# Patient Record
Sex: Male | Born: 1935 | Race: Black or African American | Hispanic: No | Smoking: Former smoker
Health system: Southern US, Community
[De-identification: ages and names within clinical notes are randomized; demographics above are authoritative.]

## PROBLEM LIST (undated history)

## (undated) DIAGNOSIS — G5600 Carpal tunnel syndrome, unspecified upper limb: Secondary | ICD-10-CM

## (undated) DIAGNOSIS — I5022 Chronic systolic (congestive) heart failure: Secondary | ICD-10-CM

## (undated) DIAGNOSIS — Z8719 Personal history of other diseases of the digestive system: Secondary | ICD-10-CM

## (undated) DIAGNOSIS — Z8739 Personal history of other diseases of the musculoskeletal system and connective tissue: Secondary | ICD-10-CM

## (undated) DIAGNOSIS — E785 Hyperlipidemia, unspecified: Secondary | ICD-10-CM

## (undated) DIAGNOSIS — J309 Allergic rhinitis, unspecified: Secondary | ICD-10-CM

## (undated) DIAGNOSIS — J45909 Unspecified asthma, uncomplicated: Secondary | ICD-10-CM

## (undated) DIAGNOSIS — S43006A Unspecified dislocation of unspecified shoulder joint, initial encounter: Secondary | ICD-10-CM

## (undated) DIAGNOSIS — I509 Heart failure, unspecified: Secondary | ICD-10-CM

## (undated) DIAGNOSIS — I10 Essential (primary) hypertension: Secondary | ICD-10-CM

## (undated) DIAGNOSIS — C7951 Secondary malignant neoplasm of bone: Secondary | ICD-10-CM

## (undated) DIAGNOSIS — E669 Obesity, unspecified: Secondary | ICD-10-CM

## (undated) DIAGNOSIS — Y92009 Unspecified place in unspecified non-institutional (private) residence as the place of occurrence of the external cause: Secondary | ICD-10-CM

## (undated) DIAGNOSIS — M1990 Unspecified osteoarthritis, unspecified site: Secondary | ICD-10-CM

## (undated) DIAGNOSIS — Z978 Presence of other specified devices: Secondary | ICD-10-CM

## (undated) DIAGNOSIS — I2510 Atherosclerotic heart disease of native coronary artery without angina pectoris: Secondary | ICD-10-CM

## (undated) DIAGNOSIS — C61 Malignant neoplasm of prostate: Secondary | ICD-10-CM

## (undated) DIAGNOSIS — E119 Type 2 diabetes mellitus without complications: Secondary | ICD-10-CM

## (undated) DIAGNOSIS — N183 Chronic kidney disease, stage 3 unspecified: Secondary | ICD-10-CM

## (undated) DIAGNOSIS — K760 Fatty (change of) liver, not elsewhere classified: Secondary | ICD-10-CM

## (undated) DIAGNOSIS — W19XXXA Unspecified fall, initial encounter: Secondary | ICD-10-CM

## (undated) DIAGNOSIS — D649 Anemia, unspecified: Secondary | ICD-10-CM

## (undated) DIAGNOSIS — I219 Acute myocardial infarction, unspecified: Secondary | ICD-10-CM

## (undated) DIAGNOSIS — Z960 Presence of urogenital implants: Secondary | ICD-10-CM

## (undated) HISTORY — DX: Essential (primary) hypertension: I10

## (undated) HISTORY — DX: Hyperlipidemia, unspecified: E78.5

## (undated) HISTORY — DX: Unspecified dislocation of unspecified shoulder joint, initial encounter: S43.006A

## (undated) HISTORY — DX: Chronic systolic (congestive) heart failure: I50.22

## (undated) HISTORY — DX: Fatty (change of) liver, not elsewhere classified: K76.0

## (undated) HISTORY — DX: Atherosclerotic heart disease of native coronary artery without angina pectoris: I25.10

## (undated) HISTORY — DX: Allergic rhinitis, unspecified: J30.9

## (undated) HISTORY — PX: TRANSURETHRAL RESECTION OF PROSTATE: SHX73

## (undated) HISTORY — DX: Obesity, unspecified: E66.9

## (undated) HISTORY — DX: Unspecified osteoarthritis, unspecified site: M19.90

## (undated) HISTORY — PX: CATARACT EXTRACTION W/ INTRAOCULAR LENS  IMPLANT, BILATERAL: SHX1307

## (undated) HISTORY — DX: Carpal tunnel syndrome, unspecified upper limb: G56.00

---

## 1968-11-28 DIAGNOSIS — S43006A Unspecified dislocation of unspecified shoulder joint, initial encounter: Secondary | ICD-10-CM

## 1968-11-28 HISTORY — DX: Unspecified dislocation of unspecified shoulder joint, initial encounter: S43.006A

## 1972-11-28 HISTORY — PX: NERVE REPAIR: SHX2083

## 2002-11-08 ENCOUNTER — Encounter: Admission: RE | Admit: 2002-11-08 | Discharge: 2002-11-08 | Payer: Self-pay | Admitting: Family Medicine

## 2002-11-08 ENCOUNTER — Encounter: Payer: Self-pay | Admitting: Family Medicine

## 2002-11-13 ENCOUNTER — Encounter: Admission: RE | Admit: 2002-11-13 | Discharge: 2002-12-12 | Payer: Self-pay | Admitting: Family Medicine

## 2005-01-20 ENCOUNTER — Encounter: Admission: RE | Admit: 2005-01-20 | Discharge: 2005-04-20 | Payer: Self-pay | Admitting: Family Medicine

## 2005-02-07 ENCOUNTER — Ambulatory Visit (HOSPITAL_COMMUNITY): Admission: RE | Admit: 2005-02-07 | Discharge: 2005-02-07 | Payer: Self-pay | Admitting: Family Medicine

## 2005-11-28 DIAGNOSIS — I219 Acute myocardial infarction, unspecified: Secondary | ICD-10-CM

## 2005-11-28 HISTORY — DX: Acute myocardial infarction, unspecified: I21.9

## 2006-07-07 ENCOUNTER — Ambulatory Visit: Payer: Self-pay | Admitting: Cardiology

## 2006-07-07 ENCOUNTER — Inpatient Hospital Stay (HOSPITAL_COMMUNITY): Admission: EM | Admit: 2006-07-07 | Discharge: 2006-07-13 | Payer: Self-pay | Admitting: Emergency Medicine

## 2006-07-07 DIAGNOSIS — I252 Old myocardial infarction: Secondary | ICD-10-CM

## 2006-07-08 ENCOUNTER — Encounter: Payer: Self-pay | Admitting: Cardiology

## 2006-07-11 ENCOUNTER — Ambulatory Visit: Payer: Self-pay | Admitting: Gastroenterology

## 2006-07-14 ENCOUNTER — Ambulatory Visit: Payer: Self-pay | Admitting: Internal Medicine

## 2006-07-14 ENCOUNTER — Inpatient Hospital Stay (HOSPITAL_COMMUNITY): Admission: EM | Admit: 2006-07-14 | Discharge: 2006-07-22 | Payer: Self-pay | Admitting: Emergency Medicine

## 2006-07-14 ENCOUNTER — Ambulatory Visit: Payer: Self-pay | Admitting: Cardiology

## 2006-07-15 ENCOUNTER — Encounter (INDEPENDENT_AMBULATORY_CARE_PROVIDER_SITE_OTHER): Payer: Self-pay | Admitting: *Deleted

## 2006-07-18 ENCOUNTER — Ambulatory Visit: Payer: Self-pay | Admitting: Oncology

## 2006-07-27 ENCOUNTER — Ambulatory Visit: Payer: Self-pay | Admitting: Cardiology

## 2006-08-14 ENCOUNTER — Ambulatory Visit: Payer: Self-pay

## 2006-08-15 ENCOUNTER — Ambulatory Visit: Payer: Self-pay | Admitting: Gastroenterology

## 2006-09-01 ENCOUNTER — Ambulatory Visit: Payer: Self-pay | Admitting: Gastroenterology

## 2006-11-08 ENCOUNTER — Encounter (INDEPENDENT_AMBULATORY_CARE_PROVIDER_SITE_OTHER): Payer: Self-pay | Admitting: *Deleted

## 2006-11-08 ENCOUNTER — Inpatient Hospital Stay (HOSPITAL_COMMUNITY): Admission: RE | Admit: 2006-11-08 | Discharge: 2006-11-10 | Payer: Self-pay | Admitting: Urology

## 2006-11-23 ENCOUNTER — Ambulatory Visit (HOSPITAL_COMMUNITY): Admission: RE | Admit: 2006-11-23 | Discharge: 2006-11-23 | Payer: Self-pay | Admitting: Urology

## 2006-12-12 ENCOUNTER — Ambulatory Visit: Admission: RE | Admit: 2006-12-12 | Discharge: 2007-03-01 | Payer: Self-pay | Admitting: Radiation Oncology

## 2007-03-22 ENCOUNTER — Ambulatory Visit: Admission: RE | Admit: 2007-03-22 | Discharge: 2007-06-13 | Payer: Self-pay | Admitting: Radiation Oncology

## 2007-07-03 ENCOUNTER — Ambulatory Visit: Admission: RE | Admit: 2007-07-03 | Discharge: 2007-08-21 | Payer: Self-pay | Admitting: Radiation Oncology

## 2008-01-10 ENCOUNTER — Ambulatory Visit: Payer: Self-pay | Admitting: Cardiology

## 2008-02-07 ENCOUNTER — Encounter: Admission: RE | Admit: 2008-02-07 | Discharge: 2008-02-07 | Payer: Self-pay | Admitting: Podiatry

## 2008-03-06 ENCOUNTER — Encounter: Admission: RE | Admit: 2008-03-06 | Discharge: 2008-03-06 | Payer: Self-pay | Admitting: Interventional Radiology

## 2008-09-04 ENCOUNTER — Ambulatory Visit (HOSPITAL_COMMUNITY): Admission: RE | Admit: 2008-09-04 | Discharge: 2008-09-04 | Payer: Self-pay | Admitting: Urology

## 2008-12-08 DIAGNOSIS — E119 Type 2 diabetes mellitus without complications: Secondary | ICD-10-CM

## 2008-12-08 DIAGNOSIS — E669 Obesity, unspecified: Secondary | ICD-10-CM

## 2008-12-08 DIAGNOSIS — I10 Essential (primary) hypertension: Secondary | ICD-10-CM

## 2008-12-08 DIAGNOSIS — E785 Hyperlipidemia, unspecified: Secondary | ICD-10-CM

## 2008-12-08 DIAGNOSIS — N259 Disorder resulting from impaired renal tubular function, unspecified: Secondary | ICD-10-CM | POA: Insufficient documentation

## 2008-12-08 HISTORY — DX: Essential (primary) hypertension: I10

## 2009-01-22 ENCOUNTER — Encounter (INDEPENDENT_AMBULATORY_CARE_PROVIDER_SITE_OTHER): Payer: Self-pay

## 2009-05-04 ENCOUNTER — Ambulatory Visit: Payer: Self-pay | Admitting: Oncology

## 2009-05-06 ENCOUNTER — Encounter: Payer: Self-pay | Admitting: Cardiology

## 2009-05-06 LAB — CBC WITH DIFFERENTIAL/PLATELET
Basophils Absolute: 0 10*3/uL (ref 0.0–0.1)
Eosinophils Absolute: 0.2 10*3/uL (ref 0.0–0.5)
HCT: 36.3 % — ABNORMAL LOW (ref 38.4–49.9)
HGB: 12.3 g/dL — ABNORMAL LOW (ref 13.0–17.1)
MONO#: 0.3 10*3/uL (ref 0.1–0.9)
NEUT%: 64.6 % (ref 39.0–75.0)
WBC: 5.1 10*3/uL (ref 4.0–10.3)
lymph#: 1.3 10*3/uL (ref 0.9–3.3)

## 2009-05-06 LAB — PSA: PSA: 9.1 ng/mL — ABNORMAL HIGH (ref 0.10–4.00)

## 2009-05-06 LAB — COMPREHENSIVE METABOLIC PANEL
ALT: 21 U/L (ref 0–53)
BUN: 38 mg/dL — ABNORMAL HIGH (ref 6–23)
CO2: 22 mEq/L (ref 19–32)
Calcium: 9.7 mg/dL (ref 8.4–10.5)
Chloride: 109 mEq/L (ref 96–112)
Creatinine, Ser: 1.94 mg/dL — ABNORMAL HIGH (ref 0.40–1.50)
Glucose, Bld: 210 mg/dL — ABNORMAL HIGH (ref 70–99)

## 2009-05-25 ENCOUNTER — Encounter (INDEPENDENT_AMBULATORY_CARE_PROVIDER_SITE_OTHER): Payer: Self-pay | Admitting: *Deleted

## 2009-07-10 ENCOUNTER — Ambulatory Visit: Payer: Self-pay | Admitting: Oncology

## 2009-07-14 ENCOUNTER — Telehealth: Payer: Self-pay | Admitting: Gastroenterology

## 2009-07-30 ENCOUNTER — Encounter: Payer: Self-pay | Admitting: Cardiology

## 2009-07-30 LAB — CBC WITH DIFFERENTIAL/PLATELET
BASO%: 0.6 % (ref 0.0–2.0)
Eosinophils Absolute: 0.2 10*3/uL (ref 0.0–0.5)
HCT: 35.1 % — ABNORMAL LOW (ref 38.4–49.9)
LYMPH%: 35.4 % (ref 14.0–49.0)
MCHC: 33.1 g/dL (ref 32.0–36.0)
MCV: 85.5 fL (ref 79.3–98.0)
MONO#: 0.4 10*3/uL (ref 0.1–0.9)
MONO%: 8 % (ref 0.0–14.0)
NEUT%: 52.4 % (ref 39.0–75.0)
Platelets: 203 10*3/uL (ref 140–400)
WBC: 5 10*3/uL (ref 4.0–10.3)

## 2009-07-30 LAB — PSA: PSA: 2.21 ng/mL (ref 0.10–4.00)

## 2009-07-30 LAB — COMPREHENSIVE METABOLIC PANEL
CO2: 20 mEq/L (ref 19–32)
Creatinine, Ser: 2.43 mg/dL — ABNORMAL HIGH (ref 0.40–1.50)
Glucose, Bld: 255 mg/dL — ABNORMAL HIGH (ref 70–99)
Total Bilirubin: 0.5 mg/dL (ref 0.3–1.2)

## 2009-10-27 ENCOUNTER — Ambulatory Visit: Payer: Self-pay | Admitting: Oncology

## 2009-11-24 ENCOUNTER — Encounter: Payer: Self-pay | Admitting: Gastroenterology

## 2009-12-07 ENCOUNTER — Ambulatory Visit: Payer: Self-pay | Admitting: Oncology

## 2009-12-14 ENCOUNTER — Telehealth: Payer: Self-pay | Admitting: Gastroenterology

## 2010-01-08 ENCOUNTER — Ambulatory Visit: Payer: Self-pay | Admitting: Oncology

## 2010-01-12 ENCOUNTER — Encounter: Payer: Self-pay | Admitting: Cardiology

## 2010-01-12 LAB — CBC WITH DIFFERENTIAL/PLATELET
Basophils Absolute: 0 10*3/uL (ref 0.0–0.1)
EOS%: 2.3 % (ref 0.0–7.0)
LYMPH%: 36.3 % (ref 14.0–49.0)
MCH: 27.8 pg (ref 27.2–33.4)
MCV: 84.9 fL (ref 79.3–98.0)
MONO%: 8 % (ref 0.0–14.0)
Platelets: 236 10*3/uL (ref 140–400)
RBC: 4.5 10*6/uL (ref 4.20–5.82)
RDW: 15.9 % — ABNORMAL HIGH (ref 11.0–14.6)

## 2010-01-12 LAB — COMPREHENSIVE METABOLIC PANEL
AST: 13 U/L (ref 0–37)
Albumin: 4.1 g/dL (ref 3.5–5.2)
Alkaline Phosphatase: 81 U/L (ref 39–117)
BUN: 36 mg/dL — ABNORMAL HIGH (ref 6–23)
Potassium: 4.9 mEq/L (ref 3.5–5.3)
Sodium: 140 mEq/L (ref 135–145)
Total Bilirubin: 0.5 mg/dL (ref 0.3–1.2)

## 2010-02-08 ENCOUNTER — Ambulatory Visit: Payer: Self-pay | Admitting: Oncology

## 2010-02-19 ENCOUNTER — Ambulatory Visit (HOSPITAL_COMMUNITY): Admission: RE | Admit: 2010-02-19 | Discharge: 2010-02-19 | Payer: Self-pay | Admitting: Oncology

## 2010-03-10 ENCOUNTER — Ambulatory Visit: Payer: Self-pay | Admitting: Oncology

## 2010-03-12 ENCOUNTER — Encounter: Payer: Self-pay | Admitting: Cardiology

## 2010-04-19 ENCOUNTER — Ambulatory Visit: Payer: Self-pay | Admitting: Internal Medicine

## 2010-04-19 ENCOUNTER — Inpatient Hospital Stay (HOSPITAL_COMMUNITY): Admission: EM | Admit: 2010-04-19 | Discharge: 2010-04-22 | Payer: Self-pay | Admitting: Emergency Medicine

## 2010-04-22 ENCOUNTER — Encounter (INDEPENDENT_AMBULATORY_CARE_PROVIDER_SITE_OTHER): Payer: Self-pay | Admitting: Internal Medicine

## 2010-05-27 ENCOUNTER — Ambulatory Visit: Payer: Self-pay | Admitting: Oncology

## 2010-07-02 ENCOUNTER — Ambulatory Visit: Payer: Self-pay | Admitting: Oncology

## 2010-07-06 ENCOUNTER — Encounter: Payer: Self-pay | Admitting: Cardiology

## 2010-07-06 LAB — COMPREHENSIVE METABOLIC PANEL
ALT: 14 U/L (ref 0–53)
AST: 16 U/L (ref 0–37)
Albumin: 4.2 g/dL (ref 3.5–5.2)
Calcium: 9.7 mg/dL (ref 8.4–10.5)
Chloride: 109 mEq/L (ref 96–112)
Potassium: 5 mEq/L (ref 3.5–5.3)
Sodium: 138 mEq/L (ref 135–145)
Total Protein: 6.6 g/dL (ref 6.0–8.3)

## 2010-07-06 LAB — CBC WITH DIFFERENTIAL/PLATELET
BASO%: 0.5 % (ref 0.0–2.0)
Basophils Absolute: 0 10*3/uL (ref 0.0–0.1)
EOS%: 4.7 % (ref 0.0–7.0)
HGB: 10.8 g/dL — ABNORMAL LOW (ref 13.0–17.1)
MCH: 27.8 pg (ref 27.2–33.4)
MCHC: 32.9 g/dL (ref 32.0–36.0)
RBC: 3.87 10*6/uL — ABNORMAL LOW (ref 4.20–5.82)
RDW: 16.3 % — ABNORMAL HIGH (ref 11.0–14.6)
lymph#: 2.1 10*3/uL (ref 0.9–3.3)

## 2010-08-30 ENCOUNTER — Inpatient Hospital Stay (HOSPITAL_COMMUNITY): Admission: EM | Admit: 2010-08-30 | Discharge: 2010-09-04 | Payer: Self-pay | Admitting: Emergency Medicine

## 2010-09-08 ENCOUNTER — Ambulatory Visit: Payer: Self-pay | Admitting: Oncology

## 2010-12-05 ENCOUNTER — Inpatient Hospital Stay (HOSPITAL_COMMUNITY)
Admission: EM | Admit: 2010-12-05 | Discharge: 2010-12-10 | Payer: Self-pay | Source: Home / Self Care | Attending: Internal Medicine | Admitting: Internal Medicine

## 2010-12-13 LAB — GLUCOSE, CAPILLARY
Glucose-Capillary: 300 mg/dL — ABNORMAL HIGH (ref 70–99)
Glucose-Capillary: 547 mg/dL — ABNORMAL HIGH (ref 70–99)
Glucose-Capillary: 517 mg/dL — ABNORMAL HIGH (ref 70–99)
Glucose-Capillary: 303 mg/dL — ABNORMAL HIGH (ref 70–99)
Glucose-Capillary: 132 mg/dL — ABNORMAL HIGH (ref 70–99)
Glucose-Capillary: 107 mg/dL — ABNORMAL HIGH (ref 70–99)
Glucose-Capillary: 183 mg/dL — ABNORMAL HIGH (ref 70–99)
Glucose-Capillary: 100 mg/dL — ABNORMAL HIGH (ref 70–99)
Glucose-Capillary: 182 mg/dL — ABNORMAL HIGH (ref 70–99)
Glucose-Capillary: 143 mg/dL — ABNORMAL HIGH (ref 70–99)
Glucose-Capillary: 179 mg/dL — ABNORMAL HIGH (ref 70–99)
Glucose-Capillary: 171 mg/dL — ABNORMAL HIGH (ref 70–99)
Glucose-Capillary: 259 mg/dL — ABNORMAL HIGH (ref 70–99)
Glucose-Capillary: 70 mg/dL (ref 70–99)
Glucose-Capillary: 269 mg/dL — ABNORMAL HIGH (ref 70–99)
Glucose-Capillary: 341 mg/dL — ABNORMAL HIGH (ref 70–99)
Glucose-Capillary: 402 mg/dL — ABNORMAL HIGH (ref 70–99)
Glucose-Capillary: 103 mg/dL — ABNORMAL HIGH (ref 70–99)
Glucose-Capillary: 222 mg/dL — ABNORMAL HIGH (ref 70–99)
Glucose-Capillary: 127 mg/dL — ABNORMAL HIGH (ref 70–99)
Glucose-Capillary: 356 mg/dL — ABNORMAL HIGH (ref 70–99)

## 2010-12-13 LAB — URINALYSIS, ROUTINE W REFLEX MICROSCOPIC
Bilirubin Urine: NEGATIVE
Ketones, ur: NEGATIVE mg/dL
Leukocytes, UA: NEGATIVE
Nitrite: NEGATIVE
Protein, ur: NEGATIVE mg/dL
Specific Gravity, Urine: 1.014 (ref 1.005–1.030)
Urine Glucose, Fasting: NEGATIVE mg/dL
Urobilinogen, UA: 0.2 mg/dL (ref 0.0–1.0)
pH: 5 (ref 5.0–8.0)

## 2010-12-13 LAB — CBC
HCT: 27.2 % — ABNORMAL LOW (ref 39.0–52.0)
HCT: 26 % — ABNORMAL LOW (ref 39.0–52.0)
HCT: 27 % — ABNORMAL LOW (ref 39.0–52.0)
Hemoglobin: 8.8 g/dL — ABNORMAL LOW (ref 13.0–17.0)
Hemoglobin: 8.3 g/dL — ABNORMAL LOW (ref 13.0–17.0)
Hemoglobin: 8.5 g/dL — ABNORMAL LOW (ref 13.0–17.0)
MCH: 25.5 pg — ABNORMAL LOW (ref 26.0–34.0)
MCH: 24.9 pg — ABNORMAL LOW (ref 26.0–34.0)
MCH: 25.5 pg — ABNORMAL LOW (ref 26.0–34.0)
MCHC: 32.4 g/dL (ref 30.0–36.0)
MCHC: 31.9 g/dL (ref 30.0–36.0)
MCHC: 31.5 g/dL (ref 30.0–36.0)
MCV: 78.8 fL (ref 78.0–100.0)
MCV: 77.8 fL — ABNORMAL LOW (ref 78.0–100.0)
MCV: 81.1 fL (ref 78.0–100.0)
Platelets: 220 10*3/uL (ref 150–400)
Platelets: 212 10*3/uL (ref 150–400)
Platelets: 222 10*3/uL (ref 150–400)
RBC: 3.45 MIL/uL — ABNORMAL LOW (ref 4.22–5.81)
RBC: 3.34 MIL/uL — ABNORMAL LOW (ref 4.22–5.81)
RBC: 3.33 MIL/uL — ABNORMAL LOW (ref 4.22–5.81)
RDW: 18.2 % — ABNORMAL HIGH (ref 11.5–15.5)
RDW: 18 % — ABNORMAL HIGH (ref 11.5–15.5)
RDW: 18.2 % — ABNORMAL HIGH (ref 11.5–15.5)
WBC: 6.7 10*3/uL (ref 4.0–10.5)
WBC: 5.2 10*3/uL (ref 4.0–10.5)
WBC: 6.1 10*3/uL (ref 4.0–10.5)

## 2010-12-13 LAB — BASIC METABOLIC PANEL
BUN: 31 mg/dL — ABNORMAL HIGH (ref 6–23)
BUN: 38 mg/dL — ABNORMAL HIGH (ref 6–23)
CO2: 20 mEq/L (ref 19–32)
CO2: 17 mEq/L — ABNORMAL LOW (ref 19–32)
Calcium: 9.2 mg/dL (ref 8.4–10.5)
Calcium: 8.9 mg/dL (ref 8.4–10.5)
Chloride: 112 mEq/L (ref 96–112)
Chloride: 111 mEq/L (ref 96–112)
Creatinine, Ser: 3.95 mg/dL — ABNORMAL HIGH (ref 0.4–1.5)
Creatinine, Ser: 3.82 mg/dL — ABNORMAL HIGH (ref 0.4–1.5)
GFR calc Af Amer: 18 mL/min — ABNORMAL LOW (ref >60)
GFR calc Af Amer: 19 mL/min — ABNORMAL LOW (ref >60)
GFR calc non Af Amer: 15 mL/min — ABNORMAL LOW (ref >60)
GFR calc non Af Amer: 16 mL/min — ABNORMAL LOW (ref >60)
Glucose, Bld: 149 mg/dL — ABNORMAL HIGH (ref 70–99)
Glucose, Bld: 256 mg/dL — ABNORMAL HIGH (ref 70–99)
Potassium: 4.5 mEq/L (ref 3.5–5.1)
Potassium: 4.7 mEq/L (ref 3.5–5.1)
Sodium: 138 mEq/L (ref 135–145)
Sodium: 135 mEq/L (ref 135–145)

## 2010-12-13 LAB — URINALYSIS, MICROSCOPIC ONLY
Bilirubin Urine: NEGATIVE
Ketones, ur: NEGATIVE mg/dL
Nitrite: NEGATIVE
Protein, ur: NEGATIVE mg/dL
Specific Gravity, Urine: 1.016 (ref 1.005–1.030)
Urine Glucose, Fasting: 100 mg/dL — AB
Urobilinogen, UA: 0.2 mg/dL (ref 0.0–1.0)
pH: 5 (ref 5.0–8.0)

## 2010-12-13 LAB — RENAL FUNCTION PANEL
Albumin: 3 g/dL — ABNORMAL LOW (ref 3.5–5.2)
Albumin: 3 g/dL — ABNORMAL LOW (ref 3.5–5.2)
Albumin: 3 g/dL — ABNORMAL LOW (ref 3.5–5.2)
Albumin: 3 g/dL — ABNORMAL LOW (ref 3.5–5.2)
BUN: 44 mg/dL — ABNORMAL HIGH (ref 6–23)
BUN: 42 mg/dL — ABNORMAL HIGH (ref 6–23)
BUN: 42 mg/dL — ABNORMAL HIGH (ref 6–23)
BUN: 35 mg/dL — ABNORMAL HIGH (ref 6–23)
CO2: 22 mEq/L (ref 19–32)
CO2: 20 mEq/L (ref 19–32)
CO2: 19 mEq/L (ref 19–32)
CO2: 22 mEq/L (ref 19–32)
Calcium: 8.8 mg/dL (ref 8.4–10.5)
Calcium: 8.6 mg/dL (ref 8.4–10.5)
Calcium: 8.5 mg/dL (ref 8.4–10.5)
Calcium: 9.1 mg/dL (ref 8.4–10.5)
Chloride: 111 mEq/L (ref 96–112)
Chloride: 115 mEq/L — ABNORMAL HIGH (ref 96–112)
Chloride: 109 mEq/L (ref 96–112)
Chloride: 111 mEq/L (ref 96–112)
Creatinine, Ser: 4.06 mg/dL — ABNORMAL HIGH (ref 0.4–1.5)
Creatinine, Ser: 4.2 mg/dL — ABNORMAL HIGH (ref 0.4–1.5)
Creatinine, Ser: 3.7 mg/dL — ABNORMAL HIGH (ref 0.4–1.5)
Creatinine, Ser: 3.28 mg/dL — ABNORMAL HIGH (ref 0.4–1.5)
GFR calc Af Amer: 18 mL/min — ABNORMAL LOW (ref >60)
GFR calc Af Amer: 17 mL/min — ABNORMAL LOW (ref >60)
GFR calc Af Amer: 20 mL/min — ABNORMAL LOW (ref >60)
GFR calc Af Amer: 23 mL/min — ABNORMAL LOW (ref >60)
GFR calc non Af Amer: 15 mL/min — ABNORMAL LOW (ref >60)
GFR calc non Af Amer: 14 mL/min — ABNORMAL LOW (ref >60)
GFR calc non Af Amer: 16 mL/min — ABNORMAL LOW (ref >60)
GFR calc non Af Amer: 19 mL/min — ABNORMAL LOW (ref >60)
Glucose, Bld: 97 mg/dL (ref 70–99)
Glucose, Bld: 186 mg/dL — ABNORMAL HIGH (ref 70–99)
Glucose, Bld: 368 mg/dL — ABNORMAL HIGH (ref 70–99)
Glucose, Bld: 153 mg/dL — ABNORMAL HIGH (ref 70–99)
Phosphorus: 4.1 mg/dL (ref 2.3–4.6)
Phosphorus: 4.2 mg/dL (ref 2.3–4.6)
Phosphorus: 3.8 mg/dL (ref 2.3–4.6)
Phosphorus: 3.4 mg/dL (ref 2.3–4.6)
Potassium: 4.6 mEq/L (ref 3.5–5.1)
Potassium: 5.2 mEq/L — ABNORMAL HIGH (ref 3.5–5.1)
Potassium: 5.9 mEq/L — ABNORMAL HIGH (ref 3.5–5.1)
Potassium: 5.3 mEq/L — ABNORMAL HIGH (ref 3.5–5.1)
Sodium: 141 mEq/L (ref 135–145)
Sodium: 141 mEq/L (ref 135–145)
Sodium: 132 mEq/L — ABNORMAL LOW (ref 135–145)
Sodium: 141 mEq/L (ref 135–145)

## 2010-12-13 LAB — PROTEIN ELECTROPH W RFLX QUANT IMMUNOGLOBULINS
Albumin ELP: 54.4 % — ABNORMAL LOW (ref 55.8–66.1)
Alpha-1-Globulin: 6.7 % — ABNORMAL HIGH (ref 2.9–4.9)
Alpha-2-Globulin: 13.4 % — ABNORMAL HIGH (ref 7.1–11.8)
Beta 2: 6.2 % (ref 3.2–6.5)
Beta Globulin: 6.3 % (ref 4.7–7.2)
Gamma Globulin: 13 % (ref 11.1–18.8)
M-Spike, %: NOT DETECTED g/dL
Total Protein ELP: 6.2 g/dL (ref 6.0–8.3)

## 2010-12-13 LAB — COMPREHENSIVE METABOLIC PANEL
ALT: 13 U/L (ref 0–53)
AST: 21 U/L (ref 0–37)
Albumin: 3.4 g/dL — ABNORMAL LOW (ref 3.5–5.2)
Alkaline Phosphatase: 56 U/L (ref 39–117)
BUN: 32 mg/dL — ABNORMAL HIGH (ref 6–23)
CO2: 16 mEq/L — ABNORMAL LOW (ref 19–32)
Calcium: 9 mg/dL (ref 8.4–10.5)
Chloride: 106 mEq/L (ref 96–112)
Creatinine, Ser: 3.92 mg/dL — ABNORMAL HIGH (ref 0.4–1.5)
GFR calc Af Amer: 18 mL/min — ABNORMAL LOW (ref >60)
GFR calc non Af Amer: 15 mL/min — ABNORMAL LOW (ref >60)
Glucose, Bld: 369 mg/dL — ABNORMAL HIGH (ref 70–99)
Potassium: 4.9 mEq/L (ref 3.5–5.1)
Sodium: 133 mEq/L — ABNORMAL LOW (ref 135–145)
Total Bilirubin: 0.7 mg/dL (ref 0.3–1.2)
Total Protein: 6.8 g/dL (ref 6.0–8.3)

## 2010-12-13 LAB — URINE CULTURE
Colony Count: NO GROWTH
Culture  Setup Time: 201201120124
Culture: NO GROWTH

## 2010-12-13 LAB — DIFFERENTIAL
Basophils Absolute: 0 10*3/uL (ref 0.0–0.1)
Basophils Relative: 0 % (ref 0–1)
Eosinophils Absolute: 0.1 10*3/uL (ref 0.0–0.7)
Eosinophils Relative: 1 % (ref 0–5)
Lymphocytes Relative: 13 % (ref 12–46)
Lymphs Abs: 0.9 10*3/uL (ref 0.7–4.0)
Monocytes Absolute: 0.4 10*3/uL (ref 0.1–1.0)
Monocytes Relative: 6 % (ref 3–12)
Neutro Abs: 5.3 10*3/uL (ref 1.7–7.7)
Neutrophils Relative %: 79 % — ABNORMAL HIGH (ref 43–77)

## 2010-12-13 LAB — IRON AND TIBC
Iron: <10 ug/dL — ABNORMAL LOW (ref 42–135)
Iron: 16 ug/dL — ABNORMAL LOW (ref 42–135)
Saturation Ratios: 6 % — ABNORMAL LOW (ref 20–55)
TIBC: 272 ug/dL (ref 215–435)
UIBC: 323 ug/dL
UIBC: 256 ug/dL

## 2010-12-13 LAB — LACTIC ACID, PLASMA: Lactic Acid, Venous: 2.8 mmol/L — ABNORMAL HIGH (ref 0.5–2.2)

## 2010-12-13 LAB — GLUCOSE, RANDOM: Glucose, Bld: 604 mg/dL (ref 70–99)

## 2010-12-13 LAB — HEMOGLOBIN A1C
Hgb A1c MFr Bld: 7.5 % — ABNORMAL HIGH (ref <5.7)
Mean Plasma Glucose: 169 mg/dL — ABNORMAL HIGH (ref <117)

## 2010-12-13 LAB — SODIUM, URINE, RANDOM: Sodium, Ur: 49 mEq/L

## 2010-12-13 LAB — POCT CARDIAC MARKERS
CKMB, poc: 3 ng/mL (ref 1.0–8.0)
Myoglobin, poc: 202 ng/mL (ref 12–200)
Troponin i, poc: <0.05 ng/mL (ref 0.00–0.09)

## 2010-12-13 LAB — VITAMIN B12: Vitamin B-12: 296 pg/mL (ref 211–911)

## 2010-12-13 LAB — RETICULOCYTES
RBC.: 3.58 MIL/uL — ABNORMAL LOW (ref 4.22–5.81)
Retic Count, Absolute: 39.4 10*3/uL (ref 19.0–186.0)
Retic Ct Pct: 1.1 % (ref 0.4–3.1)

## 2010-12-13 LAB — URINE MICROSCOPIC-ADD ON

## 2010-12-13 LAB — FERRITIN: Ferritin: 54 ng/mL (ref 22–322)

## 2010-12-13 LAB — CREATININE, URINE, RANDOM: Creatinine, Urine: 127.4 mg/dL

## 2010-12-13 LAB — BRAIN NATRIURETIC PEPTIDE: Pro B Natriuretic peptide (BNP): 1458 pg/mL — ABNORMAL HIGH (ref 0.0–100.0)

## 2010-12-13 LAB — OCCULT BLOOD, POC DEVICE: Fecal Occult Bld: POSITIVE

## 2010-12-13 LAB — PROCALCITONIN: Procalcitonin: 0.13 ng/mL

## 2010-12-13 LAB — MAGNESIUM: Magnesium: 1.7 mg/dL (ref 1.5–2.5)

## 2010-12-13 LAB — FOLATE: Folate: 9 ng/mL

## 2010-12-16 NOTE — Consult Note (Signed)
[name], [name]              ACCOUNT NO.:  [id number]  MEDICAL RECORD NO.:  [id number]          PATIENT TYPE:  INP  LOCATION:  3704                         FACILITY:  [facility]  PHYSICIAN:  Mindi Slicker. Lowell Guitar, M.D.  DATE OF BIRTH:  [birth year]  DATE OF CONSULTATION:  12/06/2010 DATE OF DISCHARGE:                                CONSULTATION   I was asked by Dr. Darnelle Catalan to see this 76 year old male with stage III chronic kidney disease, history of prostate cancer, followed by Dr. Retta Diones, status post TURP and on hormonal manipulation which is ongoing.  The patient missed his recent hormonal injection.  Of note, the patient presented in August 2007 with urinary retention, bilateral hydronephrosis, and acute renal failure with serum creatinine of protein 4 mg/dL.  Serum creatinine was 2.88 mg/dL on August 31, 2010.  He presents now with vague complaints of shortness of breath and malaise. A chest x-ray showed increased interstitial markings consistent with pulmonary edema and BUN was 31 and creatinine 3.95.  Of note, the patient reports slow urinary stream and nocturia times many which has increased in severity.  He also complains of dribbling upon initiation of his stream.  Renal ultrasound today returned showing bilateral cortical thinning, bilateral hydronephrosis, and bladder distention with prostatic enlargement.  PAST MEDICAL HISTORY:  Remarkable for: 1. Hypertension. 2. Gout. 3. Diabetes mellitus. 4. Coronary artery disease status post myocardial infarction in 2007. 5. Stage III chronic kidney disease. 6. Dyslipidemia. 7. Anemia. 8. Degenerative joint disease. 9. Carpal tunnel syndrome. 10.Obesity. 11.Prostate cancer status post TURP and hormonal therapy.  SOCIAL HISTORY:  The patient is divorced.  Prior cigarette smoker. Occasional alcohol drinker and previously employed as a Naval architect.  FAMILY HISTORY:  Remarkable for a brother with end-stage renal disease.  REVIEW  OF SYSTEMS:  Not contributory except for as described above.  CURRENT MEDICATIONS:  Allopurinol, Casodex, Uloric, Fergon, hydralazine, insulin, metoprolol, Avelox, and Protonix.  PHYSICAL EXAMINATION:  VITAL SIGNS:  Blood pressure is 127/84.  The patient is afebrile. GENERAL:  He is a pleasant African American male. HEENT:  Atraumatic and normocephalic.  Extraocular movements are intact. LUNGS:  Clear. HEART:  Regular rate and rhythm. ABDOMEN:  Protuberant.  There is dullness to percussion the lower abdomen with probable distention of the bladder. EXTREMITIES:  Trace 1+ edema bilaterally. NEUROLOGIC:  No focality.  Urinalysis:  Specific gravity 1.014, pH 5.0, no protein, and 0-2 red blood cells.  Sodium 135, potassium 4.7, chloride 111, CO2 of 17, BUN 38, and creatinine 3.82.  Hemoglobin 8.3, MCV 77.8, white blood count 200, platelets 212,000.  RENAL ASSESSMENT: 1. Acute renal failure secondary to obstructive uropathy secondary to     prostate cancer (most likely). 2. Stage III chronic kidney disease, probably secondary to prior     obstructive uropathy and nephrosclerosis. 3. Bilateral hydronephrosis secondary to bladder outlet obstruction,     most likely due to prostatic cancer.  RECOMMENDATIONS:  Urology consult.  The patient is refusing Foley, but we will request placement.          ______________________________ Mindi Slicker. Lowell Guitar, M.D.     ACP/MEDQ  D:  12/06/2010  T:  12/07/2010  Job:  [id number]  Electronically Signed by Casimiro Needle M.D. on 12/16/2010 02:48:22 PM

## 2010-12-19 ENCOUNTER — Encounter: Payer: Self-pay | Admitting: Podiatry

## 2010-12-19 ENCOUNTER — Encounter: Payer: Self-pay | Admitting: Interventional Radiology

## 2010-12-19 ENCOUNTER — Encounter: Payer: Self-pay | Admitting: Oncology

## 2010-12-21 NOTE — Consult Note (Signed)
  NAME:  [name], [name]              ACCOUNT NO.:  [id number]  MEDICAL RECORD NO.:  [id number]          PATIENT TYPE:  INP  LOCATION:  3704                         FACILITY:  [facility]  PHYSICIAN:  Sigmund I. Patsi Sears, M.D.DATE OF BIRTH:  08-12-1935  DATE OF CONSULTATION: DATE OF DISCHARGE:                                CONSULTATION   HISTORY OF PRESENT ILLNESS:  [name] is a 76 year old male admitted on December 05, 2010, with progressive dyspnea and nonproductive cough. He has increased fatigue as well.  He has a past medical history of prostate adenocarcinoma treated with hormone therapy.  He is status post TURP in the past.  The patient has missed recent hormone injections, however.  In August 2007, the patient had urinary retention, bilateral hydronephrosis, and acute renal failure with a serum creatinine of 4. Serum creatinine was 2.88 in October 2011.  Chest x-ray showed pulmonary edema, with BUN 31 and creatinine 3.95.  The patient does report slow urinary stream, nocturia times many, and this has increased in severity. He complains of dribbling upon urination.  Renal ultrasound on admission showed bilateral cortical thinning, bilateral hydronephrosis, and bladder distention with prostatic enlargement.  He is now for urologic consultation and catheterization.  PAST MEDICAL HISTORY: 1. Prostate cancer. 2. Hypertension. 3. Gout. 4. Diabetes. 5. Coronary artery disease. 6. Anemia. 7. DJD. 8. Carpal tunnel. 9. Obesity.  SOCIAL HISTORY:  The patient is divorced.  He is a prior cigarette smoker.  Alcohol is occasional only.  He has retired as a Naval architect.  FAMILY HISTORY:  Significant for brother with end-stage renal disease. Current review of systems is otherwise noncontributory.  The patient is nonverbal at the time of evaluation.  CURRENT MEDICATIONS: 1. Allopurinol. 2. Casodex. 3. Uloric. 4. Fergon. 5. Hydralazine. 6. Insulin. 7. Metoprolol. 8.  Avelox. 9. Protonix.  PHYSICAL EXAMINATION:  GENERAL:  Obese African American male in no acute distress. VITAL SIGNS:  Blood pressure 124/84. ABDOMEN:  Distended.  I cannot palpate his bladder, however. GENITOURINARY:  Normal uncircumcised penis.  Testicles are descended bilaterally.  There was no edema in the scrotum. EXTREMITIES:  No cyanosis and no edema.  IMPRESSION:  Urinary retention with history of prostate cancer.  He is post TURP in the past, may have bladder neck contracture.  He will need to have Foley catheter placed.  Of note, creatinine is 3.8.     Sigmund I. Patsi Sears, M.D.     SIT/MEDQ  D:  12/08/2010  T:  12/09/2010  Job:  [id number]  cc:   Dr. Carrolyn Leigh C. Lowell Guitar, M.D. Dr. Tanya Nones  Electronically Signed by Jethro Bolus M.D. on 12/21/2010 01:43:36 PM

## 2010-12-21 NOTE — Op Note (Signed)
  NAME:  [name], [name]              ACCOUNT NO.:  [id number]  MEDICAL RECORD NO.:  [id number]          PATIENT TYPE:  INP  LOCATION:  3704                         FACILITY:  [facility]  PHYSICIAN:  Sigmund I. Patsi Sears, M.D.DATE OF BIRTH:  14-May-1935  DATE OF PROCEDURE:  12/08/2010 DATE OF DISCHARGE:                              OPERATIVE REPORT   PREOPERATIVE DIAGNOSIS:  Acute urinary retention.  POSTOPERATIVE DIAGNOSIS:  Acute urinary retention.  OPERATION:  Flexible bedside cystoscopy, placement of 16 Council catheter with greater than 1000 mL residual.  SURGEON:  Sigmund I. Patsi Sears, MD  ANESTHESIA:  IV sedation with 2 mg IV morphine and Xylocaine jelly.  PREPARATION:  Following the patient's consent, IV morphine was given, and Xylocaine jelly was placed in urethra after prepping the penis with Betadine solution.  A flexible cystoscope was passed into the urethra, and urethral meatus was noted to be normal.  The patient is uncircumcised.  The mid urethra was also normal.  The prostatic urethra appeared to be obstructed.  There was a large amount of white tissue regrowth, consistent with recurrent prostate cancer, which is obstructing the bladder neck.  With the patient performing a Valsalva maneuver, I was able to manipulate the scope through the bladder neck contracture, and recurrent prostate cancer into the bladder.  The trigone appeared normal, and clear efflux was seen from both orifices. There was trabeculation but no cellules, and no evidence of bladder stone or diverticular formation.  A guidewire was passed in the bladder, and the scope was removed.  A 16 Council catheter was then placed over the guidewire into the bladder, and 10 mL placed in the balloon.  A 1000 mL of murky urine was obtained and sent for urinalysis and culture.  The patient should have Foley catheter left in place until the patient can be reevaluated by Dr. Retta Diones.  He will probably need to  have repeat TURP.     Sigmund I. Patsi Sears, M.D.     SIT/MEDQ  D:  12/08/2010  T:  12/09/2010  Job:  [id number]  cc:   Bertram Millard. Dahlstedt, M.D. Dr. Arthor Captain  Electronically Signed by Jethro Bolus M.D. on 12/21/2010 01:43:39 PM

## 2010-12-28 NOTE — Letter (Signed)
Summary: [facility] Note  [facility] Note   Imported By: Kassie Mends 04/15/2010 10:14:22  _____________________________________________________________________  External Attachment:    Type:   Image     Comment:   External Document

## 2010-12-28 NOTE — Progress Notes (Signed)
Summary: Schedule Colonoscopy--NEEDS OFFICE VISIT   Phone Note Outgoing Call Call back at [facility] Phone [phone]   Call placed by: Harlow Mares CMA Duncan Dull),  December 14, 2009 1:58 PM Call placed to: Patient Summary of Call: No Answer  Initial call taken by: Harlow Mares CMA Duncan Dull),  December 14, 2009 1:59 PM  Follow-up for Phone Call        No Answer Follow-up by: Harlow Mares CMA Duncan Dull),  December 21, 2009 4:38 PM  Additional Follow-up for Phone Call Additional follow up Details #1::        patient states that he lives alone and does not have a lot of gas money, plus he goes to the cancer center alot for appts. I advised him when he gets his next cancer center appt to call us back and we will try to get both appts a consult appt with Dr. Christella Hartigan on the same day. He does not seem to understand over the phone anything about the procedure or the physicans.  Additional Follow-up by: Harlow Mares CMA (AAMA),  December 30, 2009 11:45 AM

## 2010-12-28 NOTE — Letter (Signed)
[facility]   Imported By: Roderic Ovens 02/16/2010 14:50:34  _____________________________________________________________________  External Attachment:    Type:   Image     Comment:   External Document

## 2010-12-28 NOTE — Letter (Signed)
Summary: [facility]   [facility]   Imported By: Roderic Ovens 08/13/2010 10:21:11  _____________________________________________________________________  External Attachment:    Type:   Image     Comment:   External Document

## 2011-02-05 ENCOUNTER — Emergency Department (HOSPITAL_COMMUNITY)
Admission: EM | Admit: 2011-02-05 | Discharge: 2011-02-05 | Disposition: A | Discharge status: Home / Self Care | Payer: Medicare Other | Attending: Emergency Medicine | Admitting: Emergency Medicine

## 2011-02-05 DIAGNOSIS — I129 Hypertensive chronic kidney disease with stage 1 through stage 4 chronic kidney disease, or unspecified chronic kidney disease: Secondary | ICD-10-CM | POA: Insufficient documentation

## 2011-02-05 DIAGNOSIS — E1169 Type 2 diabetes mellitus with other specified complication: Secondary | ICD-10-CM | POA: Insufficient documentation

## 2011-02-05 DIAGNOSIS — N189 Chronic kidney disease, unspecified: Secondary | ICD-10-CM | POA: Insufficient documentation

## 2011-02-05 DIAGNOSIS — E875 Hyperkalemia: Secondary | ICD-10-CM | POA: Insufficient documentation

## 2011-02-05 LAB — URINE MICROSCOPIC-ADD ON

## 2011-02-05 LAB — URINALYSIS, ROUTINE W REFLEX MICROSCOPIC
Glucose, UA: NEGATIVE mg/dL
Ketones, ur: 15 mg/dL — AB
Protein, ur: >300 mg/dL — AB
Urobilinogen, UA: 1 mg/dL (ref 0.0–1.0)

## 2011-02-05 LAB — CBC
HCT: 30.4 % — ABNORMAL LOW (ref 39.0–52.0)
Hemoglobin: 9.6 g/dL — ABNORMAL LOW (ref 13.0–17.0)
MCH: 24.1 pg — ABNORMAL LOW (ref 26.0–34.0)
MCHC: 31.6 g/dL (ref 30.0–36.0)
MCV: 76.4 fL — ABNORMAL LOW (ref 78.0–100.0)
RBC: 3.98 MIL/uL — ABNORMAL LOW (ref 4.22–5.81)

## 2011-02-05 LAB — BASIC METABOLIC PANEL
BUN: 37 mg/dL — ABNORMAL HIGH (ref 6–23)
CO2: 23 mEq/L (ref 19–32)
Calcium: 9.3 mg/dL (ref 8.4–10.5)
Chloride: 105 mEq/L (ref 96–112)
Creatinine, Ser: 3.22 mg/dL — ABNORMAL HIGH (ref 0.4–1.5)
GFR calc Af Amer: 23 mL/min — ABNORMAL LOW (ref >60)
Glucose, Bld: 129 mg/dL — ABNORMAL HIGH (ref 70–99)

## 2011-02-05 LAB — DIFFERENTIAL
Basophils Relative: 0 % (ref 0–1)
Lymphs Abs: 1.3 10*3/uL (ref 0.7–4.0)
Monocytes Absolute: 0.6 10*3/uL (ref 0.1–1.0)
Monocytes Relative: 10 % (ref 3–12)
Neutro Abs: 3.7 10*3/uL (ref 1.7–7.7)
Neutrophils Relative %: 64 % (ref 43–77)

## 2011-02-05 LAB — POTASSIUM: Potassium: 5.3 mEq/L — ABNORMAL HIGH (ref 3.5–5.1)

## 2011-02-07 LAB — URINE CULTURE: Culture  Setup Time: 201203101735

## 2011-02-08 ENCOUNTER — Encounter: Payer: Self-pay | Admitting: Gastroenterology

## 2011-02-08 ENCOUNTER — Telehealth: Payer: Self-pay | Admitting: Gastroenterology

## 2011-02-09 ENCOUNTER — Ambulatory Visit: Payer: Medicare Other | Admitting: Gastroenterology

## 2011-02-10 LAB — GLUCOSE, CAPILLARY
Glucose-Capillary: 115 mg/dL — ABNORMAL HIGH (ref 70–99)
Glucose-Capillary: 120 mg/dL — ABNORMAL HIGH (ref 70–99)
Glucose-Capillary: 122 mg/dL — ABNORMAL HIGH (ref 70–99)
Glucose-Capillary: 142 mg/dL — ABNORMAL HIGH (ref 70–99)
Glucose-Capillary: 145 mg/dL — ABNORMAL HIGH (ref 70–99)
Glucose-Capillary: 153 mg/dL — ABNORMAL HIGH (ref 70–99)
Glucose-Capillary: 155 mg/dL — ABNORMAL HIGH (ref 70–99)
Glucose-Capillary: 156 mg/dL — ABNORMAL HIGH (ref 70–99)
Glucose-Capillary: 174 mg/dL — ABNORMAL HIGH (ref 70–99)
Glucose-Capillary: 239 mg/dL — ABNORMAL HIGH (ref 70–99)
Glucose-Capillary: 244 mg/dL — ABNORMAL HIGH (ref 70–99)
Glucose-Capillary: 256 mg/dL — ABNORMAL HIGH (ref 70–99)
Glucose-Capillary: 267 mg/dL — ABNORMAL HIGH (ref 70–99)
Glucose-Capillary: 272 mg/dL — ABNORMAL HIGH (ref 70–99)
Glucose-Capillary: 277 mg/dL — ABNORMAL HIGH (ref 70–99)
Glucose-Capillary: 284 mg/dL — ABNORMAL HIGH (ref 70–99)
Glucose-Capillary: 295 mg/dL — ABNORMAL HIGH (ref 70–99)
Glucose-Capillary: 300 mg/dL — ABNORMAL HIGH (ref 70–99)
Glucose-Capillary: 302 mg/dL — ABNORMAL HIGH (ref 70–99)
Glucose-Capillary: 310 mg/dL — ABNORMAL HIGH (ref 70–99)
Glucose-Capillary: 316 mg/dL — ABNORMAL HIGH (ref 70–99)
Glucose-Capillary: 320 mg/dL — ABNORMAL HIGH (ref 70–99)
Glucose-Capillary: 322 mg/dL — ABNORMAL HIGH (ref 70–99)
Glucose-Capillary: 329 mg/dL — ABNORMAL HIGH (ref 70–99)
Glucose-Capillary: 361 mg/dL — ABNORMAL HIGH (ref 70–99)
Glucose-Capillary: 379 mg/dL — ABNORMAL HIGH (ref 70–99)
Glucose-Capillary: 392 mg/dL — ABNORMAL HIGH (ref 70–99)
Glucose-Capillary: 395 mg/dL — ABNORMAL HIGH (ref 70–99)
Glucose-Capillary: 399 mg/dL — ABNORMAL HIGH (ref 70–99)
Glucose-Capillary: 416 mg/dL — ABNORMAL HIGH (ref 70–99)
Glucose-Capillary: 421 mg/dL — ABNORMAL HIGH (ref 70–99)
Glucose-Capillary: 509 mg/dL — ABNORMAL HIGH (ref 70–99)
Glucose-Capillary: 537 mg/dL — ABNORMAL HIGH (ref 70–99)
Glucose-Capillary: >600 mg/dL (ref 70–99)
Glucose-Capillary: >600 mg/dL (ref 70–99)

## 2011-02-10 LAB — CBC
HCT: 29.2 % — ABNORMAL LOW (ref 39.0–52.0)
HCT: 32.2 % — ABNORMAL LOW (ref 39.0–52.0)
Hemoglobin: 10.6 g/dL — ABNORMAL LOW (ref 13.0–17.0)
Hemoglobin: 9.8 g/dL — ABNORMAL LOW (ref 13.0–17.0)
MCH: 26.8 pg (ref 26.0–34.0)
MCH: 26.9 pg (ref 26.0–34.0)
MCHC: 33 g/dL (ref 30.0–36.0)
MCHC: 33.1 g/dL (ref 30.0–36.0)
MCV: 81.1 fL (ref 78.0–100.0)
MCV: 81.2 fL (ref 78.0–100.0)
MCV: 81.5 fL (ref 78.0–100.0)
Platelets: 267 10*3/uL (ref 150–400)
Platelets: 302 K/uL (ref 150–400)
RBC: 3.59 MIL/uL — ABNORMAL LOW (ref 4.22–5.81)
RBC: 3.97 MIL/uL — ABNORMAL LOW (ref 4.22–5.81)
RDW: 15.7 % — ABNORMAL HIGH (ref 11.5–15.5)
RDW: 15.8 % — ABNORMAL HIGH (ref 11.5–15.5)
WBC: 10.9 10*3/uL — ABNORMAL HIGH (ref 4.0–10.5)
WBC: 6.2 K/uL (ref 4.0–10.5)
WBC: 6.9 10*3/uL (ref 4.0–10.5)

## 2011-02-10 LAB — POCT I-STAT, CHEM 8
BUN: 21 mg/dL (ref 6–23)
Calcium, Ion: 1.24 mmol/L (ref 1.12–1.32)
Chloride: 104 mEq/L (ref 96–112)
Creatinine, Ser: 2.4 mg/dL — ABNORMAL HIGH (ref 0.4–1.5)
Glucose, Bld: 142 mg/dL — ABNORMAL HIGH (ref 70–99)
HCT: 36 % — ABNORMAL LOW (ref 39.0–52.0)
Hemoglobin: 12.2 g/dL — ABNORMAL LOW (ref 13.0–17.0)
Potassium: 4.3 meq/L (ref 3.5–5.1)
Sodium: 137 meq/L (ref 135–145)
TCO2: 25 mmol/L (ref 0–100)

## 2011-02-10 LAB — COMPREHENSIVE METABOLIC PANEL
Albumin: 2.8 g/dL — ABNORMAL LOW (ref 3.5–5.2)
BUN: 38 mg/dL — ABNORMAL HIGH (ref 6–23)
Calcium: 9.4 mg/dL (ref 8.4–10.5)
Creatinine, Ser: 2.88 mg/dL — ABNORMAL HIGH (ref 0.4–1.5)
Potassium: 5.2 mEq/L — ABNORMAL HIGH (ref 3.5–5.1)
Total Protein: 7 g/dL (ref 6.0–8.3)

## 2011-02-10 LAB — BASIC METABOLIC PANEL
BUN: 47 mg/dL — ABNORMAL HIGH (ref 6–23)
CO2: 23 mEq/L (ref 19–32)
Chloride: 104 mEq/L (ref 96–112)
Chloride: 96 mEq/L (ref 96–112)
GFR calc Af Amer: 27 mL/min — ABNORMAL LOW (ref >60)
GFR calc non Af Amer: 28 mL/min — ABNORMAL LOW (ref >60)
Potassium: 4.9 mEq/L (ref 3.5–5.1)
Sodium: 129 mEq/L — ABNORMAL LOW (ref 135–145)
Sodium: 134 mEq/L — ABNORMAL LOW (ref 135–145)

## 2011-02-10 LAB — DIFFERENTIAL
Basophils Absolute: 0 K/uL (ref 0.0–0.1)
Basophils Relative: 0 % (ref 0–1)
Eosinophils Absolute: 0.1 K/uL (ref 0.0–0.7)
Eosinophils Relative: 1 % (ref 0–5)
Lymphocytes Relative: 22 % (ref 12–46)
Lymphocytes Relative: 7 % — ABNORMAL LOW (ref 12–46)
Lymphs Abs: 0.5 10*3/uL — ABNORMAL LOW (ref 0.7–4.0)
Lymphs Abs: 1.4 K/uL (ref 0.7–4.0)
Monocytes Absolute: 0 10*3/uL — ABNORMAL LOW (ref 0.1–1.0)
Monocytes Absolute: 0.3 10*3/uL (ref 0.1–1.0)
Monocytes Relative: 0 % — ABNORMAL LOW (ref 3–12)
Monocytes Relative: 5 % (ref 3–12)
Neutro Abs: 4.4 10*3/uL (ref 1.7–7.7)
Neutro Abs: 6.3 10*3/uL (ref 1.7–7.7)
Neutrophils Relative %: 71 % (ref 43–77)
Neutrophils Relative %: 92 % — ABNORMAL HIGH (ref 43–77)

## 2011-02-10 LAB — PROTIME-INR
INR: 1.02 (ref 0.00–1.49)
Prothrombin Time: 13.6 seconds (ref 11.6–15.2)

## 2011-02-10 LAB — GLUCOSE, RANDOM: Glucose, Bld: 459 mg/dL — ABNORMAL HIGH (ref 70–99)

## 2011-02-10 LAB — APTT: aPTT: 38 seconds — ABNORMAL HIGH (ref 24–37)

## 2011-02-14 LAB — CBC
HCT: 30.7 % — ABNORMAL LOW (ref 39.0–52.0)
Hemoglobin: 11 g/dL — ABNORMAL LOW (ref 13.0–17.0)
Hemoglobin: 11.1 g/dL — ABNORMAL LOW (ref 13.0–17.0)
Hemoglobin: 9.9 g/dL — ABNORMAL LOW (ref 13.0–17.0)
MCHC: 32.4 g/dL (ref 30.0–36.0)
MCV: 85.8 fL (ref 78.0–100.0)
Platelets: 293 10*3/uL (ref 150–400)
RBC: 4.01 MIL/uL — ABNORMAL LOW (ref 4.22–5.81)
RDW: 15.1 % (ref 11.5–15.5)
RDW: 15.2 % (ref 11.5–15.5)
RDW: 15.4 % (ref 11.5–15.5)
WBC: 10.5 10*3/uL (ref 4.0–10.5)
WBC: 8.7 10*3/uL (ref 4.0–10.5)

## 2011-02-14 LAB — LIPID PANEL
HDL: 37 mg/dL — ABNORMAL LOW (ref >39)
LDL Cholesterol: 44 mg/dL (ref 0–99)
Total CHOL/HDL Ratio: 2.6 RATIO
Triglycerides: 76 mg/dL (ref <150)
VLDL: 15 mg/dL (ref 0–40)

## 2011-02-14 LAB — MAGNESIUM: Magnesium: 1.9 mg/dL (ref 1.5–2.5)

## 2011-02-14 LAB — GLUCOSE, CAPILLARY
Glucose-Capillary: 122 mg/dL — ABNORMAL HIGH (ref 70–99)
Glucose-Capillary: 211 mg/dL — ABNORMAL HIGH (ref 70–99)
Glucose-Capillary: 211 mg/dL — ABNORMAL HIGH (ref 70–99)
Glucose-Capillary: 244 mg/dL — ABNORMAL HIGH (ref 70–99)
Glucose-Capillary: 366 mg/dL — ABNORMAL HIGH (ref 70–99)
Glucose-Capillary: 399 mg/dL — ABNORMAL HIGH (ref 70–99)
Glucose-Capillary: 91 mg/dL (ref 70–99)

## 2011-02-14 LAB — BASIC METABOLIC PANEL
BUN: 31 mg/dL — ABNORMAL HIGH (ref 6–23)
CO2: 21 mEq/L (ref 19–32)
Calcium: 9.2 mg/dL (ref 8.4–10.5)
Chloride: 105 mEq/L (ref 96–112)
Creatinine, Ser: 1.94 mg/dL — ABNORMAL HIGH (ref 0.4–1.5)
GFR calc Af Amer: 38 mL/min — ABNORMAL LOW (ref >60)
GFR calc non Af Amer: 32 mL/min — ABNORMAL LOW (ref >60)
GFR calc non Af Amer: 34 mL/min — ABNORMAL LOW (ref >60)
Glucose, Bld: 208 mg/dL — ABNORMAL HIGH (ref 70–99)
Glucose, Bld: 370 mg/dL — ABNORMAL HIGH (ref 70–99)
Sodium: 136 mEq/L (ref 135–145)

## 2011-02-14 LAB — CK
Total CK: 81 U/L (ref 7–232)
Total CK: 82 U/L (ref 7–232)

## 2011-02-14 LAB — HEMOGLOBIN A1C: Mean Plasma Glucose: 243 mg/dL — ABNORMAL HIGH (ref <117)

## 2011-02-14 LAB — DIFFERENTIAL
Basophils Absolute: 0 10*3/uL (ref 0.0–0.1)
Basophils Relative: 0 % (ref 0–1)
Lymphocytes Relative: 16 % (ref 12–46)
Lymphs Abs: 1.4 10*3/uL (ref 0.7–4.0)
Lymphs Abs: 1.5 10*3/uL (ref 0.7–4.0)
Monocytes Absolute: 0.6 10*3/uL (ref 0.1–1.0)
Monocytes Relative: 10 % (ref 3–12)
Neutro Abs: 4.5 10*3/uL (ref 1.7–7.7)
Neutro Abs: 6.7 10*3/uL (ref 1.7–7.7)
Neutrophils Relative %: 66 % (ref 43–77)

## 2011-02-14 LAB — COMPREHENSIVE METABOLIC PANEL
Alkaline Phosphatase: 71 U/L (ref 39–117)
BUN: 29 mg/dL — ABNORMAL HIGH (ref 6–23)
Calcium: 9.2 mg/dL (ref 8.4–10.5)
Glucose, Bld: 152 mg/dL — ABNORMAL HIGH (ref 70–99)
Total Protein: 6.1 g/dL (ref 6.0–8.3)

## 2011-02-14 LAB — PROTIME-INR: INR: 1.11 (ref 0.00–1.49)

## 2011-02-14 LAB — POCT I-STAT, CHEM 8
BUN: 30 mg/dL — ABNORMAL HIGH (ref 6–23)
Chloride: 107 mEq/L (ref 96–112)
Sodium: 135 mEq/L (ref 135–145)

## 2011-02-14 LAB — URINALYSIS, ROUTINE W REFLEX MICROSCOPIC
Leukocytes, UA: NEGATIVE
Nitrite: NEGATIVE
Specific Gravity, Urine: 1.021 (ref 1.005–1.030)
Urobilinogen, UA: 1 mg/dL (ref 0.0–1.0)

## 2011-02-14 LAB — URIC ACID: Uric Acid, Serum: 6.2 mg/dL (ref 4.0–7.8)

## 2011-02-14 LAB — URINE MICROSCOPIC-ADD ON

## 2011-02-15 NOTE — Progress Notes (Signed)
Summary: Appt sooner than first available   Phone Note From Other Clinic   Caller: Dr Tanya Nones [phone]  Angelique Blonder or Selena Batten or referrals Call For: Dr Christella Hartigan Reason for Call: Schedule Patient Appt Summary of Call: Hemoglobin is 9 and would like pt seen before first available in May. Initial call taken by: Leanor Kail [facility],  February 08, 2011 3:55 PM  Follow-up for Phone Call        I called Melchor Amour and gave her a 845 appt for 02/09/11 with Dr Christella Hartigan, she called back and advised the pt could not get a ride so I offered her the 03/14/11 appt pt is aware and records were recieved. Follow-up by: Chales Abrahams CMA Duncan Dull),  February 08, 2011 4:18 PM

## 2011-02-15 NOTE — Letter (Signed)
Summary: New Patient letter  [facility]  [address] [place], [place] [address]   Phone: [phone]  Fax: [phone]       02/08/2011 MRN: [id number]  [facility] [address] [place], [place]  [address]  Dear [name],  Welcome to the Gastroenterology Division at [facility].    You are scheduled to see Dr.  Christella Hartigan on 03-14-11 at 8:45am on the 3rd floor at [facility], [address]  We ask that you try to arrive at our office 15 minutes prior to your appointment time to allow for check-in.  We would like you to complete the enclosed self-administered evaluation form prior to your visit and bring it with you on the day of your appointment.  We will review it with you.  Also, please bring a complete list of all your medications or, if you prefer, bring the medication bottles and we will list them.  Please bring your insurance card so that we may make a copy of it.  If your insurance requires a referral to see a specialist, please bring your referral form from your primary care physician.  Co-payments are due at the time of your visit and may be paid by cash, check or credit card.     Your office visit will consist of a consult with your physician (includes a physical exam), any laboratory testing he/she may order, scheduling of any necessary diagnostic testing (e.g. x-ray, ultrasound, CT-scan), and scheduling of a procedure (e.g. Endoscopy, Colonoscopy) if required.  Please allow enough time on your schedule to allow for any/all of these possibilities.    If you cannot keep your appointment, please call [phone] to cancel or reschedule prior to your appointment date.  This allows [place] the opportunity to schedule an appointment for another patient in need of care.  If you do not cancel or reschedule by 5 p.m. the business day prior to your appointment date, you will be charged a $50.00 late cancellation/no-show fee.    Thank you for  choosing [facility] for your medical needs.  We appreciate the opportunity to care for you.  Please visit [place] at our website  to learn more about our practice.                     Sincerely,                                                             The Gastroenterology Division

## 2011-02-20 ENCOUNTER — Emergency Department (HOSPITAL_COMMUNITY): Payer: Medicare Other

## 2011-02-20 ENCOUNTER — Inpatient Hospital Stay (HOSPITAL_COMMUNITY)
Admission: EM | Admit: 2011-02-20 | Discharge: 2011-02-24 | DRG: 683 | Disposition: A | Discharge status: Home / Self Care | Payer: Medicare Other | Attending: Internal Medicine | Admitting: Internal Medicine

## 2011-02-20 DIAGNOSIS — M109 Gout, unspecified: Secondary | ICD-10-CM | POA: Diagnosis present

## 2011-02-20 DIAGNOSIS — I252 Old myocardial infarction: Secondary | ICD-10-CM

## 2011-02-20 DIAGNOSIS — N320 Bladder-neck obstruction: Secondary | ICD-10-CM | POA: Diagnosis present

## 2011-02-20 DIAGNOSIS — E1169 Type 2 diabetes mellitus with other specified complication: Secondary | ICD-10-CM | POA: Diagnosis present

## 2011-02-20 DIAGNOSIS — C61 Malignant neoplasm of prostate: Secondary | ICD-10-CM | POA: Diagnosis present

## 2011-02-20 DIAGNOSIS — M13169 Monoarthritis, not elsewhere classified, unspecified knee: Secondary | ICD-10-CM | POA: Diagnosis present

## 2011-02-20 DIAGNOSIS — E785 Hyperlipidemia, unspecified: Secondary | ICD-10-CM | POA: Diagnosis present

## 2011-02-20 DIAGNOSIS — D509 Iron deficiency anemia, unspecified: Secondary | ICD-10-CM | POA: Diagnosis present

## 2011-02-20 DIAGNOSIS — R319 Hematuria, unspecified: Secondary | ICD-10-CM | POA: Diagnosis present

## 2011-02-20 DIAGNOSIS — C801 Malignant (primary) neoplasm, unspecified: Secondary | ICD-10-CM | POA: Diagnosis present

## 2011-02-20 DIAGNOSIS — R5381 Other malaise: Secondary | ICD-10-CM | POA: Diagnosis present

## 2011-02-20 DIAGNOSIS — N179 Acute kidney failure, unspecified: Principal | ICD-10-CM | POA: Diagnosis present

## 2011-02-20 DIAGNOSIS — T383X5A Adverse effect of insulin and oral hypoglycemic [antidiabetic] drugs, initial encounter: Secondary | ICD-10-CM | POA: Diagnosis present

## 2011-02-20 DIAGNOSIS — E860 Dehydration: Secondary | ICD-10-CM | POA: Diagnosis present

## 2011-02-20 DIAGNOSIS — I129 Hypertensive chronic kidney disease with stage 1 through stage 4 chronic kidney disease, or unspecified chronic kidney disease: Secondary | ICD-10-CM | POA: Diagnosis present

## 2011-02-20 DIAGNOSIS — N183 Chronic kidney disease, stage 3 unspecified: Secondary | ICD-10-CM | POA: Diagnosis present

## 2011-02-20 DIAGNOSIS — N390 Urinary tract infection, site not specified: Secondary | ICD-10-CM | POA: Diagnosis present

## 2011-02-20 DIAGNOSIS — Z79899 Other long term (current) drug therapy: Secondary | ICD-10-CM

## 2011-02-20 LAB — URINE MICROSCOPIC-ADD ON

## 2011-02-20 LAB — GLUCOSE, CAPILLARY
Glucose-Capillary: 136 mg/dL — ABNORMAL HIGH (ref 70–99)
Glucose-Capillary: 124 mg/dL — ABNORMAL HIGH (ref 70–99)
Glucose-Capillary: 137 mg/dL — ABNORMAL HIGH (ref 70–99)

## 2011-02-20 LAB — CK TOTAL AND CKMB (NOT AT ARMC): CK, MB: 2.3 ng/mL (ref 0.3–4.0)

## 2011-02-20 LAB — DIFFERENTIAL
Lymphocytes Relative: 20 % (ref 12–46)
Lymphs Abs: 1.2 10*3/uL (ref 0.7–4.0)
Monocytes Absolute: 0.5 10*3/uL (ref 0.1–1.0)
Monocytes Relative: 8 % (ref 3–12)
Neutro Abs: 4.1 10*3/uL (ref 1.7–7.7)

## 2011-02-20 LAB — CARDIAC PANEL(CRET KIN+CKTOT+MB+TROPI)
CK, MB: 2.2 ng/mL (ref 0.3–4.0)
CK, MB: 2.4 ng/mL (ref 0.3–4.0)
Total CK: 100 U/L (ref 7–232)
Total CK: 96 U/L (ref 7–232)

## 2011-02-20 LAB — POCT I-STAT, CHEM 8
BUN: 41 mg/dL — ABNORMAL HIGH (ref 6–23)
Creatinine, Ser: 3.1 mg/dL — ABNORMAL HIGH (ref 0.4–1.5)
Glucose, Bld: 94 mg/dL (ref 70–99)
Sodium: 140 mEq/L (ref 135–145)
TCO2: 21 mmol/L (ref 0–100)

## 2011-02-20 LAB — URINALYSIS, ROUTINE W REFLEX MICROSCOPIC
Nitrite: POSITIVE — AB
Specific Gravity, Urine: 1.015 (ref 1.005–1.030)
Urobilinogen, UA: 0.2 mg/dL (ref 0.0–1.0)

## 2011-02-20 LAB — CBC
HCT: 29.2 % — ABNORMAL LOW (ref 39.0–52.0)
Hemoglobin: 9.2 g/dL — ABNORMAL LOW (ref 13.0–17.0)
MCH: 24.1 pg — ABNORMAL LOW (ref 26.0–34.0)
MCHC: 31.5 g/dL (ref 30.0–36.0)
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
## 2011-02-21 LAB — COMPREHENSIVE METABOLIC PANEL
ALT: 11 U/L (ref 0–53)
AST: 14 U/L (ref 0–37)
Albumin: 2.7 g/dL — ABNORMAL LOW (ref 3.5–5.2)
CO2: 23 mEq/L (ref 19–32)
Calcium: 9.3 mg/dL (ref 8.4–10.5)
GFR calc Af Amer: 28 mL/min — ABNORMAL LOW (ref >60)
Sodium: 136 mEq/L (ref 135–145)
Total Protein: 5.6 g/dL — ABNORMAL LOW (ref 6.0–8.3)

## 2011-02-21 LAB — DIFFERENTIAL
Basophils Absolute: 0 10*3/uL (ref 0.0–0.1)
Eosinophils Absolute: 0.2 10*3/uL (ref 0.0–0.7)
Lymphs Abs: 1.5 10*3/uL (ref 0.7–4.0)
Monocytes Relative: 6 % (ref 3–12)

## 2011-02-21 LAB — HEMOGLOBIN AND HEMATOCRIT, BLOOD
HCT: 25.8 % — ABNORMAL LOW (ref 39.0–52.0)
Hemoglobin: 8.8 g/dL — ABNORMAL LOW (ref 13.0–17.0)

## 2011-02-21 LAB — CBC
Hemoglobin: 8.2 g/dL — ABNORMAL LOW (ref 13.0–17.0)
MCHC: 31.3 g/dL (ref 30.0–36.0)
RDW: 16.5 % — ABNORMAL HIGH (ref 11.5–15.5)

## 2011-02-21 LAB — ABO/RH: ABO/RH(D): O POS

## 2011-02-21 LAB — GLUCOSE, CAPILLARY: Glucose-Capillary: 77 mg/dL (ref 70–99)

## 2011-02-21 LAB — PSA: PSA: 20.84 ng/mL — ABNORMAL HIGH (ref <=4.00)

## 2011-02-22 LAB — COMPREHENSIVE METABOLIC PANEL
ALT: 11 U/L (ref 0–53)
AST: 14 U/L (ref 0–37)
Albumin: 2.9 g/dL — ABNORMAL LOW (ref 3.5–5.2)
CO2: 23 mEq/L (ref 19–32)
Calcium: 9.3 mg/dL (ref 8.4–10.5)
Creatinine, Ser: 2.79 mg/dL — ABNORMAL HIGH (ref 0.4–1.5)
GFR calc Af Amer: 27 mL/min — ABNORMAL LOW (ref >60)
GFR calc non Af Amer: 22 mL/min — ABNORMAL LOW (ref >60)
Sodium: 136 mEq/L (ref 135–145)
Total Protein: 6.2 g/dL (ref 6.0–8.3)

## 2011-02-22 LAB — DIFFERENTIAL
Basophils Absolute: 0 10*3/uL (ref 0.0–0.1)
Basophils Relative: 0 % (ref 0–1)
Eosinophils Absolute: 0.4 10*3/uL (ref 0.0–0.7)
Monocytes Absolute: 0.6 10*3/uL (ref 0.1–1.0)
Neutro Abs: 3.8 10*3/uL (ref 1.7–7.7)
Neutrophils Relative %: 56 % (ref 43–77)

## 2011-02-22 LAB — HEMOGLOBIN AND HEMATOCRIT, BLOOD
HCT: 29.4 % — ABNORMAL LOW (ref 39.0–52.0)
Hemoglobin: 8.9 g/dL — ABNORMAL LOW (ref 13.0–17.0)

## 2011-02-22 LAB — CROSSMATCH: ABO/RH(D): O POS

## 2011-02-22 LAB — GLUCOSE, CAPILLARY
Glucose-Capillary: 155 mg/dL — ABNORMAL HIGH (ref 70–99)
Glucose-Capillary: 133 mg/dL — ABNORMAL HIGH (ref 70–99)

## 2011-02-22 LAB — CBC
Hemoglobin: 9 g/dL — ABNORMAL LOW (ref 13.0–17.0)
MCH: 23.9 pg — ABNORMAL LOW (ref 26.0–34.0)
MCHC: 30.4 g/dL (ref 30.0–36.0)
Platelets: 330 10*3/uL (ref 150–400)
RBC: 3.77 MIL/uL — ABNORMAL LOW (ref 4.22–5.81)

## 2011-02-23 LAB — HEMOGLOBIN AND HEMATOCRIT, BLOOD
HCT: 30.4 % — ABNORMAL LOW (ref 39.0–52.0)
HCT: 30.6 % — ABNORMAL LOW (ref 39.0–52.0)
Hemoglobin: 9.5 g/dL — ABNORMAL LOW (ref 13.0–17.0)

## 2011-02-23 LAB — COMPREHENSIVE METABOLIC PANEL
AST: 16 U/L (ref 0–37)
Albumin: 3 g/dL — ABNORMAL LOW (ref 3.5–5.2)
BUN: 28 mg/dL — ABNORMAL HIGH (ref 6–23)
Calcium: 9.4 mg/dL (ref 8.4–10.5)
Chloride: 112 mEq/L (ref 96–112)
Creatinine, Ser: 2.44 mg/dL — ABNORMAL HIGH (ref 0.4–1.5)
GFR calc Af Amer: 32 mL/min — ABNORMAL LOW (ref >60)
GFR calc non Af Amer: 26 mL/min — ABNORMAL LOW (ref >60)
Total Bilirubin: 0.4 mg/dL (ref 0.3–1.2)

## 2011-02-23 LAB — CBC
MCH: 23.5 pg — ABNORMAL LOW (ref 26.0–34.0)
MCHC: 30.4 g/dL (ref 30.0–36.0)
MCV: 77.3 fL — ABNORMAL LOW (ref 78.0–100.0)
Platelets: 330 10*3/uL (ref 150–400)
RBC: 3.92 MIL/uL — ABNORMAL LOW (ref 4.22–5.81)

## 2011-02-23 LAB — DIFFERENTIAL
Basophils Relative: 1 % (ref 0–1)
Eosinophils Absolute: 0.4 10*3/uL (ref 0.0–0.7)
Eosinophils Relative: 6 % — ABNORMAL HIGH (ref 0–5)
Lymphs Abs: 2.1 10*3/uL (ref 0.7–4.0)
Monocytes Absolute: 0.5 10*3/uL (ref 0.1–1.0)
Monocytes Relative: 7 % (ref 3–12)
Neutrophils Relative %: 59 % (ref 43–77)

## 2011-02-23 LAB — FOLATE: Folate: 7 ng/mL

## 2011-02-23 LAB — IRON AND TIBC
Iron: 29 ug/dL — ABNORMAL LOW (ref 42–135)
TIBC: 278 ug/dL (ref 215–435)

## 2011-02-23 LAB — GLUCOSE, CAPILLARY
Glucose-Capillary: 132 mg/dL — ABNORMAL HIGH (ref 70–99)
Glucose-Capillary: 64 mg/dL — ABNORMAL LOW (ref 70–99)
Glucose-Capillary: 187 mg/dL — ABNORMAL HIGH (ref 70–99)
Glucose-Capillary: 105 mg/dL — ABNORMAL HIGH (ref 70–99)

## 2011-02-24 LAB — GLUCOSE, CAPILLARY
Glucose-Capillary: 97 mg/dL (ref 70–99)
Glucose-Capillary: 88 mg/dL (ref 70–99)

## 2011-02-24 LAB — URINE CULTURE: Culture  Setup Time: 201203251155

## 2011-02-24 LAB — HEMOGLOBIN AND HEMATOCRIT, BLOOD: Hemoglobin: 8.9 g/dL — ABNORMAL LOW (ref 13.0–17.0)

## 2011-02-25 NOTE — Discharge Summary (Signed)
NAME:  [name], [name]              ACCOUNT NO.:  [id number]  MEDICAL RECORD NO.:  [id number]           PATIENT TYPE:  I  LOCATION:  1505                         FACILITY:  [facility]  PHYSICIAN:  Marinda Elk, M.D.DATE OF BIRTH:  Mar 15, 1935  DATE OF ADMISSION:  02/20/2011 DATE OF DISCHARGE:  02/24/2011                              DISCHARGE SUMMARY   PRIMARY CARE DOCTOR:  Broadus John T. Pamalee Leyden, MD  UROLOGIST:  Bertram Millard. Dahlstedt, MD  DISCHARGE DIAGNOSES: 1. Hypoglycemia probably secondary to glipizide  and acute renal     failure. 2. Acute on chronic kidney disease secondary to obstructive uropathy     secondary to prostate cancer. 3. Microcytic anemia. 4. Hypertension. 5. Prostate cancer.  DISCHARGE MEDICATIONS: 1. Ferrous sulfate 325 mg p.o. t.i.d. 2. Amoxicillin 0.4 mg p.o. daily. 3. Actos 30 mg p.o. daily. 4. Allopurinol 100 mg 2 tablets p.o. daily. 5. Bicalutamide 50 mg daily. 6. Hydralazine 1 tab t.i.d. 7. Metoprolol 25 mg daily. 8. Uloric 80 mg daily.  PROCEDURES PERFORMED:  None.  BRIEF ADMITTING HISTORY AND PHYSICAL:  This is a 76 year old male with past medical history of diabetes, prior MI, obstructive uropathy, transurethral permanent bladder catheter, lives alone by himself, presents to the emergency room, complaining of dizzy spells and excessive sweating.  The patient claims that he had been stable health until recently an hour where he observed he was feeling dizzy and was gradually getting confused and these symptoms were getting worse.  He also has had profuse sweating.  Please relate to dictation from February 20, 2011, for further details.  PHYSICAL EXAMINATION:  VITAL SIGNS:  Blood pressure 162/72, pulse 74, respirations of 20, temperature 97.8. HEENT:  No pallor.  Pupils equal, round, and reactive to light. Extraocular movements intact. NECK:  Supple.  No JVD. LUNGS:  Good air movement.  Clear to auscultation. CARDIOVASCULAR:  He has regular  rate and rhythm.  Positive S1 and S2. ABDOMEN:  Soft, nontender, indwelling catheter.  Liver, spleen, kidneys are not palpable.  Bowel sounds are positive. EXTREMITIES:  Showed pedal pulses.  No edema. NEUROLOGICAL:  Nonfocal. SKIN:  No rashes.  Labs on admission shows a hemoglobin of 10, sodium 140, potassium 4.3, chloride 111, glucose of 94, BUN of 41, creatinine of 3.1.  UA showed leukocyte esterase and positive for blood.  Microscopy 2150 red blood cells, too numerous red blood cells count, few bacteria.  X-ray done shows left knee which showed moderate tricompartmental DJD of the left knee with associated small suprapatellar effusion.  ASSESSMENT AND PLAN: 1. Hypoglycemia most probably secondary to oral medication and     worsening renal function.  His oral hypoglycemic agents, especially     glipizide was stopped after a third day after being in the     hospital.  Then his hypoglycemia resolved.  He will go home on     Actos.  For diabetes medication.  He is not a candidate for     metformin secondary to his renal failure.  He will follow up with     his primary care doctor. 2. Acute on chornic kidney disease III  secondary to obstructive uropathy.  He was     admitted to the floor.  He was given IV fluids and his Foley was     exchanged, his creatinine improved.  Urology was consulted.  They     recommended voiding trial.  He was started on Flomax.  He will     follow up with the Urology as an outpatient for this. 3. Microcytic anemia, hemoglobin is stable.  We will need a     colonoscopy as an outpatient. 4. Hypertension currently well controlled.  No changes were made. 5. History of prostate cancer.  He was continued on his medications.     He has never kept his followup appointment with Dr. Retta Diones.  Dr.     Retta Diones was consulted.  He is to follow up with him in 1 week.     It was discussed with patient and Dr. Retta Diones,  about his     treatment.  They have not agreed  the treatment at this time.  The     patient is to follow up with him in 1 week to determine further     management.  The patient is very noncompliance with his     appointments.  That is the main reason why Dr. Retta Diones was     consulted.  Vitals on discharge show temperature 99, pulse of 86, respirations of 20, blood pressure 147/73.  He was 794% on room air.  DISPOSITION:  He will follow up with his primary care doctor in 1 week here.  We will see how his hypoglycemic episodes are doing.  We will also check his blood pressure and titrate blood pressure medications as needed.  Follow up with Dr. Retta Diones in 1 week here.  We will see if he continues to need the Foley or whether further management needs to be done.     Marinda Elk, M.D.     AF/MEDQ  D:  02/24/2011  T:  02/25/2011  Job:  [id number]  cc:   Bertram Millard. Dahlstedt, M.D. Fax: [phone]  Priscille Heidelberg. Pamalee Leyden, MD  Electronically Signed by Lambert Keto M.D. on 02/25/2011 02:30:24 PM

## 2011-02-26 LAB — CULTURE, BLOOD (ROUTINE X 2): Culture: NO GROWTH

## 2011-03-01 LAB — CULTURE, BLOOD (ROUTINE X 2)
Culture  Setup Time: 201203282342
Culture: NO GROWTH

## 2011-03-14 ENCOUNTER — Ambulatory Visit: Payer: Medicare Other | Admitting: Gastroenterology

## 2011-04-05 ENCOUNTER — Ambulatory Visit: Payer: Medicare Other | Admitting: Gastroenterology

## 2011-04-15 NOTE — Consult Note (Signed)
[name], [name]              ACCOUNT NO.:  [id number]   MEDICAL RECORD NO.:  [id number]          PATIENT TYPE:  INP   LOCATION:  1825                         FACILITY:  [facility]   PHYSICIAN:  Gerrit Friends. Dietrich Pates, MD, FACCDATE OF BIRTH:  1935/04/22   DATE OF CONSULTATION:  07/14/2006  DATE OF DISCHARGE:                                   CONSULTATION   REFERRING:  Family Practice Teaching Service.   PRIMARY CARE:  [facility].   PRIMARY CARDIOLOGIST:  Rollene Rotunda, MD.   PRIMARY GASTROENTEROLOGIST:  Rachael Fee, MD   HISTORY OF PRESENT ILLNESS:  A 76year-old gentleman recently admitted to  [facility] with a non-Q myocardial infarction and acute lower GI  bleeding.  He did well with medical therapy from a cardiac standpoint.  He  underwent upper endoscopy with only duodenitis identified.  This was  apparently not thought to be the cause of his 6-unit lower GI bleed.  Plans  were made for colonoscopy and outpatient stress testing, but these  procedures have not yet been performed.  He passed approximately 12 ounces  of bright red blood per rectum prompting returned to the emergency  department, where hemoglobin and hematocrit were stable.  He has chronic  renal insufficiency that is now worse with a creatinine of 3.3.  His CPK has  returned to normal.  Troponin is still slightly elevated at 0.21.  The  patient has had no dyspnea nor chest discomfort over the past week.  He has  noted no lightheadedness and certainly no syncope.   [name] has multiple cardiovascular risk factors including diabetes,  hypertension and chronic renal insufficiency.  He has a longstanding history  of intermittent gout.  He also has osteoarthritis, allergic rhinitis, fatty  infiltration of the liver, carpal tunnel syndrome.   Medications at the time of discharge are as noted on his recent discharge  summary.  Of note, he had not been treated with an ACE inhibitor, he  is  taking a number of oral hypoglycemics, a statin, moderate doses of  furosemide, a beta-blocker, Lantus insulin and Aranesp.   SOCIAL HISTORY:  Updated; no change.   FAMILY HISTORY:  Positive for diabetes, hypertension and end-stage renal  disease as previously noted.   REVIEW OF SYSTEMS:  Updated.  He has had some abdominal bloating.  Otherwise  unchanged from recent assessments.   PHYSICAL EXAMINATION:  GENERAL:  On exam, a pleasant gentleman in no acute  distress.  VITAL SIGNS:  The temperature is 96.4, heart rate 82 and regular,  respirations 18, blood pressure 145/75.  NECK:  No jugular venous distension; no carotid bruits.  LUNGS:  Clear.  CARDIAC:  Normal first and second heart sounds; prominent fourth heart  sound; normal PMI.  ABDOMEN:  Distended; soft and nontender; normal bowel sounds.  EXTREMITIES:  Distal pulses intact; no edema.  NEUROMUSCULAR:  Symmetric strength and tone; normal cranial nerves.   EKG not available for review; described as ST-segment depression and T-wave  inversion in the inferior lateral leads.  This sounds similar to tracings  obtained during  his recent hospitalization.   IMPRESSION:  [name] presents difficult management problems with a  recent acute coronary syndrome and continued renal and gastrointestinal  problems.  He is to be admitted.  At this point, there appears to be no  significant blood loss.  A decision can be made regarding colonoscopy.  With  two recent bleeding episodes, I believe this would be warranted despite some  increased risk related to his coronary disease.  Beta blockers and aspirin  will be continued.  We will not add ACE inhibitor due to a recent increase  in creatinine.  A renal consultation would be useful if not previously  obtained.  He is not a candidate for anticoagulants, including clopidogrel,  due to recent episodes of bleeding.   We appreciate the request for consultation and would be happy to  follow this  gentleman with you.      Gerrit Friends. Dietrich Pates, MD, [facility]  Electronically Signed     RMR/MEDQ  D:  07/14/2006  T:  07/14/2006  Job:  [id number]

## 2011-04-15 NOTE — Assessment & Plan Note (Signed)
[facility]                           GASTROENTEROLOGY OFFICE NOTE   NAME:[name], [name]                       MRN:          [id number]  DATE:08/15/2006                            DOB:          1935/08/21    PRIMARY CARE PHYSICIAN:  Dr. Rudi Heap   GI PROBLEM LIST:  Lower GI bleed August 2007.  Possible diverticular  hemorrhage.  Patient underwent upper and lower endoscopy.  Upper endoscopy  July 10, 2006 by Dr. Melvia Heaps found mild duodenitis.  Colonoscopy  July 15, 2006 by Dr.  Lina Sar found internal hemorrhoids that were  possibly the source of his bleeding, diverticulosis and a 2.5 cm  pedunculated polyp in the sigmoid.  The polyp was removed and its pathology  has returned showing tubular adenoma without any high grade dysplasia.   INTERVAL HISTORY:  [name] was last seen by gastroenterology while he  was hospitalized in August.  He was discharged the end of August.  Since  then he has had no recurrent rectal bleeding, no melena, no bright red blood  per rectum.  He is here today to check in to discuss the pathology of his  polyp that was removed.   CURRENT MEDICATIONS:  Hydralazine, isosorbide, Flomax, glipizide,  colchicine, Lopressor, glimepiride.   PHYSICAL EXAMINATION:  Height 5 foot 7 inches, 219 pounds.  Blood pressure  100/52, pulse 84.  CONSTITUTIONAL:  Generally well-appearing.  NEUROLOGICAL:  Alert and oriented x3.  LUNGS:  Clear to auscultation bilaterally.  HEART:  Regular rate and rhythm.  ABDOMEN:  Soft, nontender, nondistended. Normal bowel sounds.   ASSESSMENT/PLAN:  A 76 year old man with recent lower gastrointestinal  bleeding, diverticular versus internal hemorrhoids.  He has had no signs of  recurrent bleeding.  I think he needs no further workup at this point.  He  did have a 2.5 cm tubular adenoma removed from his sigmoid and therefore he  requires surveillance colonoscopy in three years.  We  will arrange for that  to be done.                                   Rachael Fee, MD   DPJ/MedQ  DD:  08/15/2006  DT:  08/16/2006  Job #:  [id number]   cc:   Ernestina Penna, M.D.

## 2011-04-15 NOTE — Discharge Summary (Signed)
[name], BULLER              ACCOUNT NO.:  [id number]   MEDICAL RECORD NO.:  [id number]          PATIENT TYPE:  INP   LOCATION:  1401                         FACILITY:  [facility]   PHYSICIAN:  Bertram Millard. Dahlstedt, M.D.DATE OF BIRTH:  May 25, 1935   DATE OF ADMISSION:  11/08/2006  DATE OF DISCHARGE:  11/10/2006                               DISCHARGE SUMMARY   PRIMARY DIAGNOSIS:  1. Bladder outlet obstruction.  2. Prostate cancer.  3. Bilateral hydronephrosis.  4. History of renal insufficiency.  5. Hypertension.  6. Coronary artery disease, status post myocardial infarction.  7. Diabetes mellitus type 2.  8. Chronic liver disease.  9. Hypercholesterolemia.   PRINCIPAL PROCEDURE:  November 08, 2006, transurethral resection of the  prostate.   BRIEF HISTORY:  Mr. Gandolfi is a 76 year old gentleman, who I first saw  late this year with bladder outlet obstruction, hydronephrosis, and  renal insufficiency.  He was in the hospital for an MI.  He was treated  initially with Foley catheterization.  He had this taken out once he was  an outpatient, out of the intensive care unit.  The patient was found to  have a PSA in the 50 range.  There is no evidence of skeletal metastatic  disease.  He presents now for TURP, to relieve bladder outlet  obstruction, and help him void better.   ADMISSION DATA:  Hematocrit 30.1%, platelet count 254,000, white count  6700.  BMET was normal except for a BUN, creatinine of 45 and 2.8,  respectively.  Albumin was 3.4.  PSA was 36.  Urinalysis was clear.   HOSPITAL COURSE:  The patient was admitted to the hospital for a TURP.  He underwent the procedure without difficulty.  His postoperative course  was uncomplicated.  His catheter was removed on postoperative day #2.  At that time, he was voiding well and his urine was fairly clear.   He was discharged on his usual home medications, except for Flomax,  which he was told to stop.  He was also  discharged on Cipro 250 mg  daily.  He was also given Sanctura samples from the office, to take q.  12 h for urinary frequency.  He will follow up in my office in 1 week.  He was discharged on a regular diet, and activity restrictions were  discussed.      Bertram Millard. Dahlstedt, M.D.  Electronically Signed     SMD/MEDQ  D:  12/13/2006  T:  12/14/2006  Job:  [id number]

## 2011-04-15 NOTE — Op Note (Signed)
NAMEDIJON, COSENS              ACCOUNT NO.:  [id number]   MEDICAL RECORD NO.:  [id number]          PATIENT TYPE:  INP   LOCATION:  0005                         FACILITY:  [facility]   PHYSICIAN:  Bertram Millard. Dahlstedt, M.D.DATE OF BIRTH:  02/20/1935   DATE OF PROCEDURE:  11/08/2006  DATE OF DISCHARGE:                               OPERATIVE REPORT   PREOPERATIVE DIAGNOSIS:  Bladder outlet obstruction with nodular  prostate an elevated PSA.   POSTOPERATIVE DIAGNOSIS:  Bladder outlet obstruction with nodular  prostate an elevated PSA.   PRINCIPAL PROCEDURE:  TURP.   SURGEON:  Bertram Millard. Dahlstedt, M.D.   ANESTHESIA:  General with LMA.   COMPLICATIONS:  None.   BRIEF HISTORY:  I initially saw this gentleman several months ago.  At  that time, he had an elevated PSA of 55, a nodular prostate and bladder  outlet obstruction.  He had subsequent hydronephrosis and renal  insufficiency.  The patient has been treated with Flomax.  He was  initially treated with Foley catheter drainage.  His creatinine has  bumped up a little bit since the Foley has been removed.   I am strongly suspicious that the patient has prostate cancer.  Due to  his bladder outlet obstruction, his nodular prostate and his elevated  PSA, I feel at this point the best therapeutic and diagnostic maneuver  is to perform a TURP. The risks and complications of this procedure have  been discussed with Mr. Cake at length.  He understands these and  desires to proceed.   DESCRIPTION OF PROCEDURE:  The patient was administered preoperative IV  antibiotics.  He was taken to the operating room where a general  anesthetic was administered.  He was placed in the dorsal lithotomy  position.  Genitalia and perineum were prepped and draped.  Urethra was  calibrated to 30-French with Sissy Hoff sounds.  A cystoscope was then  passed through his urethra.  His prostate was fairly fixed and  moderately obstructive.  The bladder  was entered and inspected  circumferentially.  There was a large diverticulum in the right  posterior bladder, there was a bladder ear on the left.  I did not see  the ureteral orifices.  There were scattered trabeculations.  No tumors  or foreign bodies noted.  The prostatic urethra did have some small  nodular regrowth in the area of the verumontanum.   At this point, the scope was removed a resectoscope sheath was placed.  The anterior prostate was then resected from the bladder neck down to  the area of the veru. Circumferential resection was then performed.  There is not a large median lobe.  Resection was carried down such that  the prostatic fossa was wide open.  This did not entail significant  resection.  There was very minimal bleeding.  It was felt that the  tissue appeared a fair amount abnormal, and I am strongly suspicious  based on this appearance that there is cancer.  Small bleeders were  electrocoagulated.  The chips were evacuated from the bladder.  Once all  chips were evacuated, the  prostatic fossa was again inspected and found  to be hemostatic.  At this point, the scope was removed.  A 22-French  three-way Foley catheter with 30  mL balloon was then placed and hooked to dependent drainage.  It was  irrigated free.  The chips were then sent for pathologic review.   At this point, the procedure was terminated.  The patient was awakened  and taken to PACU in stable condition.      Bertram Millard. Dahlstedt, M.D.  Electronically Signed     SMD/MEDQ  D:  11/08/2006  T:  11/08/2006  Job:  [id number]   cc:   Olena [facility]

## 2011-04-15 NOTE — Discharge Summary (Signed)
[name], [name]              ACCOUNT NO.:  [id number]   MEDICAL RECORD NO.:  [id number]          PATIENT TYPE:  INP   LOCATION:  2014                         FACILITY:  [facility]   PHYSICIAN:  Rollene Rotunda, MD, FACCDATE OF BIRTH:  1935-06-19   DATE OF ADMISSION:  07/07/2006  DATE OF DISCHARGE:  07/13/2006                                 DISCHARGE SUMMARY   DATE OF BIRTH:  11/07/1935   PRIMARY CARDIOLOGIST:  Dr. Rollene Rotunda   GI DOCTOR:  Dr. Christella Hartigan.   DISCHARGING DIAGNOSES:  1. Coronary artery disease, status post non-ST elevated myocardial      infarction.  The patient not a candidate for cardiac catheterization at      this time.  2. Acute on chronic renal failure.  The patient evaluated by Dr.      Kathrene Bongo during this hospitalization.  3. Anemia.  Status post esophagogastroduodenoscopy with diagnosis of      duodenitis without hemorrhage.  The patient status post several blood      transfusions this admission.  4. Diabetes fully controlled.  5. Gout flare-up.  6. Degenerative joint disease.   PAST MEDICAL HISTORY:  Includes:  1. Fatty liver.  2. Carpal tunnel syndrome.  3. Gout.  4. Osteoarthritis.  5. Right renal insufficiency.  6. Diabetes.  7. Hypertension.  8. Allergic rhinitis.   PROCEDURES THIS ADMISSION:  Include EGD on July 10, 2006, by Dr. Arlyce Dice.   CONSULTATIONS THIS ADMISSION:  Include:  1. Dr. Kathrene Bongo on July 10, 2006.  2. Jennye Moccasin with Dr. Christella Hartigan on July 11, 2006.   HOSPITAL COURSE:  [name] is a 76 year old African American gentleman  with no known history of coronary artery disease.  He is followed by [facility].  I believe he sees Frazier Richards, [place] at [facility].  [name] was admitted on the day of admission for acute  shortness of breath.  The patient was found to be in pulmonary edema.  He  was also noted to have cardiac markers consistent with an MI.  On EKG the  patient had  inferior Q waves, timing of myocardial infarction was not  entirely clear.  However, the patient was also found to be anemic, possibly  related to a GI bleed and noted to have renal insufficiency.  Because of the  above reasons, the patient could not be cathed, but was managed medically.  [name] also has blood sugar issues/hyperglycemia during  hospitalization, which required aggressive treatment.  He also had a gout  flare-up during hospitalization.  The patient was managed medically.  GI and  nephrology were both asked to consult regarding the above diagnoses.  The  patient underwent an EGD during hospitalization after 6 units of packed  RBCs.  Dr. Kathrene Bongo felt patient's kidney function was more of a chronic  status.  However, she ordered a renal ultrasound which the patient had done  on July 11, 2006, that showed bilateral hydronephrosis and large postvoid  residual in urinary bladder, query bladder outlet obstruction, with plans  for followup as outpatient  for further workup.  The patient also underwent a  2D echocardiogram that showed an EF of 45% to 55% with inferior posterior  hypokinesis.  Cardiac rehab was asked to educate the patient and saw patient  in consultation.  The patient stabilized and was ready for discharge home on  July 13, 2006, after being seen by Dr. Antoine Poche.  At time of discharge,  blood work results are as follows:  WBC 6.9, H&H 11.1 and 33.1, with a  platelet count of 265,000.  Occult blood was negative on July 10, 2006.  Chemistry at time of discharge:  Sodium 137, potassium 4.6, glucose 161, BUN  56, creatinine 3.7.  Hemoglobin A1c during this admission was 8.2.  Total  cholesterol 128, triglyceride 68, HDL 49, LDL 65, TSH 2.446.  Iron 15, TIBC  367, percentage saturation 44.  UIBC 353, B12 488, folate serum 11.5,  ferritin 8, folate 11.5, parathyroid PTH intact 134.2, calcium for PTH 9.8.  Protein electrophoresis 24 hour total protein 3.1 by  urine.  At time of  discharge, the patient was afebrile, blood pressure 153/82, heart rate of  66.   MEDICATIONS AT TIME OF DISCHARGE:  The patient has been instructed to  discontinue his Avandia, lisinopril, hydrochlorothiazide, and Toprol-XL.  He  has been given a prescription for nitroglycerin for chest discomfort.  Medications at time of discharge include:  1. Isosorbide 60 mg daily.  2. Hydralazine 25 mg t.i.d.  3. Colchicine 0.6 mg t.i.d.  4. Glipizide ER 10 mg daily.  5. Aspirin 325 daily.  6. Lipitor 80 daily.  7. Furosemide 40 mg b.i.d.  8. Lopressor 50 mg b.i.d.  9. Protonix 40 mg daily.  10.The patient had Aranesp treatment initiated.  He is to receive 100 mcg      subcutaneously weekly on Thursdays through short stay.  11.Insulin Lantus 20 units at bedtime.  12.MiraLax 17 grams a day with water daily.   The patient is scheduled for his first Aranesp injection in short stay unit  at [facility] on July 20, 2006, at 10:45 a.m.  He is to follow up with  [facility] Friday, July 14, 2006, at 10:30.  Followup  with Dr. Antoine Poche on July 27, 2006, at 1:45, and Dr. Christella Hartigan with GI  August 15, 2006, at 1:30 p.m.  The patient is to continue a low-fat, low-  sodium, diabetic diet.  Follow up with renal as instructed by primary care  physician.   Duration of the discharge encounter 50 minutes.      Dorian Pod, ACNP    ______________________________  Rollene Rotunda, MD, [facility]    MB/MEDQ  D:  10/02/2006  T:  10/03/2006  Job:  [id number]

## 2011-04-15 NOTE — Consult Note (Signed)
NAMEJOHANN, [name]              ACCOUNT NO.:  [id number]   MEDICAL RECORD NO.:  [id number]          PATIENT TYPE:  INP   LOCATION:  4713                         FACILITY:  [facility]   PHYSICIAN:  [name], M.D.DATE OF BIRTH:  06/19/1935   DATE OF CONSULTATION:  07/18/2006  DATE OF DISCHARGE:                                   CONSULTATION   This is a hematology consultation requested to evaluate this man for  monoclonal protein in his urine.   [name] is a 76 year old obese African American man with longstanding  hypertension, type 2 diabetes, coronary artery disease status post recent  MI, gout, chronic renal insufficiency, and recent recurrent lower GI bleed.  His baseline creatinine is approximately 1.7, but has been running higher  recently and was 3.3 at time of current admission August 17.  It has  improved after placement of a Foley catheter and is now 2.1.  Although his  prostate gland was not enlarged on exam by a urologist during this  admission, a renal ultrasound done August 14 showed bilateral hydronephrosis  consistent with bladder outlet obstruction.   The patient has been poorly compliant with outpatient medical follow-up.  As  a part of his renal evaluation during recent August 10 through August 16  admission a serum protein electrophoresis was done which was normal showing  no monoclonal peak in the gamma region.  Serum immunofixation  electrophoresis was not done.  A 24-hour urine collection with a total  volume of 2100 mL showed a total protein of only 65 mg per 24 hours which is  incredibly good for a longstanding diabetic.  Immunofixation electrophoresis  on the urine did show the presence of free kappa light chains 56 mg per day  as well as some albumin.   The patient is anemic but anemia is hard to evaluate at this time in view of  chronic and acute renal failure and recent lower GI hemorrhage, presumably  from hemorrhoids.  At time of recent  July 07, 2006 admission with an acute  myocardial infarction hemoglobin was 6.9, MCV 78, serum iron 14 with  ferritin 8 and he was having active lower GI bleeding at that time.  It  certainly appears that this was chronic in nature and that he had a  significant ongoing GI blood loss to get his ferritin that low.  B12 and  folic acid levels were normal.  Creatinine was 3.  At time of current August  17 admission (just one day after he was discharged) hemoglobin 11.4, MCV 83,  ferritin 258.  Of note, he was started on iron and Aranesp during recent  admission.  He received blood transfusions.   Past history is as noted above.  In addition, he denied any asthma,  emphysema, tuberculosis, stomach ulcers, hepatitis, yellow jaundice, kidney  stones, thyroid trouble, seizures, blood clots, bleeding problems other than  the rectal bleeding.  His gout has flared up recently.   MEDICATIONS AT TIME OF AUGUST 16 DISCHARGE:  1. Isordil 60 mg daily.  2. Hydralazine 25 mg b.i.d.  3. Colchicine 0.6 mg  nightly.  4. Glipizide ER 10 mg daily.  5. Aspirin 325 mg daily.  6. Lipitor 80 mg daily.  7. Lasix 40 mg b.i.d.  8. Lopressor 50 mg b.i.d.  9. Protonix 40 mg daily.  10.Aranesp 100 mcg subcu weekly.  11.Lantus insulin 20 units at bedtime.  12.Miralax 17 g daily.   He is intolerant to ASPIRIN.  Allergic to [facility].   FAMILY HISTORY:  Mother had diabetes.  Father health history not known to  the patient.  He has three sisters, three brothers.  One brother is on  dialysis.   SOCIAL HISTORY:  He is a Naval architect.  Two healthy daughters.  Past heavy  alcohol use.  Remote smoking history, stopped over 20 years ago.   PERTINENT REVIEW OF SYSTEMS:  He denies any paresthesias.  No easy bruising.  No arrhythmias or palpitations.  He does admit to polyarthralgia secondary  to gout and degenerative arthritis.   PHYSICAL EXAMINATION:  GENERAL:  Pleasant, obese man.  VITAL SIGNS:  Estimated weight  recorded as 223 pounds, height 5 feet 7  inches, initial blood pressure 168/95, pulse 98, regular, respirations 14,  temperature 97.3.  SKIN:  Without any ecchymoses.  HEENT:  Pupils equal, reactive to light.  Pharynx:  No erythema or exudate.  Tongue is not thickened or enlarged.  There is no periorbital edema or  purpura.  NECK:  Supple.  No thyromegaly.  Carotids 2+.  LUNGS:  Clear.  Resonant to percussion.  CARDIAC:  Regular cardiac rhythm.  No murmur, gallop, or rub.  ABDOMEN:  Soft, obese.  No obvious mass or organomegaly.  EXTREMITIES:  No edema.  No calf tenderness.  NEUROLOGIC:  Grossly normal.  Vibration sense not tested.   Echocardiogram done August 11 showed a ventricular ejection fraction 45-50%  with inferior and posterior hypokinesis.  No evidence of diastolic  dysfunction was mentioned.   IMPRESSION:  Trace monoclonal light chain proteinuria.   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  My clinical impression is that it  may be a mild secondary amyloidosis due to his gouty arthritis with  associated chronic inflammation.  In general, light chain proteinuria is not  pathologic unless it is greater than or equal to 1 g.  There is a low  clinical suspicion for multiple myeloma and/or primary amyloidosis at this  time.  I do not feel that a bone marrow biopsy is indicated.   RECOMMENDATIONS:  I would repeat 24-hour urine total protein and  immunofixation electrophoresis on an every six to 45-month basis.   I will check baseline serum immunofixation electrophoresis, immunoglobulins,  and serum free light chains.   Thank you for this consultation.      [name] Churn. Cyndie Chime, M.D.  Electronically Signed     JMG/MEDQ  D:  07/18/2006  T:  07/19/2006  Job:  [id number]   cc:   C. Ulyess Mort, M.D.  Rollene Rotunda, MD, [facility] Rachael Fee, MD  Cecille Aver, M.D.

## 2011-04-15 NOTE — H&P (Signed)
NAMEADIEL, ERNEY              ACCOUNT NO.:  [id number]   MEDICAL RECORD NO.:  [id number]          PATIENT TYPE:  INP   LOCATION:  0005                         FACILITY:  [facility]   PHYSICIAN:  Bertram Millard. Dahlstedt, M.D.DATE OF BIRTH:  December 28, 1934   DATE OF ADMISSION:  11/08/2006  DATE OF DISCHARGE:                              HISTORY & PHYSICAL   REASON FOR ADMISSION:  Prostatic obstruction, history of renal  insufficiency/hydronephrosis, nodular prostate.   BRIEF HISTORY:  This 76 year old male who presents at this time for a  TURP.  I first saw him in the hospital quite a few weeks ago. At that  time he had hydronephrosis secondary to bladder outlet obstruction.  He  had secondary renal insufficiency.  He was treated with catheterization.  Initial evaluation revealed the patient had a PSA over 50, as well as a  significantly nodular prostate.   The patient has done fairly well since removal of this catheter.  He has  been on an alpha blocker.  He still has a nodular prostate and follow-up  PSA was in the 30s.  I am strongly suspicious that this patient has  prostate cancer.  He presents at this time for definitive therapeutic  and diagnostic procedure - we will be taking tissue from the prostate  with a TURP, as well as relieving the patient's outlet obstruction.  He  is aware of the risks and complications of a TURP.  These include but  are not limited to infection, bleeding, incontinence, increasing  erectile dysfunction, among others.  He accepts these and desires to  proceed.   PAST MEDICAL HISTORY:  Significant for heart disease, heart failure,  hypercholesterolemia, hypertension, diabetes, renal insufficiency,  arthritis, gout.   PAST SURGICAL HISTORY:  Significant for hand surgery in '74.   MEDICATIONS:  1. Lipitor 20 mg daily.  2. Flomax 0.4 mg daily.  3. Metoprolol 50 mg b.i.d.  4. Hydralazine 25 mg t.i.d.  5. Glipizide ER 5 mg p.o. q.a.m. and glipizide 10 mg  p.o. q.a.m.  6. Isosorbide 60 mg p.o. q.a.m.  7. Colchicine 0.6 mg 1 p.o. q.8 h.   ALLERGIES:  FELDENE.   SOCIAL HISTORY:  He is not married.  He has 2 daughters.  He is retired.  He does not drink alcohol.  He has been a smoker.   FAMILY HISTORY:  Significant for diabetes.   REVIEW OF SYSTEMS:  Significant for straining with urination,  constipation, headaches, shortness of breath, excessive thirst at times,  itching and blurred vision at times.  He has urinary frequency, urgency,  feeling of incomplete emptying, urgency incontinence.   PHYSICAL EXAMINATION:  GENERAL:  Pleasant elderly male.  VITAL SIGNS:  Blood pressure 158/84, pulse 77, respiratory rate 20,  temperature 98.4.  NECK:  Supple without mass or megaly.  CHEST:  Clear.  HEART:  Normal rate and rhythm.  ABDOMEN:  Protuberant, soft, nondistended, nontender.  No mass, no  megaly.  Bladder nonpalpable. No CVA tenderness, no flank mass.  PELVIC:  Phallus uncircumcised.  No lesions, plaques, fibrotic areas.  Glans normal, meatus normal location AND size.  No blood or discharge.  Scrotal skin normal.  Testicles slightly atrophic, otherwise normal.  Normal anal sphincter tone.  Gland 3+, nodular, nontender.  Seminal  vesicle tissue nonpalpable.   LABORATORY:  On admission, hematocrit 30.7%, white count 6700, platelet  count 250,000.  CMET normal except for a BUN and creatinine of 45 and  2.8 respectively, albumin 3.4.  PSA was 36.  Chest x-ray from August  revealed some airway thickening suggestive of reactive airway disease.  EKG normal sinus rhythm, ST and T-wave abnormality.   IMPRESSION:  1. Benign prostatic hypertrophy with nodularity/suspect prostate      cancer.  2. Urinary retention, secondary to bladder outlet obstruction.  3. Elevated PSA.  4. Diabetes mellitus.  5. History of coronary artery disease.   PLAN:  Admit for TURP.      Bertram Millard. Dahlstedt, M.D.  Electronically Signed     SMD/MEDQ  D:   11/08/2006  T:  11/08/2006  Job:  [id number]   cc:   [facility]   [facility]

## 2011-04-15 NOTE — Assessment & Plan Note (Signed)
[facility]                            CARDIOLOGY OFFICE NOTE   NAME:[name], [name]                       MRN:          [id number]  DATE:01/09/2007                            DOB:          1935/09/13    PRIMARY CARE PHYSICIAN:  Casimer Lanius., [facility].   REASON FOR PRESENTATION:  Evaluate patient with coronary disease.   HISTORY OF PRESENT ILLNESS:  The patient is referred back for follow-up  of his known coronary disease.  It has been a couple of years since I  last saw him.  He was in the hospital in August 2007.  He had a non-Q-  wave myocardial infarction. However, he had multiple problems with  chronic renal insufficiency and GI bleeding and was managed medically.  I saw him back in the office and performed a stress perfusion study,  which demonstrated that his EF was slightly reduced at 41%.  He had had  an inferior wall infarct but no evidence of other high-grade disease.  An echocardiogram had confirmed that his EF was about 45-50%.  He  follows with [facility] but it does not sound like it is incredibly  consistent.  They recently saw him and they have been addressing his  renal insufficiency, hypertension and diabetes.  He recently had  medications adjusted.   He denies any symptoms.  He is not particularly active.  However, with  his activities of daily living he denies any chest pressure, neck  discomfort, arm discomfort, activity-induced nausea or vomiting or  excessive diaphoresis.  He has had no palpitations, presyncope or  syncope.  He has had no PND or orthopnea.   PAST MEDICAL HISTORY:  1. Non-Q-wave myocardial infarction (never catheterized, managed      medically).  2. Diabetes mellitus.  3. Hypertension.  4. Renal insufficiency.  5. Mildly reduced ejection fraction.  6. Hypotension.   ALLERGIES:  FELDENE causes swelling.   MEDICATIONS:  1. Hydralazine 25 mg t.i.d.  2. Isosorbide 60 mg  daily.  3. Colchicine.  4. Lisinopril 10 mg daily.  5. Simvastatin 20 mg daily.  6. Metoprolol 50 mg b.i.d.  7. Januvia 50 mg daily.  8. Glipizide 15 mg daily.   REVIEW OF SYSTEMS:  As stated in the HPI and otherwise negative for  other systems.   PHYSICAL EXAMINATION:  The patient is in no distress.  The blood pressure 162/82, heart rate 75 and regular, weight 231 pounds.  HEENT:  Eyelids unremarkable.  Pupils equal, round and reactive to  light.  Fundi not visualized.  Oral mucosa unremarkable.  NECK:  No jugular venous distention at 45 degrees, carotid upstroke  brisk and symmetrical.  No bruits, no thyromegaly.  LYMPHATICS:  No cervical, axillary or inguinal adenopathy.  LUNGS:  Clear to auscultation bilaterally.  BACK:  No costovertebral angle tenderness.  CHEST:  Unremarkable.  HEART:  PMI not displaced or sustained, S1 and S2 within normal.  No S3,  no S4, no clicks, no rubs, no murmurs.  ABDOMEN:  Obese, positive bowel sounds, normal in frequency and pitch,  no bruits, no rebound, no guarding, no midline pulsatile mass, no  hepatomegaly, no splenomegaly.  SKIN:  No rash, no nodule.  EXTREMITIES:  2+ pulses throughout.  No edema, no cyanosis, no clubbing.  NEUROLOGIC:  Oriented to person, place and time.  Cranial nerves II-XII  grossly intact.  Motor grossly intact.   EKG:  Sinus rhythm, rate 75, left axis deviation, intervals within  normal limits, no acute ST-wave changes, T-wave inversions in the  lateral leads, unchanged from previous EKGs.   ASSESSMENT/PLAN:  1. Coronary disease.  The patient had coronary disease as described.      He still has renal insufficiency and had no high-grade ischemia in      other distributions.  He has had an out-of-hospital inferior      infarct.  Without any new symptoms, no further cardiovascular      testing is planned.  He does need aggressive risk reduction.  2. Cardiomyopathy.  The patient has a mildly reduced ejection  fraction      but class I symptoms.  At this point he is having his medications      appropriately titrated and I will defer to his primary caregivers.  3. Obesity.  We did discuss this.  I told him he is having his risk      factors modified but he needs to participate with diet and exercise      and taking care of self.  4. Diabetes.  Per Ms. Dixon.  5. Renal insufficiency.  The patient has baseline creatinine that      seems to be around 2.03.  He is going to follow with the      nephrologist.  6. Dyslipidemia.  The patient is on simvastatin.  I will defer to his      primary caregivers.  The goal would be an LDL less than 70 and an      HDL greater than 50 given his diabetes and coronary disease.  He is      not quite there with his LDL but he did have an excellent HDL (LDL      100, HDL 77).  7. Hypertension.  His blood pressure is still elevated.  However, he      just had lisinopril added.  I think this is an excellent choice.  A      target dose would be 40 mg if his creatinine can tolerate this.      This would be from a heart failure standpoint.  He may need less      for blood pressure control.  He is on a reasonable dose of      metoprolol, though in the future Toprol XL 150 mg is a target from      a heart failure standpoint.  8. Follow-up.  I would be happy to be involved in titrating his      medications if requested but      otherwise think he can do this and follow closely with his primary      caregivers.  I would like to see him at least once a year, however.     Rollene Rotunda, MD, [facility]  Electronically Signed    JH/MedQ  DD: 01/10/2008  DT: 01/12/2008  Job #: [id number]   cc:   [address] [place], [place]

## 2011-04-15 NOTE — Consult Note (Signed)
NAMEKESTER, [name] NO.:  [id number]   MEDICAL RECORD NO.:  [id number]          PATIENT TYPE:  INP   LOCATION:  2920                         FACILITY:  [facility]   PHYSICIAN:  Bertram Millard. Dahlstedt, M.D.DATE OF BIRTH:  07/28/1935   DATE OF CONSULTATION:  07/14/2006  DATE OF DISCHARGE:                                   CONSULTATION   REASON FOR CONSULTATION:  Difficult catheterization, urinary retention,  bilateral hydronephrosis.   BRIEF HISTORY:  This 76 year old African American male has a longstanding  history of multiple medical issues.  He was recently discharged from  hospital for an MI.  He has had recurrent lower GI bleeding.  He also has a  history of renal failure.  Sometime in the distant past.  His creatinine was  1.7, and more recently his creatinines have been in the 3-4 range.   He is diabetic.  He is hypertensive, has gout, osteoarthritis, and a history  of alcoholism.   Over the past few months, possibly longer, he has had increasing urinary  difficulties.  He has a slow stream, feeling of incomplete emptying, having  to strain, having urinary frequency, urgency, and intermittency.  He denies  prior urologic issues such as infections, gross hematuria, or any urologic  evaluation.  He does have a brother with prostate cancer.   Recent renal ultrasound for an increasing creatinine revealed a residual  urinary volume of about 1000 mL.  He has hydronephrosis bilaterally.   By report a nurse tried to place a catheter.  This was unsuccessful.  Urology was therefore, consulted.   PAST MEDICAL HISTORY:  Again significant for diabetes mellitus.  He has a  history of hypertension, gout, and has a history of renal insufficiency,  obesity, and degenerative joint disease.   CURRENT MEDICATIONS AS AN INPATIENT INCLUDE:  1. Isosorbide 60 mg p.o. daily.  2. Hydralazine 25 mg p.o. q.a.m.  3. Colchicine 0.6 mg p.o. t.i.d.  4. Lipitor 80 mg p.o. daily.  5.  Lopressor 50 mg p.o. b.i.d.  6. Lantus insulin 20 units q.h.s.  7. Sliding scale with NovoLog.  8. Protonix 40 mg p.o. b.i.d.   ALLERGIES:  He is allergic to [facility].   SOCIAL HISTORY:  The patient is divorced.  He is a retired [employer], for  the past 6-7 years.  He has 2 children.  He has a remote history of heavy  alcohol intake.   FAMILY HISTORY:  Significant for end-stage renal disease, prostate cancer.   REVIEW OF SYSTEMS:  The patient has an inability to obtain or maintain  erections.  He has somewhat low libido.  He has had recent blood per rectum.  He denies any gross hematuria or dysuria.  He denies any back pain.  He has  suprapubic/bladder fullness.  He has had no fevers.  He has no nausea or  vomiting.   PHYSICAL EXAMINATION:  GENERAL:  Exam revealed a pleasant obese male.  ABDOMEN:  Protuberant.  He had no masses.  There was suprapubic fullness  present.  He had no rebound or guarding.  No CVA tenderness  or flank mass.  GENITALIA:  Phallus uncircumcised.  Foreskin retracts easily.  No penile  plaques, lesions, or fibrotic areas.  Glans and meatus normal.  There was  some bloody discharge at his meatus.  The scrotal skin was normal.  Testicles atrophic bilaterally.  Cords and epididymal structures normal.  Normal anal sphincter tone.  Gland was somewhat small.  It was hard to  assess as the patient was lying in bed, but there was some nodularity  especially with the left side.  He had no significant tenderness.  There  were no rectal masses that I could palpate.  EXTREMITIES:  He had 1+ to 2+ pretibial edema.   I reviewed the patient's renal ultrasound findings performed on the August  14. The patient's BMET revealed a creatinine of 3.1 BUN 50, glucose of 158  an, otherwise BMET was normal.  Comprehensive metabolic profile was normal.   IMPRESSION:  1. Nodular prostate.  This would be on the back burner for now, but the      patient eventually needs a repeat  rectal exam while ambulatory plus a      PSA.  If suspicious, he will eventually need an ultrasound and biopsy      of the prostate, as there is a family history of prostate cancer.  It      is best to wait a few days after the catheter is placed to have a PSA      done; as urethral manipulation and urinary retention can elevate the      PSA.  2. Bladder outlet obstruction.  He obviously has some of this from      prostatic abnormality.  I am not suspicious that the patient has any      urethral stricture.  3. Renal insufficiency, acute/chronic.   PLAN:  1. I would leave catheter in for a long time.  We can follow him up for      voiding as an outpatient.  At the present time I would just leave him      on the catheter.  Prior to voiding trial, we can start him on Flomax.  2. Will stay on top of the prostate nodule; and will eventually order PSA.  3. Would follow serial BUN and creatinines.  The creatinine should revert      to normal somewhat with bladder drainage, as about 1200 mL came out      during catheterization.  Will continue to follow.      Bertram Millard. Dahlstedt, M.D.  Electronically Signed     SMD/MEDQ  D:  07/14/2006  T:  07/15/2006  Job:  [id number]

## 2011-04-15 NOTE — Assessment & Plan Note (Signed)
[name]                           GASTROENTEROLOGY OFFICE NOTE   NAME:[name], [name]                       MRN:          [id number]  DATE:09/01/2006                            DOB:          1935/04/07    PRIMARY CARE PHYSICIAN:  Dr. Rudi Heap   GI PROBLEM LIST:  1. Lower GI bleed, August 2007.  2. Possible diverticular hemorrhage.  Patient underwent upper and lower      endoscopy.  Upper endoscopy July 10, 2006 by Dr. Melvia Heaps found      mild duodenitis.  Colonoscopy July 15, 2006 by Dr.  Lina Sar found      internal hemorrhoids that were possibly the source of his bleeding,      diverticulosis and a 2.5 cm pedunculated polyp in the sigmoid.  The      polyp was removed and its pathology has returned showing tubular      adenoma without any high grade dysplasia.   INTERVAL HISTORY:  I last saw [name] 3 weeks ago.  He returns today  because he has some rectal bleeding over the past 2-3 days.  He describes  blood on the tissue paper and some small amount of blood dripping into the  toilet four to five times each day for the past 3 days.  His last bloody  bowel movement was more than 12 hours ago.  He otherwise feels fine.  No  chest pain, shortness of breath, lightheadedness when he stands.  He says  the volume of the blood is much less than during the dramatic bleeding  episode he had back in August 2007.   CURRENT MEDICATIONS:  Hydralazine, isosorbide, Flomax, glipizide,  colchicine, Lopressor, glimepiride.   PHYSICAL EXAMINATION:  VITAL SIGNS:  Weight 227 pounds, blood pressure  140/78, pulse 64.  CONSTITUTIONAL:  Generally well-appearing.  LUNGS:  Clear to auscultation bilaterally.  HEART:  Regular rate and rhythm.  ABDOMEN:  Soft, nontender, nondistended, normal bowel sounds.  RECTAL:  No obvious external hemorrhoids.  Internal exam:  No masses felt.  Stool was brown.  I did not heme test it.   ASSESSMENT AND PLAN:   A 76 year old man with what sounds like minor lower  gastrointestinal bleeding.  I favor hemorrhoids over diverticular source.   He will get a CBC today.  He certainly does not look hemodynamically  unstable but he knows to get in touch if he has more higher volume bleeding  or has any other concerns.  I suspect this is hemorrhoidal and I have given  him a prescription for AnaMantle which he will begin applying  twice daily.  He will get a CBC today as well.  He knows to get in touch if  he has any further problems.            ______________________________  Rachael Fee, MD      DPJ/MedQ  DD:  09/01/2006  DT:  09/04/2006  Job #:  [id number]   cc:   Ernestina Penna, M.D.

## 2011-04-15 NOTE — H&P (Signed)
NAME:  PAU, BANH NO.:  [id number]   MEDICAL RECORD NO.:  [id number]          PATIENT TYPE:  EMS   LOCATION:  MAJO                         FACILITY:  [facility]   PHYSICIAN:  Ok Anis, NPDATE OF BIRTH:  October 09, 1935   DATE OF ADMISSION:  07/07/2006  DATE OF DISCHARGE:                                HISTORY & PHYSICAL   PRIMARY CARE PHYSICIAN:  [facility].   PRIMARY CARDIOLOGIST:  The patient is new and being seen by Dr. Antoine Poche.   PATIENT PROFILE:  A 76 year old African male with no prior history of CAD  who presents with dyspnea and abdominal pain and non-ST-elevation MI.   PROBLEM LIST:  1. Non-ST-elevation MI.  2. Type 2 diabetes mellitus diagnosed approximately 20 years ago.  3. Hypertension diagnosed approximately 20 years ago.  4. Chronic renal insufficiency.  5. Gout since the 70s.  6. Osteoarthritis.  7. Allergic rhinitis.  8. Fatty liver.  9. Carpal tunnel syndrome.  10.Obesity.   HISTORY OF PRESENT ILLNESS:  70-year left American male with no prior  history of CAD.  Does have a history of hypertension, diabetes and renal  insufficiency.  He was usual state of health until approximately 7:30 p.m.  on August 9 when after dinner he developed sudden onset of dyspnea on  exertion and abdominal bloating and discomfort followed by nausea, vomiting,  diaphoresis and orthopnea.  He had a very restless night and this a.m. used  an old inhaler with some relief of symptoms.  He then went to his primary  care for Rayshad Riviello's office and after relaying his story, was sent on into  the ED via EMS.  Following arrival to the ED he was noted to have  significant ST depression in leads II, III, aVF with 2 mm downsloping ST  depression in V4-V6 and his first set of cardiac markers showed a CK-MB of  greater than 80 with a troponin I of 26.1.  He is currently pain free and  without any dyspnea using oxygen and saturating at 98% on 2  liters.   The patient is also anemic on presentation with a hemoglobin of 6.9,  hematocrit 21.1.  On additional questioning he reports a prior history of  melena as well as bright red blood per rectum occurring approximately 90  days ago.  At that point it lasted for about a week.  He has noted to notice  this periodically in the past couple of years, but has not noticed it in the  past several weeks.   ALLERGIES:  ASPIRIN causes GI upset, FELDENE causes swelling.   HOME MEDICATIONS:  Allopurinol 1 mg q.d., Avandia 4 mg q.d., glipizide ER 10  mg q.d., HCTZ 25 mg q.d., lisinopril 20 mg b.i.d., Motrin 800 mg q.p.m.  Toprol XL 50 mg q.d.   FAMILY HISTORY:  Mother died of diabetes at age 76.  Father died of lung  cancer in 102s.  He has three brothers and three sisters.  There is a history  of diabetes, hypertension and end-stage renal disease in his siblings.   SOCIAL HISTORY:  Lives in [place] by himself.  He is retired Ecologist.  He is two grown daughters.  He smoked a pack of cigarettes every  two weeks or so for about 20 years but quit that 30 years ago.  He used to  drink heavily but quit that 5 years ago.  He has previously used marijuana  in his lifetime but has not done any in the past 10 years.  He does not  routinely exercise.   REVIEW OF SYSTEMS:  Positive for diaphoresis, shortness of breath, dyspnea  on exertion, orthopnea, urinary straining and retention, arthralgias, joint  swelling and pain, nausea and vomiting, bright red blood per rectum, melena  approximately 90 days ago, abdominal pain during the night last night,  chronic constipation, diabetes.  All other systems reviewed are negative.   PHYSICAL EXAM:  VITAL SIGNS:  Temperature 98.0, heart rate 116, respirations  24, blood pressure 181/85, pulse ox 98% 2 liters.  GENERAL:  Pleasant African American male in no acute distress.  Awake,  alert, oriented x3.  HEENT: His sclerae are icteric.  NECK: No  bruits or JVD.  LUNGS:  Respirations regular unlabored with diminished breath sounds at the  right base otherwise clear to auscultation.  CARDIAC: Irregular S1, S2, no S3, S4 or murmurs.  ABDOMEN: Obese, soft, nontender, nondistended.  Bowel sounds present x4.  EXTREMITIES:  Warm, dry, no clubbing, cyanosis or edema.  Dorsalis pedis,  posterior tibial pulses 1+ bilaterally.  Femoral pulse on pulses 2+ without  bruits.  He is heme negative.   CHEST X-RAY:  Shows vascular congestion with right effusion.  EKG shows  sinus tach with PACs, rate 110.  He has normal axis, poor R-wave  progression, 1 mm upsloping ST depression in II, III, aVF with 2 mm  downsloping ST depression in V4-V6.  Hemoglobin 6.9, hematocrit 21.1, WBC  8.7, platelets 335.  Sodium 139, potassium 5.0, chloride 110, CO2 18.7, BUN  41, creatinine 3.0, glucose 350, CK-MB greater than 80 and then 77.6,  troponin I 26.1 and then greater than 30, D-dimer 1.34.   ASSESSMENT AND PLAN:  1. Non-ST-elevation MI.  The patient presents with MB greater than 80 and      troponin I now greater than 30.  His anginal equivalent appears to have      been dyspnea, abdominal pain, nausea and vomiting.  Plan to admit,      cycle cardiac markers and add enteric-coated aspirin, statin and      titrate beta blocker.  Will hold off an ACE inhibitor secondary to      renal insufficiency and other potential for pending cath.  Will add IV      nitroglycerin for blood pressure management.  Otherwise __________      going to have to hold off on anticoagulation in light of his      significant anemia with history of melena and bright red blood per      rectum, although notably he is Hemoccult negative currently.  Once we      can square away his anemia issues as well as renal failure issues, we      could reconsider cardiac catheterization.  At that point he would     require hydration.  We will check a 2-D echocardiogram to evaluate LV      function  as well as wall motion.  2. Hypertension.  Will plan to titrate beta blocker and initiate IV  nitroglycerin to get his pressure down.  Hold off on ACE inhibitor as      above.  3. Chronic renal insufficiency.  We have some outside records indicating      that at one point without a date, his creatinine was 1.7 and then and      on July 13 he was 2.7 and he is 3.0.  Will hold off an ACE inhibitor as      well as allopurinol and NSAIDs.  Will have to watch his creatinine      closely as we will be diuresing him some.  He will need hydration if      catheterization is in his future.  4. Anemia.  He is currently heme negative.  Will continue to guaiac stools      and will check iron studies.  Once iron studies are drawn, will      transfuse with 2 units of packed red cells.  I will ask GI for their      assistance.  The patient reports no prior history of GI evaluation and      has never had a colonoscopy or EGD.  As per HPI, he has had melena and      bright red blood per rectum in the past.  5. Type 2 diabetes mellitus.  His fasting sugar was 350 this morning.  He      is on oral agents at home.  We will hold both of these and likely will      have no plans to resume Avandia in light of his MI and heart failure.      Will place on Lantus and very tight sliding scale coverage.  Will check      hemoglobin A1c.  6. Gout, stable.  Hold allopurinol and NSAIDs at this time, in light of      renal failure.  If he develops daily symptoms in the setting of      diuresis that we may have to consider steroid therapy or colchicine.      Ok Anis, NP     CRB/MEDQ  D:  07/07/2006  T:  07/07/2006  Job:  [id number]

## 2011-04-15 NOTE — Consult Note (Signed)
[name], [name]              ACCOUNT NO.:  [id number]   MEDICAL RECORD NO.:  [id number]          PATIENT TYPE:  INP   LOCATION:  2912                         FACILITY:  [facility]   PHYSICIAN:  Cecille Aver, M.D.DATE OF BIRTH:  [birth year]   DATE OF CONSULTATION:  DATE OF DISCHARGE:                                   CONSULTATION   REQUESTING PHYSICIAN:  Dr. Daiva Nakayama.   REASON FOR REFERRAL:  Chronic kidney disease.   HISTORY OF PRESENT ILLNESS:  Mr. Petrow is a 76 year old black male with  past medical history significant for longstanding diabetes mellitus greater  than 20 years, possibly not always compliant, longstanding hypertension,  gout, obesity, and chronic kidney disease with a known creatinine of 2.7 in  July of '07, and a creatinine of 1.7 some time prior to that.  There is also  a notation on the renal ultrasound that was ordered in March of '06 that he  had an elevated creatinine at that time.  It does not sound as if patient  has regular medical followup.  He presented on August 10th with symptoms of  dyspnea on exertion, nausea, vomiting, orthopnea with EKG changes.  He since  has been admitted under cardiology service, has ruled in for an MI.  He was  found to have significant anemia with a hemoglobin of 6.9 and has been  transfused and had a negative EGD for bleeding source.  An echocardiogram  reveals an ejection fraction of 45 to 50% with inferior and posterior  hypokinesis.  Cardiac catheterization has been discussed but patient likely  at increased risk due to increased creatinine.  Here, creatinine has been  3.0, 3.1, and 3.4 today.  Urine output has been normal.  Blood pressure has  been controlled.  Patient denies ever being told he had difficulties with  his kidneys.   PAST MEDICAL HISTORY:  1. Diabetes mellitus for greater than 20 years.  2. Hypertension for 20 years.  3. Gout.  4. Obesity.  5. Degenerative joint disease.  6. Chronic  kidney disease as above.  7. History of carpal tunnel release.   CURRENT MEDICATION:  1. Aspirin 325 mg a day.  2. Lipitor 80 mg a day.  3. Hydralazine 25 q.8.  4. Lantus insulin 20 units a day.  5. Sliding scale NovoLog insulin.  6. Imdur 30 mg a day.  7. Lopressor 25 mg q.6 hours.  8. Protonix 40 mg a day.   ALLERGIES:  Seldane.   SOCIAL HISTORY:  Patient is a retired Naval architect.  He has a history of  tobacco use but has not used in quite some time.  He also has a history of  heavy alcohol use.  He lives by himself and was previously very functional  in driving.   FAMILY HISTORY:  Patient has a brother named Howard __________  who has end-  stage renal disease and is on dialysis at [facility].   REVIEW OF SYSTEMS:  Patient's abdominal pain is improved.  His shortness of  breath, dyspnea on exertion, and diaphoresis are all resolved.  Nausea,  vomiting is improved.  He reports a normal appetite, normal energy level.  Regarding urination, he has some trouble initiating his stream and nocturia  but otherwise denies any hematuria, dysuria, or excessive foaminess of  urine.  He has not had a kidney stone.  He has not had frequent urinary  tract infections and has not used nonsteroidal anti-inflammatory drugs.  He  denies lower extremity edema.  He denies chest pain, hematochezia.  Apparently, he had previous melena and bright red blood per rectum.  Otherwise, review of systems is negative.   PHYSICAL EXAM:  Patient is afebrile.  Blood pressure 120/65, heart rate is  80, and respirations are normal.  Oxygen saturation is 100% on room air.  In general, this is an obese black male.  He is simple in his answers.  He  is in no acute distress presently.  He is asking about going home.  HEENT:  Pupils are equal and round and reactive to light, extraocular  motions are intact, mucous membranes are moist without lesion.  NECK:  There is no jugular venous distention.   Lungs are clear to auscultation bilaterally with some mild wheezing.  CARDIOVASCULAR:  Regular rate and rhythm without murmur, gallop, or rub.  ABDOMEN:  Positive bowel sounds, soft, nontender, nondistended.  EXTREMITIES:  There is no clubbing, cyanosis, or edema.  NEURO:  The patient is alert and oriented and neuro exam is nonfocal.  Skin reveals no rashes.  LABS:  BUN and creatinine 46 and 3.5, potassium 4.4, calcium 9.0, hemoglobin  9.0 after multiple transfusions.  Hemoglobin A1c is 8.2.  Iron saturation is  4 and ferritin is 8.   ASSESSMENT:  76 year old black male with longstanding diabetes mellitus with  hypertension and history of chronic kidney disease at least dating back to  March of '06.  He is now hospitalized an acute MI.  1. Renal:  I suspect renal insufficiency is longstanding, chronic as      opposed to acute.  GFR has not changed significantly during this      hospitalization.  His current GFR is at best 25 mL per minute and I      have informed patient of this.  He does not appear to be having uremic      symptoms at this time.   I agree with cardiology that patient would be at increased risk for  worsening renal dysfunction and likely ESRD after cardiac catheterization.  I agree with their efforts to hold off on cath if possible.  However, if  absolutely requires, would take the usual precautions.   Patient is aware somewhat of dialysis, as his brother is on chronic dialysis  therapy.  Obviously, he wants to avoid that, but he does understand that it  is likely in his future.   I will check renal ultrasound and __________  to rule out these other  etiologies that might be reversible for renal insufficiency.   1. Anemia:  Patient is significantly iron deficient.  I agree with      transfusion, which will replete his iron stores as well.  He may      require some Epogen or Aranesp in the future.  1. I will screen him for secondary hyperparathyroidism.   Thank  you very much for this consultation.  We will continue to follow him  and likely will need to initiate followup as an outpatient as well.           ______________________________  Cecille Aver, M.D.     KAG/MEDQ  D:  07/10/2006  T:  07/11/2006  Job:  [id number]

## 2011-04-15 NOTE — Discharge Summary (Signed)
[name], [name]              ACCOUNT NO.:  [id number]   MEDICAL RECORD NO.:  [id number]          PATIENT TYPE:  INP   LOCATION:  4713                         FACILITY:  [facility]   PHYSICIAN:  Antony Contras, M.D.     DATE OF BIRTH:  May 18, 1935   DATE OF ADMISSION:  07/14/2006  DATE OF DISCHARGE:  07/22/2006                                 DISCHARGE SUMMARY   DISCHARGE DIAGNOSES:  1. Bladder outlet obstruction.  2. Lower gastrointestinal bleed.  3. Coronary artery disease.  Recent non ST elevation myocardial      infarction.  4. Hospital-acquired pneumonia.  5. Anemia.  6. Acute on chronic renal failure.  7. Diabetes.  8. Hypertension.   DISCHARGE MEDICATIONS:  1. Lipitor 80 mg p.o. daily.  2. Hydralazine 25 mg p.o. b.i.d.  3. Imdur 60 mg p.o. daily.  4. Lopressor 75 mg p.o. b.i.d.  5. Glipizide XL 15 mg p.o. daily.  6. Aranesp 100 mcg subcutaneous weekly.  7. Colchicine 0.6 mg p.o. b.i.d.  8. Flomax 0.4 mg p.o. at bedtime.  9. Avelox 400 mg p.o. daily x5 days.   DISPOSITION AND FOLLOWUP:  The patient will follow up with Dr. Retta Diones at  [facility] on Monday, Sept 17 at 2:45pm.  He will also follow up with  [facility] on Tuesday September 4 at 11am.  He is to  return to [facility] on July 27, 2006, at 1:45 p.m.  He is to  return to Dr. Christella Hartigan on August 15, 2006, at 1:30 p.m.   1. Issues to be followed up by his primary care physician:  These include      his anemia of what appears to be a combination of chronic disease as      well as possible slower GI loss from internal hemorrhoids and a history      of diverticular bleed.  Also to follow up will be diabetes management,      hypertension management, and anticoagulation with aspirin and possibly      Plavix.  GI recommended that the patient not have any anticoagulant      until July 29, 2006, at which point he should be okay to continue      with antiplatelet therapy.  2. At  [facility], the patient will be followed for bladder outlet      obstruction as well as possible intervention for his enlarged prostate,      at which point a PSA may be drawn as well as potential ultrasound and      biopsy of the prostate.  3. At his cardiology appointment, certainly his risk status should be      reviewed as the patient did have a non ST elevation MI at his previous      hospitalization.  He was not a candidate for intervention at that time      secondary to a GI bleed as well as renal insufficiency.  These issues      have been stabilized and any further workup is per cardiology      recommendations.  A final note per them recommended possible Myoview as      an outpatient.  4. At his gastroenterology appointment, certainly need attention to any      further GI bleeding.  On the days leading to discharge, the patient's      GI bleeding had resolved.  He was Hemoccult negative 2 days prior to      discharge and he had had no further maroon and/or blood-streaked      stools.  Again, patient is known to have diverticular disease, internal      hemorrhoids, and he had a polyp clipped.  Also, the pathology of polyp      should be followed up at that point.   PROCEDURES PERFORMED:  On July 15, 2006, the patient had a colonoscopy  performed.  This revealed mild sigmoid diverticulosis.  He had internal and  external hemorrhoids, grade I, and bleeding, with erosions and signs of  recent bleeding.  A polyp 25 mm, pedunculated was found and snared with  cautery, removed and retrieved and sent to pathology.  It was a large multi-  lobulated polyp removed in one piece.  Patient also had urology who inserted  a Foley catheter after the nurse on the floor was unable to do so.  No other  procedures performed during this hospitalization.   CONSULTATIONS:  1. Dr. Retta Diones, urology.  2. 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  3. Dr. Kathlene November,  [facility].  4. Dr. Arlyce Dice and Dr. Loreta Ave of [facility].   BRIEF ADMISSION HISTORY AND PHYSICAL:  [name] is a 76 year old  relatively obese African-American man with longstanding hypertension, type 2  diabetes, coronary artery disease with a recent NSTEMI on July 07, 2006,  gout, recurrent lower GI bleeding, who presented to [facility] with  complaints of bright red blood per rectum.  He first noticed this blood  approximately one year ago.  It occurs on an episodic nature and typically  resolves on its own.  Over the last 24 hours, he has had several bowel  movements with bright red blood noted in the toilet.  He is unable to  quantify the amount.  Of note, the patient had been recently admitted to  [facility] on the second week of August with a NSTEMI and at that time  cardiac cath was not an option secondary to his creatinine being greater  than 3 and a history of a possible GI bleed.  Review of imaging in the  online database revealed a renal ultrasound that showed bilateral  hydronephrosis with a high post-void residual.  The patient also endorses  dribbling, hesitancy, and urinary frequency.  He also has some lower  abdominal distention.  The patient denied chest pain, shortness of breath,  diaphoresis.  He had no nausea, vomiting or abdominal pain.   PHYSICAL EXAMINATION:  VITAL SIGNS:  Temperature of 96.9, blood pressure  148/76, pulse 82, respiratory rate 18, O2 sat 98% on room air.  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  No apparent distress but  uncomfortable-appearing.  HEENT:  Extraocular muscles were intact.  Melanosis around the eyes.  Posterior oropharynx - no erythema, no exudate.  Neck: Supple, nontender, no  thyromegaly.  CARDIOVASCULAR:  Regular rate and rhythm.  No murmurs, rubs or gallops.  LUNGS:  Clear to auscultation bilaterally. ABDOMEN:  Distended suprapubic region with minimal tenderness.  Normoactive  bowel sounds.  No  rebound, no guarding.  EXTREMITIES:  No cyanosis, clubbing or edema.  Full strength, 5/5 all  extremities.  GENITOURINARY EXAM:  Reveals normal external male genitalia.  Again, with  suprapubic tenderness and fullness.  No palpable lymph nodes.  NEUROLOGIC:  Alert and oriented x3.  No focal deficits.  PSYCHIATRIC:  His mood was irritated.  His affect was flat.   ADMISSION LABORATORIES:  Fecal occult blood positive.  Sodium 138, potassium  4.9, chloride 108, bicarb 22, BUN 54, creatinine 3.3, glucose 171.  White  blood cells 6.5, hemoglobin 11.4, platelets 287.  PT 13.8, INR 1.  Cardiac  enzymes were elevated,  0.21 for troponin,  2.8 for CK-MB on point of care.  Repeat values once on the floor, CK 126, CK-MB 3.3, troponin 0.4.  BNP was  339.   HOSPITAL COURSE:  Problem #1:  Bright red blood per rectum.  GI consultation  was obtained by Dr. Arlyce Dice who recommended the patient undergo colonoscopy  the following day.  Dr. Loreta Ave was on call for Saturday and performed the  colonoscopy on the second day of admission.  On colonoscopy, again internal  hemorrhoids as well as diverticular disease and polyp were noted.  There was  poor bowel prep and for that reason, imaging was not ideal.  On the fourth  day of admission, the patient reported that his lower GI bleeding had  recurred.  For this reason, all nursing staff was instructed to examine  patient's bowel movements prior to him flushing them.  A repeat rectal exam  was performed by a resident which revealed normal, loose, brown colored  stool which was Hemoccult negative.  Patient had several other bowel  movements observed by the nursing staff that had no blood, were non maroon,  and were normal in color and consistency.  During the last hospitalization,  the patient had required several units of transfused blood.  During this  hospitalization, his hemoglobin on admission was 11.4 and on the day of  discharge was slightly below 10.  The  patient refused repeatedly during this  admission Aranesp injection.  On the day of discharge, he was agreeable to  receiving one, however.   Problem #2:  Anemia.  Again, in past hospitalization, patient required  multiple units of transfusion.  This hospitalization, no transfusions.  His  hemoglobin was followed closely and his anemia was believed to be secondary  to the blood loss from GI tract as well as a component of chronic renal  disease with anemia of chronic disease.  Patient will be followed as an  outpatient at [facility] and will receive an Aranesp injection today prior  to leaving.  Ideally, his hemoglobin will be kept above 10 given his  significant coronary artery disease.   Problem #3:  Coronary artery disease.  Troponin was elevated on admission.  Cardiology consultation evaluated the patient and recommended further medical treatment given the patient's, at that time, GI blood loss as well  as renal insufficiency.  Troponins were thought to have remained elevated  from an MI several days prior.  EKG on review x2 revealed no significant  changes.  Patient had some possible ST depression in the lateral leads.  There were no inverted T's or ST elevations.  Cardiology recommended further  medical management with statin, beta blocker, long-acting nitroglycerin.  Also would consider adding aspirin and Plavix as soon as cleared by GI to do  so.  GI recommended 2 weeks of no antiplatelet therapy which would be ending  on July 29, 2006.   Problem #4:  Bladder outlet obstruction leading to acute renal failure.  Patient had begun to be worked up by renal on his prior admission.  Renal  ultrasound had been ordered.  Review of these records reveal significant  bilateral hydronephrosis with a very large post-void residual.  Urology  consult was obtained on the night of admission and Dr. Retta Diones was able to  successfully catheterize the patient.  This allowed a large volume  diuresis  over the first couple of days of admission.  His creatinine trended down  throughout this admission and eventually stabilized between 2.2 and 2.4.  Certainly it is still elevated, and the patient most likely has a component  of chronic renal disease from longstanding diabetes and hypertension.  Patient will be discharged with the Foley catheter in place and initiated on  a trial of Flomax 0.4 mg, and will follow up with urology prior to his  voiding trial.  Also, PSA will be obtained once the patient is an  outpatient, at which point it would probably would be more reflective of an  accurate value when not in the face of this acute setting.   Problem #5:  Hospital-acquired pneumonia.  Patient was admitted on review of  systems-endorsed cough.  White blood cell count, however, was normal.  Temperature was normal.  On the day of admission, a chest x-ray really was  interpreted as improved right-based air space disease.  There was no  pulmonary edema.  Repeat chest x-ray on August 19 revealed what was  interpreted as slight worsening of a right lower lobe bronchopneumonia  since the study 2 days prior.  In review of the chest x-ray, the patient's  cough, and given that he had been hospitalized, the patient was treated with  vancomycin and Zosyn.  Blood cultures never grew out any pathogens.  The  patient was transitioned to oral antibiotics which he tolerated well, and  the patient will finish the remainder course of his antibiotics as an  outpatient.   Problem #6:  Diabetes mellitus.  Patient is treated at home with oral  hypoglycemics.  However, he was unsure of his regimen.  He was started on  basal insulin here and A1c was obtained which was 7.2.  Patient continually  refused to receive insulin and/or fingersticks while an in-patient.  He was  really quite sensitive to any type of injection or blood draws.  He was discharged with an increase of his oral regimen to Glipizide XL  15 mg daily.  He will follow up with [facility] for management of  diabetes.   Problem #7:  Hypertension.  Patient was on beta blocker therapy while an in-  patient. Patient had previously been on an ACE inhibitor as well as  hydrochlorothiazide which were held after his last hospitalization.  Cardiology actually weighed in on this as well, and the patient was  discharged on beta blocker therapy as well as hydralazine.  Blood pressure  was moderately well controlled as an in-patient.  Again, will defer to [facility] for chronic management of hypertension.   DISCHARGE LABORATORIES AND VITALS:  On the day of discharge, white blood  cell 8.9, hemoglobin 9.1, platelets 365.  Sodium 139, potassium 4.1,  chloride 110, bicarb 25, BUN 12, creatinine 2.2, glucose 109.  T-max was  98.5.  Blood pressure 160/72,      Antony Contras, M.D.  Electronically Signed     GL/MEDQ  D:  07/22/2006  T:  07/22/2006  Job:  [id number]   cc:   Bertram Millard. Dahlstedt, M.D.  Rachael Fee, MD  Rollene Rotunda, MD, [facility]  Ernestina Penna, M.D.

## 2011-04-15 NOTE — Assessment & Plan Note (Signed)
[facility]                              CARDIOLOGY OFFICE NOTE   NAME:[name], [name]                       MRN:          [id number]  DATE:07/27/2006                            DOB:          June 23, 1935    PRIMARY CARE:  [facility].   REASON FOR PRESENTATION:  Patient with recent out-of-hospital myocardial  infarction.   HISTORY OF PRESENT ILLNESS:  The patient presents for followup.  He has had  two recent hospitalizations.  On the first, he was admitted on August 10  with acute shortness of breath.  He was in pulmonary edema.  He was noted to  have cardiac markers consistent with MI, the timing which was not entirely  clear.  He did have inferior Q-waves on his EKG.  However, because of anemia  possibly related to GI bleed and chronic renal insufficiency, he could not  be cathed but was managed medically.  He also had blood sugars that were out  of control and had a gout flare during that hospitalization.  He was managed  medically.  GI was consulted, and nephrologists were consulted as well.  Eventually, discharge became back with difficulty urinating, as well as  bright red blood.  He was subsequently found to have internal hemorrhoids.  He was also found to have bladder outlet obstruction with enlarged prostate  and had a Foley catheter placed.   Patient now returns for followup.  He is very confused about his  medications.  He came with a bag of medications that included medicines from  the first discharge and the second discharge, and meds he was taking prior  to admission.  He was not clear on any of them and why he was taking them.  It was not clear to me how he was taking them.   Despite this, he has been relatively well.  He has not been having any chest  pain or shortness of breath.  He denies any PND or orthopnea.  He has not  been having any palpitations, pre-syncope or syncope.   PAST MEDICAL HISTORY:  Diabetes,  hypertension, renal insufficiency, out of  hospital myocardial infarction as well, mildly reduced ejection fraction,  gout, hypotension.   ALLERGIES:  FELDENE CAUSED SWELLING.   MEDICATIONS:  (Per the discharge summary, Lipitor 80 mg a day, hydralazine  25 mg b.i.d., Imdur 60 mg q. day, Lopressor 75 mg b.i.d., Glipizide 15 mg q.  day, Aranesp 100 mcg q. week, Colchicine 0.6 mg b.i.d., Flomax 0.4 mg q.  day).   REVIEW OF SYSTEMS:  As stated in the HPI and otherwise negative for other  systems.   PHYSICAL EXAMINATION:  GENERAL:  The patient is in no distress.  VITAL SIGNS:  Blood pressure 126/56, heart rate 81 and regular, weight 217  pounds, body mass index 34.  HEENT:  Eyes unremarkable.  Pupils equal, round, reactive to light.  Fundi  not visualized.  NECK:  No jugular venous distention of 90 degrees, carotid upstroke brisk  and symmetric.  No bruits, thyromegaly.  LYMPHATICS:  No lymphadenopathy.  LUNGS:  Clear to auscultation bilaterally.  BACK:  No costovertebral angle tenderness.  CHEST:  Unremarkable.  HEART:  PMI not displaced or sustained.  S1 and S2 within normal limits, no  S3, no S4, no murmurs.  ABDOMEN:  Obese, positive bowel sounds, normal in frequency and in pitch.  No bruits, no rebound, no guarding, no midline pulsatile masses or  organomegaly.  SKIN:  No rashes, no nodules.  EXTREMITIES:  2+ pulses, no edema.   EKG:  Sinus rhythm, rate 81, inferolateral T-wave inversions.   ASSESSMENT/PLAN:  1. Coronary disease.  Patient has had an out-of-hospital myocardial      infarction approximately 3 weeks ago.  He has had no symptoms since      that time.  He may need some neurologic procedures.  At this point, I      am going to have him come back for an adenosine perfusion study,      further evaluate his coronary disease.  He will continue with medical      management.  2. Cardiomyopathy.  Patient had a mildly-reduced ejection fraction.  He      has been educated  by salt and fluid restriction.  He will continue with      medical management.  3. Renal insufficiency.  We are avoiding ACE inhibitors.  He is being      followed by nephrology.  4. Medications.  We had a long discussion and took quite a bit of time      sorting out all of his medicines.  We will remove the bottles that he      is not supposed to be taking.  We clarified the bottles that he was      supposed to be taking.  I am going to have him increase his hydralazine      to t.i.d., as that is what is written on his bottle.  He has been given      prescription for Lipitor and Flomax, because he did not have these.  We      are going to get a BMET today.  He needs a lot of education which is      what we did in the hospital.  5. Dyslipidemia.  He will be on the Lipitor.  We will talk with his      primary care givers at [facility], make sure he gets his diabetes,      his blood pressure and his lipids followed.  6. Followup.  I may see him back in about a month, sooner if needed.  Of      note, I am going to get the Cardiolite, and if he needs any high or      moderate-risk invasive procedures, he would need to have these results      available before he could be cleared from a cardiovascular standpoint.                               Rollene Rotunda, MD, [facility]    JH/MedQ  DD:  07/27/2006  DT:  07/28/2006  Job #:  [id number]   cc:   [facility]  Bertram Millard. Retta Diones, MD  Genene Churn. Cyndie Chime, MD  Barbette Hair. Arlyce Dice, MD, [facility]

## 2011-04-24 ENCOUNTER — Inpatient Hospital Stay (HOSPITAL_COMMUNITY): Payer: Medicare Other

## 2011-04-24 ENCOUNTER — Inpatient Hospital Stay (HOSPITAL_COMMUNITY)
Admission: EM | Admit: 2011-04-24 | Discharge: 2011-04-29 | DRG: 988 | Disposition: A | Discharge status: Home Health | Payer: Medicare Other | Attending: Internal Medicine | Admitting: Internal Medicine

## 2011-04-24 ENCOUNTER — Emergency Department (HOSPITAL_COMMUNITY): Payer: Medicare Other

## 2011-04-24 DIAGNOSIS — M109 Gout, unspecified: Secondary | ICD-10-CM | POA: Diagnosis present

## 2011-04-24 DIAGNOSIS — K644 Residual hemorrhoidal skin tags: Secondary | ICD-10-CM | POA: Diagnosis present

## 2011-04-24 DIAGNOSIS — I129 Hypertensive chronic kidney disease with stage 1 through stage 4 chronic kidney disease, or unspecified chronic kidney disease: Secondary | ICD-10-CM | POA: Diagnosis present

## 2011-04-24 DIAGNOSIS — R339 Retention of urine, unspecified: Secondary | ICD-10-CM | POA: Diagnosis not present

## 2011-04-24 DIAGNOSIS — I509 Heart failure, unspecified: Secondary | ICD-10-CM | POA: Diagnosis present

## 2011-04-24 DIAGNOSIS — C801 Malignant (primary) neoplasm, unspecified: Secondary | ICD-10-CM | POA: Diagnosis present

## 2011-04-24 DIAGNOSIS — M1990 Unspecified osteoarthritis, unspecified site: Secondary | ICD-10-CM | POA: Diagnosis present

## 2011-04-24 DIAGNOSIS — J189 Pneumonia, unspecified organism: Principal | ICD-10-CM | POA: Diagnosis present

## 2011-04-24 DIAGNOSIS — I2510 Atherosclerotic heart disease of native coronary artery without angina pectoris: Secondary | ICD-10-CM | POA: Diagnosis present

## 2011-04-24 DIAGNOSIS — D509 Iron deficiency anemia, unspecified: Secondary | ICD-10-CM | POA: Diagnosis present

## 2011-04-24 DIAGNOSIS — Z8546 Personal history of malignant neoplasm of prostate: Secondary | ICD-10-CM

## 2011-04-24 DIAGNOSIS — N184 Chronic kidney disease, stage 4 (severe): Secondary | ICD-10-CM | POA: Diagnosis present

## 2011-04-24 DIAGNOSIS — Z87891 Personal history of nicotine dependence: Secondary | ICD-10-CM

## 2011-04-24 DIAGNOSIS — K648 Other hemorrhoids: Secondary | ICD-10-CM | POA: Diagnosis present

## 2011-04-24 DIAGNOSIS — R5381 Other malaise: Secondary | ICD-10-CM | POA: Diagnosis present

## 2011-04-24 DIAGNOSIS — E785 Hyperlipidemia, unspecified: Secondary | ICD-10-CM | POA: Diagnosis present

## 2011-04-24 DIAGNOSIS — E119 Type 2 diabetes mellitus without complications: Secondary | ICD-10-CM | POA: Diagnosis present

## 2011-04-24 DIAGNOSIS — D126 Benign neoplasm of colon, unspecified: Secondary | ICD-10-CM | POA: Diagnosis present

## 2011-04-24 DIAGNOSIS — I252 Old myocardial infarction: Secondary | ICD-10-CM

## 2011-04-24 LAB — DIFFERENTIAL
Eosinophils Relative: 3 % (ref 0–5)
Lymphocytes Relative: 25 % (ref 12–46)
Lymphs Abs: 1.2 10*3/uL (ref 0.7–4.0)
Monocytes Absolute: 0.3 10*3/uL (ref 0.1–1.0)

## 2011-04-24 LAB — OCCULT BLOOD, POC DEVICE: Fecal Occult Bld: POSITIVE

## 2011-04-24 LAB — URINE MICROSCOPIC-ADD ON

## 2011-04-24 LAB — CBC
HCT: 27.8 % — ABNORMAL LOW (ref 39.0–52.0)
MCH: 25.5 pg — ABNORMAL LOW (ref 26.0–34.0)
MCHC: 32.7 g/dL (ref 30.0–36.0)
MCV: 77.9 fL — ABNORMAL LOW (ref 78.0–100.0)
RDW: 17.6 % — ABNORMAL HIGH (ref 11.5–15.5)

## 2011-04-24 LAB — CK: Total CK: 110 U/L (ref 7–232)

## 2011-04-24 LAB — COMPREHENSIVE METABOLIC PANEL
ALT: 25 U/L (ref 0–53)
CO2: 22 mEq/L (ref 19–32)
Calcium: 8.9 mg/dL (ref 8.4–10.5)
Creatinine, Ser: 2.66 mg/dL — ABNORMAL HIGH (ref 0.4–1.5)
GFR calc non Af Amer: 24 mL/min — ABNORMAL LOW (ref >60)
Glucose, Bld: 310 mg/dL — ABNORMAL HIGH (ref 70–99)

## 2011-04-24 LAB — URINALYSIS, ROUTINE W REFLEX MICROSCOPIC
Bilirubin Urine: NEGATIVE
Specific Gravity, Urine: 1.012 (ref 1.005–1.030)
pH: 5.5 (ref 5.0–8.0)

## 2011-04-24 LAB — TROPONIN I: Troponin I: <0.3 ng/mL (ref <0.30)

## 2011-04-24 LAB — CARDIAC PANEL(CRET KIN+CKTOT+MB+TROPI): CK, MB: 3 ng/mL (ref 0.3–4.0)

## 2011-04-25 ENCOUNTER — Inpatient Hospital Stay (HOSPITAL_COMMUNITY): Payer: Medicare Other

## 2011-04-25 DIAGNOSIS — K625 Hemorrhage of anus and rectum: Secondary | ICD-10-CM

## 2011-04-25 DIAGNOSIS — D649 Anemia, unspecified: Secondary | ICD-10-CM

## 2011-04-25 LAB — GLUCOSE, CAPILLARY
Glucose-Capillary: 348 mg/dL — ABNORMAL HIGH (ref 70–99)
Glucose-Capillary: 283 mg/dL — ABNORMAL HIGH (ref 70–99)
Glucose-Capillary: 275 mg/dL — ABNORMAL HIGH (ref 70–99)

## 2011-04-25 LAB — LIPID PANEL
Cholesterol: 125 mg/dL (ref 0–200)
LDL Cholesterol: 59 mg/dL (ref 0–99)

## 2011-04-25 LAB — CARDIAC PANEL(CRET KIN+CKTOT+MB+TROPI)
Relative Index: INVALID (ref 0.0–2.5)
Total CK: 94 U/L (ref 7–232)

## 2011-04-25 LAB — HEMOGLOBIN A1C: Mean Plasma Glucose: 200 mg/dL — ABNORMAL HIGH (ref <117)

## 2011-04-25 LAB — POTASSIUM: Potassium: 4.3 mEq/L (ref 3.5–5.1)

## 2011-04-26 DIAGNOSIS — D649 Anemia, unspecified: Secondary | ICD-10-CM

## 2011-04-26 DIAGNOSIS — K922 Gastrointestinal hemorrhage, unspecified: Secondary | ICD-10-CM

## 2011-04-26 LAB — BASIC METABOLIC PANEL
BUN: 36 mg/dL — ABNORMAL HIGH (ref 6–23)
CO2: 23 mEq/L (ref 19–32)
GFR calc non Af Amer: 19 mL/min — ABNORMAL LOW (ref >60)
Glucose, Bld: 188 mg/dL — ABNORMAL HIGH (ref 70–99)
Potassium: 3.9 mEq/L (ref 3.5–5.1)

## 2011-04-26 LAB — CBC
HCT: 26.8 % — ABNORMAL LOW (ref 39.0–52.0)
Hemoglobin: 8.7 g/dL — ABNORMAL LOW (ref 13.0–17.0)
MCHC: 32.5 g/dL (ref 30.0–36.0)
MCV: 77.9 fL — ABNORMAL LOW (ref 78.0–100.0)
RDW: 17.8 % — ABNORMAL HIGH (ref 11.5–15.5)
WBC: 5.1 10*3/uL (ref 4.0–10.5)

## 2011-04-26 LAB — GLUCOSE, CAPILLARY
Glucose-Capillary: 262 mg/dL — ABNORMAL HIGH (ref 70–99)
Glucose-Capillary: 141 mg/dL — ABNORMAL HIGH (ref 70–99)

## 2011-04-26 LAB — URINE CULTURE
Culture  Setup Time: 201205272345
Culture: NO GROWTH

## 2011-04-27 ENCOUNTER — Other Ambulatory Visit: Payer: Self-pay | Admitting: Internal Medicine

## 2011-04-27 DIAGNOSIS — K922 Gastrointestinal hemorrhage, unspecified: Secondary | ICD-10-CM

## 2011-04-27 DIAGNOSIS — K921 Melena: Secondary | ICD-10-CM

## 2011-04-27 DIAGNOSIS — D126 Benign neoplasm of colon, unspecified: Secondary | ICD-10-CM

## 2011-04-27 DIAGNOSIS — D649 Anemia, unspecified: Secondary | ICD-10-CM

## 2011-04-27 DIAGNOSIS — K5730 Diverticulosis of large intestine without perforation or abscess without bleeding: Secondary | ICD-10-CM

## 2011-04-27 LAB — CBC
Hemoglobin: 8.6 g/dL — ABNORMAL LOW (ref 13.0–17.0)
MCH: 25.1 pg — ABNORMAL LOW (ref 26.0–34.0)
MCHC: 32.2 g/dL (ref 30.0–36.0)

## 2011-04-27 LAB — BASIC METABOLIC PANEL
CO2: 23 mEq/L (ref 19–32)
Calcium: 8.4 mg/dL (ref 8.4–10.5)
Creatinine, Ser: 3.54 mg/dL — ABNORMAL HIGH (ref 0.4–1.5)
GFR calc Af Amer: 21 mL/min — ABNORMAL LOW (ref >60)
Glucose, Bld: 120 mg/dL — ABNORMAL HIGH (ref 70–99)

## 2011-04-27 LAB — GLUCOSE, CAPILLARY: Glucose-Capillary: 113 mg/dL — ABNORMAL HIGH (ref 70–99)

## 2011-04-28 DIAGNOSIS — K648 Other hemorrhoids: Secondary | ICD-10-CM

## 2011-04-28 DIAGNOSIS — K625 Hemorrhage of anus and rectum: Secondary | ICD-10-CM

## 2011-04-28 DIAGNOSIS — K922 Gastrointestinal hemorrhage, unspecified: Secondary | ICD-10-CM

## 2011-04-28 DIAGNOSIS — D126 Benign neoplasm of colon, unspecified: Secondary | ICD-10-CM

## 2011-04-28 LAB — PHOSPHORUS: Phosphorus: 4.5 mg/dL (ref 2.3–4.6)

## 2011-04-28 LAB — BASIC METABOLIC PANEL
Chloride: 107 mEq/L (ref 96–112)
GFR calc Af Amer: 21 mL/min — ABNORMAL LOW (ref >60)
Potassium: 4.5 mEq/L (ref 3.5–5.1)
Sodium: 137 mEq/L (ref 135–145)

## 2011-04-28 LAB — GLUCOSE, CAPILLARY: Glucose-Capillary: 114 mg/dL — ABNORMAL HIGH (ref 70–99)

## 2011-04-28 LAB — CBC
HCT: 26.2 % — ABNORMAL LOW (ref 39.0–52.0)
Hemoglobin: 8.7 g/dL — ABNORMAL LOW (ref 13.0–17.0)
WBC: 5.1 10*3/uL (ref 4.0–10.5)

## 2011-04-28 LAB — MAGNESIUM: Magnesium: 1.6 mg/dL (ref 1.5–2.5)

## 2011-04-29 LAB — CARDIAC PANEL(CRET KIN+CKTOT+MB+TROPI)
CK, MB: 1.9 ng/mL (ref 0.3–4.0)
Total CK: 60 U/L (ref 7–232)
Troponin I: <0.3 ng/mL (ref <0.30)

## 2011-04-29 LAB — BASIC METABOLIC PANEL
CO2: 21 mEq/L (ref 19–32)
Calcium: 8.2 mg/dL — ABNORMAL LOW (ref 8.4–10.5)
Chloride: 107 mEq/L (ref 96–112)
Creatinine, Ser: 3.85 mg/dL — ABNORMAL HIGH (ref 0.4–1.5)
Glucose, Bld: 227 mg/dL — ABNORMAL HIGH (ref 70–99)
Sodium: 136 mEq/L (ref 135–145)

## 2011-04-29 LAB — CBC
HCT: 23.9 % — ABNORMAL LOW (ref 39.0–52.0)
Hemoglobin: 7.9 g/dL — ABNORMAL LOW (ref 13.0–17.0)
MCH: 25.7 pg — ABNORMAL LOW (ref 26.0–34.0)
MCHC: 33.1 g/dL (ref 30.0–36.0)

## 2011-04-29 LAB — GLUCOSE, CAPILLARY: Glucose-Capillary: 191 mg/dL — ABNORMAL HIGH (ref 70–99)

## 2011-05-01 ENCOUNTER — Encounter: Payer: Self-pay | Admitting: Internal Medicine

## 2011-05-10 ENCOUNTER — Emergency Department (HOSPITAL_COMMUNITY)
Admission: EM | Admit: 2011-05-10 | Discharge: 2011-05-10 | Disposition: A | Discharge status: Home / Self Care | Payer: Medicare Other | Attending: Emergency Medicine | Admitting: Emergency Medicine

## 2011-05-10 ENCOUNTER — Emergency Department (HOSPITAL_COMMUNITY): Payer: Medicare Other

## 2011-05-10 DIAGNOSIS — K625 Hemorrhage of anus and rectum: Secondary | ICD-10-CM | POA: Insufficient documentation

## 2011-05-10 DIAGNOSIS — I252 Old myocardial infarction: Secondary | ICD-10-CM | POA: Insufficient documentation

## 2011-05-10 DIAGNOSIS — R059 Cough, unspecified: Secondary | ICD-10-CM | POA: Insufficient documentation

## 2011-05-10 DIAGNOSIS — D649 Anemia, unspecified: Secondary | ICD-10-CM | POA: Insufficient documentation

## 2011-05-10 DIAGNOSIS — R071 Chest pain on breathing: Secondary | ICD-10-CM | POA: Insufficient documentation

## 2011-05-10 DIAGNOSIS — I10 Essential (primary) hypertension: Secondary | ICD-10-CM | POA: Insufficient documentation

## 2011-05-10 DIAGNOSIS — R0602 Shortness of breath: Secondary | ICD-10-CM | POA: Insufficient documentation

## 2011-05-10 DIAGNOSIS — R109 Unspecified abdominal pain: Secondary | ICD-10-CM | POA: Insufficient documentation

## 2011-05-10 DIAGNOSIS — Z79899 Other long term (current) drug therapy: Secondary | ICD-10-CM | POA: Insufficient documentation

## 2011-05-10 DIAGNOSIS — N289 Disorder of kidney and ureter, unspecified: Secondary | ICD-10-CM | POA: Insufficient documentation

## 2011-05-10 DIAGNOSIS — R6883 Chills (without fever): Secondary | ICD-10-CM | POA: Insufficient documentation

## 2011-05-10 DIAGNOSIS — R5381 Other malaise: Secondary | ICD-10-CM | POA: Insufficient documentation

## 2011-05-10 DIAGNOSIS — R5383 Other fatigue: Secondary | ICD-10-CM | POA: Insufficient documentation

## 2011-05-10 DIAGNOSIS — R05 Cough: Secondary | ICD-10-CM | POA: Insufficient documentation

## 2011-05-10 LAB — URINALYSIS, ROUTINE W REFLEX MICROSCOPIC
Bilirubin Urine: NEGATIVE
Glucose, UA: NEGATIVE mg/dL
Ketones, ur: NEGATIVE mg/dL
Protein, ur: 30 mg/dL — AB
Urobilinogen, UA: 0.2 mg/dL (ref 0.0–1.0)

## 2011-05-10 LAB — COMPREHENSIVE METABOLIC PANEL
ALT: 11 U/L (ref 0–53)
AST: 13 U/L (ref 0–37)
Alkaline Phosphatase: 78 U/L (ref 39–117)
CO2: 22 mEq/L (ref 19–32)
Chloride: 110 mEq/L (ref 96–112)
GFR calc Af Amer: 25 mL/min — ABNORMAL LOW (ref >60)
GFR calc non Af Amer: 21 mL/min — ABNORMAL LOW (ref >60)
Glucose, Bld: 161 mg/dL — ABNORMAL HIGH (ref 70–99)
Potassium: 4.3 mEq/L (ref 3.5–5.1)
Sodium: 141 mEq/L (ref 135–145)
Total Bilirubin: 0.4 mg/dL (ref 0.3–1.2)

## 2011-05-10 LAB — OCCULT BLOOD, POC DEVICE: Fecal Occult Bld: POSITIVE

## 2011-05-10 LAB — CK TOTAL AND CKMB (NOT AT ARMC)
Relative Index: INVALID (ref 0.0–2.5)
Relative Index: INVALID (ref 0.0–2.5)
Total CK: 62 U/L (ref 7–232)
Total CK: 70 U/L (ref 7–232)

## 2011-05-10 LAB — DIFFERENTIAL
Basophils Relative: 1 % (ref 0–1)
Eosinophils Absolute: 0.4 10*3/uL (ref 0.0–0.7)
Eosinophils Relative: 7 % — ABNORMAL HIGH (ref 0–5)
Lymphs Abs: 1.4 10*3/uL (ref 0.7–4.0)
Monocytes Relative: 6 % (ref 3–12)
Neutrophils Relative %: 60 % (ref 43–77)

## 2011-05-10 LAB — CBC
MCH: 25.5 pg — ABNORMAL LOW (ref 26.0–34.0)
MCV: 78.3 fL (ref 78.0–100.0)
Platelets: 274 10*3/uL (ref 150–400)
RBC: 3.41 MIL/uL — ABNORMAL LOW (ref 4.22–5.81)
RDW: 17.3 % — ABNORMAL HIGH (ref 11.5–15.5)

## 2011-05-10 LAB — URINE MICROSCOPIC-ADD ON

## 2011-05-10 LAB — TROPONIN I
Troponin I: <0.3 ng/mL (ref <0.30)
Troponin I: <0.3 ng/mL (ref <0.30)

## 2011-05-10 LAB — PROTIME-INR: Prothrombin Time: 13.9 seconds (ref 11.6–15.2)

## 2011-05-10 NOTE — Discharge Summary (Signed)
NAME:  [name], [name]              ACCOUNT NO.:  [id number]  MEDICAL RECORD NO.:  [id number]           PATIENT TYPE:  I  LOCATION:  4740                         FACILITY:  [facility]  PHYSICIAN:  Rock Nephew, MD       DATE OF BIRTH:  11-04-1935  DATE OF ADMISSION:  04/24/2011 DATE OF DISCHARGE:                        DISCHARGE SUMMARY - REFERRING   PRIMARY CARE PHYSICIAN:  Laurell Josephs, PA at [facility].  DISCHARGE DIAGNOSES: 1. Lower gastrointestinal bleed secondary to hemorrhoids, status post     banding. 2. History of prostate cancer. 3. Coronary artery disease, chronic kidney disease stage IV     contributing prostate cancer and obstructive uropathy. 4. Gout. 5. Debility. 6. Pneumonia. 7. Urinary retention. 8. Iron-deficiency anemia plus anemia of chronic kidney disease.  DISCHARGE MEDICATIONS:  For the patient are as follows; 1. Hydrocortisone 2.5% cream one application rectally twice daily. 2. Hydrocortisone 25 mg suppository one suppository rectally twice     daily as needed for pain. 3. Lasix 20 mg p.o. daily. 4. Levaquin 250 mg p.o. q.24 hours 4 days. 5. Actos 30 mg p.o. daily. 6. Allopurinol 100 mg 2 tablets by mouth daily. 7. Hydralazine 50 mg p.o. three times a day. 8. Toprol-XL 25 mg p.o. every morning. 9. Flomax 0.4 mg 1 tab p.o. daily.  DISPOSITION:  The patient is discharged home.  The patient's diet should be carb-modified plus renal.  FOLLOW UP:  The patient should follow up with the following physicians; 1. The patient should follow up with Laurell Josephs in 1 week at [facility].  The patient should have his CBC and BMET     in 1 week. 2. The patient should follow up with Dr. Marcine Matar in 2 weeks. 3. The patient should follow up with Dr. Lina Sar in 2 weeks. 4. The patient will also get home health nurse and social worker. 5. The patient should also obtain a referral to see a nephrologist.     The  patient should also take over-the-counter iron pill daily.  The     patient should also get a CBC, BMET in 1 week.  CONSULTATIONS ON THIS CASE:  Hedwig Morton. Juanda Chance, MD  PROCEDURES PERFORMED:  The patient had colonoscopy done by Dr. Lina Sar.  The patient had internal hemorrhoids, mild diverticulosis in the sigmoid colon, sessile polyp, otherwise normal examination.  The patient had bleeding, internal hemorrhoid, status post band ligation x5. The patient also had a 2-D echo actually down in Apr 23, 2011 showed a left ventricular ejection fraction of normal looking heart, and valvular regional wall motion abnormalities.  The patient also a grade 1 diastolic dysfunction on the echocardiogram.  The patient's chest x-ray one-view Apr 24, 2011 showed right lower lobe opacity, atelectasis versus pneumonia with suspected right pleural effusion.  BRIEF HISTORY OF PRESENT ILLNESS AND CHIEF COMPLAINT:  Shortness breath. A 76 year old [place] American male with a history prostate cancer, history of chronic kidney disease stage III secondary to obstructive uropathy, secondary to prostate cancer, history of microcytic anemia, hypertension, hyperlipidemia, type 2 diabetes mellitus, history of  CAD, status post NSTEMI in 2007 and shortness of breath.  The patient was found to have gastrointestinal bleed.  HOSPITAL COURSE: 1. Gastrointestinal bleed.  The patient was transfused.  The patient     had colonoscopy and band ligation done by Dr. Lina Sar.  The     patient's hemoglobin stayed relatively stable.  However, the     patient has microcytic anemia.  The patient is urged to take iron     daily at home.  The patient is instructed that the patient will     have some bleeding going on, on and off for about 1 to 2 weeks.     The patient also received rectal steroid suppositories and cream. 2. Prostate cancer.  The patient has prostate cancer.  The patient     should continue follow up with Dr.  Retta Diones. 3. Coronary artery disease.  The patient is stable from coronary     artery disease standpoint.  Currently, the patient will not be     getting any aspirin currently secondary to GI bleed. 4. Chronic kidney disease stage IV.  The patient has stage IV chronic     kidney disease.  This is thought to be related to obstructive     uropathy.  The patient is on Flomax.  The patient should also     obtain referral to see a nephrologist as an outpatient. 5. Gout.  The patient has a history of gout.  The patient was taking     both allopurinol and Uloric.  The Uloric was discontinued because     of the patient's kidney function. 6. Debility.  The patient was seen in consultation with PT, OT and     they reported that the patient did not have any PT, OT needs at     this time. 7. Pneumonia.  The patient has the possible pneumonia.  The patient     was treated with Levaquin during the hospitalization.  The patient     will complete a 10-day course. 8. Urinary retention.  The patient was discovered to have urinary     retention.  The patient was started back on Flomax.  Bladder scans     are done at 3:30.  However, the patient is refusing in and out     catheters. 9. Iron-deficiency anemia and anemia of chronic kidney disease.     Again, the patient should see a nephrologist to see if the patient     would be a candidate for erythropoietin or not.  The patient should     take over-the-counter iron     supplements. 10.For DVT prophylaxis.  The patient received SCDs for DVT     prophylaxis.  We will also set up the patient with home health, social work, and home health nurse.     Rock Nephew, MD     NH/MEDQ  D:  04/29/2011  T:  04/29/2011  Job:  [id number]  cc:   Laurell Josephs, PA Bertram Millard. Dahlstedt, M.D. Hedwig Morton. Juanda Chance, MD  Electronically Signed by Rock Nephew MD on 05/10/2011 12:36:16 PM
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
## 2011-05-17 ENCOUNTER — Emergency Department (HOSPITAL_COMMUNITY): Payer: Medicare Other

## 2011-05-17 ENCOUNTER — Emergency Department (HOSPITAL_COMMUNITY)
Admission: EM | Admit: 2011-05-17 | Discharge: 2011-05-17 | Disposition: A | Discharge status: Home / Self Care | Payer: Medicare Other | Attending: Emergency Medicine | Admitting: Emergency Medicine

## 2011-05-17 DIAGNOSIS — I252 Old myocardial infarction: Secondary | ICD-10-CM | POA: Insufficient documentation

## 2011-05-17 DIAGNOSIS — D649 Anemia, unspecified: Secondary | ICD-10-CM | POA: Insufficient documentation

## 2011-05-17 DIAGNOSIS — N289 Disorder of kidney and ureter, unspecified: Secondary | ICD-10-CM | POA: Insufficient documentation

## 2011-05-17 DIAGNOSIS — R0609 Other forms of dyspnea: Secondary | ICD-10-CM | POA: Insufficient documentation

## 2011-05-17 DIAGNOSIS — I10 Essential (primary) hypertension: Secondary | ICD-10-CM | POA: Insufficient documentation

## 2011-05-17 DIAGNOSIS — J9801 Acute bronchospasm: Secondary | ICD-10-CM | POA: Insufficient documentation

## 2011-05-17 DIAGNOSIS — J40 Bronchitis, not specified as acute or chronic: Secondary | ICD-10-CM | POA: Insufficient documentation

## 2011-05-17 DIAGNOSIS — R0989 Other specified symptoms and signs involving the circulatory and respiratory systems: Secondary | ICD-10-CM | POA: Insufficient documentation

## 2011-05-17 LAB — DIFFERENTIAL
Basophils Absolute: 0 10*3/uL (ref 0.0–0.1)
Lymphocytes Relative: 33 % (ref 12–46)
Lymphs Abs: 1.8 10*3/uL (ref 0.7–4.0)
Monocytes Absolute: 0.6 10*3/uL (ref 0.1–1.0)
Neutro Abs: 2.7 10*3/uL (ref 1.7–7.7)

## 2011-05-17 LAB — BASIC METABOLIC PANEL
BUN: 42 mg/dL — ABNORMAL HIGH (ref 6–23)
Chloride: 105 mEq/L (ref 96–112)
Glucose, Bld: 177 mg/dL — ABNORMAL HIGH (ref 70–99)
Potassium: 3.9 mEq/L (ref 3.5–5.1)

## 2011-05-17 LAB — CBC
HCT: 24.4 % — ABNORMAL LOW (ref 39.0–52.0)
Hemoglobin: 8 g/dL — ABNORMAL LOW (ref 13.0–17.0)
MCV: 78.2 fL (ref 78.0–100.0)
WBC: 5.4 10*3/uL (ref 4.0–10.5)

## 2011-05-19 ENCOUNTER — Other Ambulatory Visit: Payer: Self-pay | Admitting: Urology

## 2011-05-19 DIAGNOSIS — C61 Malignant neoplasm of prostate: Secondary | ICD-10-CM

## 2011-05-26 ENCOUNTER — Ambulatory Visit (HOSPITAL_COMMUNITY): Payer: Medicare Other

## 2011-05-26 ENCOUNTER — Encounter (HOSPITAL_COMMUNITY): Payer: Medicare Other
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
## 2011-05-31 ENCOUNTER — Other Ambulatory Visit (HOSPITAL_COMMUNITY): Payer: Medicare Other

## 2011-06-06 ENCOUNTER — Ambulatory Visit (HOSPITAL_COMMUNITY): Payer: Medicare Other

## 2011-06-06 ENCOUNTER — Ambulatory Visit (HOSPITAL_COMMUNITY): Admission: RE | Admit: 2011-06-06 | Payer: Medicare Other | Source: Ambulatory Visit

## 2011-06-06 ENCOUNTER — Encounter (HOSPITAL_COMMUNITY): Payer: Medicare Other

## 2011-06-06 ENCOUNTER — Other Ambulatory Visit: Payer: Self-pay | Admitting: Urology

## 2011-06-06 LAB — CBC
MCH: 24.6 pg — ABNORMAL LOW (ref 26.0–34.0)
Platelets: 286 10*3/uL (ref 150–400)
RBC: 3.46 MIL/uL — ABNORMAL LOW (ref 4.22–5.81)
WBC: 6.6 10*3/uL (ref 4.0–10.5)

## 2011-06-06 LAB — COMPREHENSIVE METABOLIC PANEL
BUN: 24 mg/dL — ABNORMAL HIGH (ref 6–23)
CO2: 21 mEq/L (ref 19–32)
Chloride: 103 mEq/L (ref 96–112)
Creatinine, Ser: 2.59 mg/dL — ABNORMAL HIGH (ref 0.50–1.35)
GFR calc Af Amer: 30 mL/min — ABNORMAL LOW (ref >60)
GFR calc non Af Amer: 24 mL/min — ABNORMAL LOW (ref >60)
Glucose, Bld: 281 mg/dL — ABNORMAL HIGH (ref 70–99)
Total Bilirubin: 0.4 mg/dL (ref 0.3–1.2)

## 2011-06-06 LAB — SURGICAL PCR SCREEN
MRSA, PCR: NEGATIVE
Staphylococcus aureus: POSITIVE — AB

## 2011-06-08 ENCOUNTER — Other Ambulatory Visit: Payer: Self-pay | Admitting: Urology

## 2011-06-08 ENCOUNTER — Inpatient Hospital Stay (HOSPITAL_COMMUNITY)
Admission: RE | Admit: 2011-06-08 | Discharge: 2011-06-10 | DRG: 713 | Disposition: A | Discharge status: Home / Self Care | Payer: Medicare Other | Source: Ambulatory Visit | Attending: Urology | Admitting: Urology

## 2011-06-08 DIAGNOSIS — C61 Malignant neoplasm of prostate: Principal | ICD-10-CM | POA: Diagnosis present

## 2011-06-08 DIAGNOSIS — N320 Bladder-neck obstruction: Secondary | ICD-10-CM | POA: Diagnosis present

## 2011-06-08 DIAGNOSIS — C7951 Secondary malignant neoplasm of bone: Secondary | ICD-10-CM | POA: Diagnosis present

## 2011-06-08 DIAGNOSIS — I129 Hypertensive chronic kidney disease with stage 1 through stage 4 chronic kidney disease, or unspecified chronic kidney disease: Secondary | ICD-10-CM | POA: Diagnosis present

## 2011-06-08 DIAGNOSIS — IMO0002 Reserved for concepts with insufficient information to code with codable children: Secondary | ICD-10-CM

## 2011-06-08 DIAGNOSIS — N189 Chronic kidney disease, unspecified: Secondary | ICD-10-CM | POA: Diagnosis present

## 2011-06-08 DIAGNOSIS — E119 Type 2 diabetes mellitus without complications: Secondary | ICD-10-CM | POA: Diagnosis present

## 2011-06-08 DIAGNOSIS — R339 Retention of urine, unspecified: Secondary | ICD-10-CM | POA: Diagnosis present

## 2011-06-08 DIAGNOSIS — I2510 Atherosclerotic heart disease of native coronary artery without angina pectoris: Secondary | ICD-10-CM | POA: Diagnosis present

## 2011-06-08 DIAGNOSIS — N1330 Unspecified hydronephrosis: Secondary | ICD-10-CM | POA: Diagnosis present

## 2011-06-08 DIAGNOSIS — Z01812 Encounter for preprocedural laboratory examination: Secondary | ICD-10-CM

## 2011-06-08 LAB — GLUCOSE, CAPILLARY: Glucose-Capillary: 174 mg/dL — ABNORMAL HIGH (ref 70–99)

## 2011-06-08 LAB — TYPE AND SCREEN
ABO/RH(D): O POS
Antibody Screen: NEGATIVE

## 2011-06-10 NOTE — Op Note (Signed)
NAMESHAHAN, STARKS NO.:  [id number]  MEDICAL RECORD NO.:  [id number]  LOCATION:  0007                         FACILITY:  [facility]  PHYSICIAN:  Bertram Millard. Eleazar Kimmey, M.D.DATE OF BIRTH:  03/03/1935  DATE OF PROCEDURE:  06/08/2011 DATE OF DISCHARGE:                              OPERATIVE REPORT   PREOPERATIVE DIAGNOSIS:  Obstructive prostate cancer with urinary retention.  POSTOPERATIVE DIAGNOSES:  Obstructive prostate cancer with urinary retention.  PRINCIPAL PROCEDURE:  Cystoscopy, TURP.  SURGEON:  Bertram Millard. Seylah Wernert, M.D.  ANESTHESIA:  General endotracheal.  COMPLICATIONS:  None.  SPECIMEN:  Prostate chips to pathology.  DRAINS:  22-French Foley catheter with three-way irrigation.  BRIEF HISTORY:  [name] is a very nice 76 year old male with castrate-resistant prostate cancer.  The patient has bony metastases as well.  He was originally diagnosed several years ago, specifically in December 2007.  At that time, he was obstructed from his prostate, had bilateral hydronephrosis.  TURP was performed, revealing Gleason 4+4 adenocarcinoma.  The patient was put on hormonal deprivation at that time due to obvious extraprostatic disease.  He has progressed to castrate-resistant state but is relatively asymptomatic, except for urinary retention due to his prostatic row.  He has been needing a catheter, as he has elevated postvoid residuals and recurrent hydronephrosis.  His creatinine has been in the low 3 range.  He presents at this time for TURP.  Risks and complications have been discussed with the patient.  He understands these and desires to proceed.  DESCRIPTION OF PROCEDURE:  The patient was identified in the holding area.  He was taken to the operating room where general anesthesia was administered.  He was placed in dorsal lithotomy position.  Preoperative IV antibiotics administered.  Time-out was then performed.  A resectoscope sheath was  then placed into his bladder.  Inspection of the urethra proximally revealed some small nodular growth consisting a very small prostatic cancer metastases.  His prostate itself was significantly lobular with evidence of cancer growth.  This was very asymmetric.  Most of this was intravesical in nature.  I was unable to expect/see the ureteral orifices.  Resection was then performed of all these intravesical lobular tissue.  There was very little bleeding from this.  Resection was carried out circumferentially around the bladder neck, such that no intralobular tissue was remaining, and the prostatic fossa was fairly neatly trimmed.  I did not over resected as I did not want to cause urinary incontinence.  Following resection of the significant amount of the obstructing/lobular tissue, I put the gyrus button on.  The tissue was cauterized circumferentially.  Inspection of the prostatic fossa and bladder neck after the chips were irrigated revealed no significant bleeding and an obvious patent prostatic urethra.  At this point, a three-way Foley catheter/22-French was placed and 30 mL was placed in the balloon.  This was hooked to continuous bladder irrigation.  The patient tolerated procedure well.  The prostate chips were sent to pathology.  He was then awakened and taken to PACU in stable condition.     Bertram Millard. Soriyah Osberg, M.D.     SMD/MEDQ  D:  06/08/2011  T:  06/08/2011  Job:  [id number]  cc:   Benjiman Core, M.D. Fax: [phone]  Electronically Signed by Marcine Matar M.D. on 06/10/2011 04:51:47 PM

## 2011-06-10 NOTE — Discharge Summary (Signed)
  [name], MACPHEE NO.:  [id number]  MEDICAL RECORD NO.:  [id number]  LOCATION:  1445                         FACILITY:  [facility]  PHYSICIAN:  Bertram Millard. Brendia Dampier, M.D.DATE OF BIRTH:  [birth year]  DATE OF ADMISSION:  06/08/2011 DATE OF DISCHARGE:  06/10/2011                              DISCHARGE SUMMARY   REASON FOR ADMISSION:  Bladder outlet obstruction.  BRIEF HISTORY:  The patient is a 76 year old male with several-year history of adenocarcinoma of the prostate.  This was originally diagnosed when the patient was admitted several years ago for heart issues.  He was found to have bilateral hydronephrosis and renal insufficiency.  He had bladder outlet obstruction.  He had a nodular prostate and a PSA that was significantly elevated.  He initially underwent TURP.  Pathology revealed high-grade adenocarcinoma of the prostate.  There was obvious evidence of extraprostatic spread.  He has been on hormonal normal therapy, albeit intermittently, since that time. He has redeveloped hydronephrosis secondary to bladder outlet obstruction.  This has necessitated catheter placement.  The patient presents at this time for TURP for management of his bladder outlet obstruction and hydronephrosis.  HOSPITAL COURSE:  The patient was admitted on June 08, 2011.  He went to the operating room where under general anesthetic TURP was performed. He tolerated the procedure well.  Postoperatively he did well.  He failed a voiding trial initially but later on postoperative day #1 he passed some large clots and his bladder emptied appropriately.  He had a good stream and his urine was clear.  He was discharged on postoperative day #2 in improved condition.  He will follow up in my office on July 06, 2011.  He was discharged on Cipro, Colace, his regular home medications and his Rapaflo.     Bertram Millard. [name], M.D.     SMD/MEDQ  D:  06/10/2011  T:  06/10/2011  Job:   [id number]  Electronically Signed by Marcine Matar M.D. on 06/10/2011 04:51:51 PM

## 2011-06-20 ENCOUNTER — Ambulatory Visit: Payer: Medicare Other | Admitting: Gastroenterology

## 2011-07-17 NOTE — H&P (Signed)
[name], NGHIEM NO.:  [id number]  MEDICAL RECORD NO.:  [id number]  LOCATION:  1445                         FACILITY:  [facility]  PHYSICIAN:  [name] Millard. Halen [name], M.D.DATE OF BIRTH:  Aug 03, 1935  DATE OF ADMISSION:  06/08/2011 DATE OF DISCHARGE:  06/10/2011                             HISTORY & PHYSICAL   REASON FOR ADMISSION:  Bladder outlet obstruction secondary to recurrent adenocarcinoma of prostate.  BRIEF HISTORY:  Mr. [name] is a 76 year old male who I saw several years ago for urinary tract obstruction while inpatient in the hospital as a consultation.  At that time, he was in the hospital for cardiac issues.  He was found to have bilateral hydronephrosis and secondary renal insufficiency secondary to a large prostate.  His prostate was nodular and his PSA was significantly elevated.  He underwent TURP to treat his immediate problem.  Pathology at that time revealed high-grade adenocarcinoma of the prostate with obvious evidence of extraprostatic spread on CT scan.  He has been on hormonal therapy, although he has not been terribly compliant with his followup in the office.  He has been found to have bladder outlet obstruction and hydronephrosis with renal insufficiency.  He has had a catheter in place.  He presents at this time for TURP.  PAST MEDICAL HISTORY:  Significant for prostate cancer and he has had intermittent hormonal therapy.  He does have metastatic disease within his bones and has been seen by Dr. Eli [name] for that.  He has a history of myocardial infarction, arthritis, BPH with obstruction, diabetes mellitus, gout, hypercholesterolemia, hypertension, and a prior history of pneumonia.  He has had a prior TURP.  MEDICATIONS: 1. Hydralazine 50 mg t.i.d. 2. Actos 30 mg q.a.m. 3. Lasix 20 mg q.a.m. 4. Metoprolol XL 25 mg q.a.m. 5. Prednisone 15 mg daily. 6. Iron sulfate 325 mg one t.i.d. 7. He had been on Rapaflo recently, that  was stopped after his Foley     catheter was placed.  ALLERGIES:  FELDENE.  SOCIAL HISTORY:  The patient is currently single.  He lives by myself. He does have children, although they are not terribly supportive.  The patient is currently retired.  He denies alcohol use.  He does have a history of cigarette abuse in the past, which is not recent.  PHYSICAL EXAMINATION:  GENERAL:  Exam revealed a pleasant elderly male. He is in no acute distress. HEENT:  Normal.  Dentition was poor. NECK:  Supple without thyromegaly or adenopathy. CHEST: Clear. HEART:  Normal rate and rhythm. ABDOMEN:  Protuberant, soft, nondistended, nontender.  No mass, no megaly.  Phallus uncircumcised.  Foley catheter was present. RECTAL:  Revealed a nodular gland, which was enlarged.  There were no rectal masses. EXTREMITIES:  There was 2+ pretibial edema bilaterally. NEUROLOGIC:  Exam was grossly intact.  ADMISSION DATA:  Hematocrit 26.9%, white count 6600, platelet count 266,000.  BMET; sodium 134, potassium 4.6, chloride 103, bicarb 21, glucose 281, BUN 24, creatinine 2.6.  IMPRESSION: 1. Bladder outlet obstruction secondary to enlarging prostate gland     with bilateral hydronephrosis. 2. Advanced prostate cancer, on hormonal therapy. 3. History of coronary artery disease. 4. Anemia.  PLAN: 1. Admit for TURP. 2. Hopefully, we will treat his outlet obstruction adequately with a     TURP, which will result in better upper tract drainage/management     of his renal insufficiency.     [name] Millard. [name], M.D.     SMD/MEDQ  D:  07/09/2011  T:  07/09/2011  Job:  [id number]  Electronically Signed by [name] M.D. on 07/17/2011 01:31:06 PM

## 2011-10-18 ENCOUNTER — Encounter (HOSPITAL_COMMUNITY): Payer: Medicare Other

## 2011-10-18 ENCOUNTER — Ambulatory Visit (HOSPITAL_COMMUNITY): Payer: Medicare Other

## 2011-12-15 ENCOUNTER — Inpatient Hospital Stay (HOSPITAL_COMMUNITY): Admission: RE | Admit: 2011-12-15 | Payer: Medicare Other | Source: Ambulatory Visit

## 2011-12-15 ENCOUNTER — Other Ambulatory Visit: Payer: Self-pay | Admitting: Urology

## 2011-12-15 ENCOUNTER — Inpatient Hospital Stay (HOSPITAL_COMMUNITY)
Admission: RE | Admit: 2011-12-15 | Discharge: 2011-12-15 | Payer: Medicare Other | Source: Ambulatory Visit | Attending: Urology | Admitting: Urology

## 2011-12-15 DIAGNOSIS — C61 Malignant neoplasm of prostate: Secondary | ICD-10-CM

## 2011-12-20 ENCOUNTER — Inpatient Hospital Stay (HOSPITAL_COMMUNITY): Admission: RE | Admit: 2011-12-20 | Payer: Medicare Other | Source: Ambulatory Visit

## 2011-12-20 ENCOUNTER — Ambulatory Visit (HOSPITAL_COMMUNITY): Payer: Medicare Other

## 2012-02-09 ENCOUNTER — Encounter (HOSPITAL_COMMUNITY)
Admission: RE | Admit: 2012-02-09 | Discharge: 2012-02-09 | Disposition: A | Discharge status: Home / Self Care | Payer: Medicare Other | Source: Ambulatory Visit | Attending: Urology | Admitting: Urology

## 2012-02-09 ENCOUNTER — Encounter (HOSPITAL_COMMUNITY): Payer: Medicare Other

## 2012-02-09 ENCOUNTER — Ambulatory Visit (HOSPITAL_COMMUNITY): Payer: Medicare Other

## 2012-02-09 DIAGNOSIS — C61 Malignant neoplasm of prostate: Secondary | ICD-10-CM | POA: Insufficient documentation

## 2012-02-09 DIAGNOSIS — C7951 Secondary malignant neoplasm of bone: Secondary | ICD-10-CM | POA: Insufficient documentation

## 2012-02-09 DIAGNOSIS — C7952 Secondary malignant neoplasm of bone marrow: Secondary | ICD-10-CM | POA: Insufficient documentation

## 2012-02-09 MED ORDER — TECHNETIUM TC 99M MEDRONATE IV KIT
25.0000 | PACK | Freq: Once | INTRAVENOUS | Status: AC | PRN
  Administered 2012-02-09: 25 via INTRAVENOUS

## 2012-03-22 ENCOUNTER — Other Ambulatory Visit: Payer: Self-pay | Admitting: Oncology

## 2012-03-22 ENCOUNTER — Telehealth: Payer: Self-pay | Admitting: Oncology

## 2012-03-22 DIAGNOSIS — C61 Malignant neoplasm of prostate: Secondary | ICD-10-CM

## 2012-03-22 NOTE — Telephone Encounter (Signed)
S/W PT RE APPT FOR 5/7.

## 2012-03-23 NOTE — Progress Notes (Signed)
Received progress notes from Dr. Marcine Matar @ [facility]; forwarded to Dr. Clelia Croft.

## 2012-04-03 ENCOUNTER — Telehealth: Payer: Self-pay | Admitting: Internal Medicine

## 2012-04-03 ENCOUNTER — Other Ambulatory Visit (HOSPITAL_BASED_OUTPATIENT_CLINIC_OR_DEPARTMENT_OTHER): Payer: Medicare Other | Admitting: Lab

## 2012-04-03 ENCOUNTER — Telehealth: Payer: Self-pay | Admitting: Oncology

## 2012-04-03 ENCOUNTER — Ambulatory Visit (HOSPITAL_BASED_OUTPATIENT_CLINIC_OR_DEPARTMENT_OTHER): Payer: Medicare Other | Admitting: Oncology

## 2012-04-03 VITALS — BP 169/96 | HR 110 | Temp 97.2°F | Ht 65.0 in | Wt 204.5 lb

## 2012-04-03 DIAGNOSIS — C61 Malignant neoplasm of prostate: Secondary | ICD-10-CM

## 2012-04-03 DIAGNOSIS — C7951 Secondary malignant neoplasm of bone: Secondary | ICD-10-CM

## 2012-04-03 LAB — CBC WITH DIFFERENTIAL/PLATELET
Eosinophils Absolute: 0.3 10*3/uL (ref 0.0–0.5)
MCV: 84.2 fL (ref 79.3–98.0)
MONO%: 7.1 % (ref 0.0–14.0)
NEUT#: 4.4 10*3/uL (ref 1.5–6.5)
RBC: 4.11 10*6/uL — ABNORMAL LOW (ref 4.20–5.82)
RDW: 16.3 % — ABNORMAL HIGH (ref 11.0–14.6)
WBC: 7.1 10*3/uL (ref 4.0–10.3)
nRBC: 0 % (ref 0–0)

## 2012-04-03 NOTE — Progress Notes (Signed)
Hematology and Oncology Follow Up Visit  Yaseen Gilberg [id number] 08-01-1935 77 y.o. 04/03/2012 3:38 PM  CC:  Rollene Rotunda, M.D.  Bertram Millard. Dahlstedt, M.D.   Principle Diagnosis: This is a 77 year old gentleman with prostate cancer initially diagnosed in 2007, PSA of 55 and Gleason 4 + 4 = 8 presenting with advanced disease, obstructive neuropathy and sclerotic bony metastasis.  Prior Therapy:  1.  Treating initially with Lupron and excellent PSA response from 55 to almost close to 0 level. 2.  PSA increased up to 12 and in June 2010 Casodex was added and dropped to 2.21.  Most recently he has slow rise in his PSA that has been transient.  His most recent PSA in February 2011 was 4.64 again that was down from 9.01 in June 2010, again down from 6.57 in January 2011.   Current therapy: He is on Lupron and Casodex.  Lupron he is given by Dr. Lenoria Chime office.  Site of disease includes bony metastasis at this time.  Interim History:  Mr. Amico presents today for an office follow-up visit.  This is a pleasant gentleman with combined androgen deprivation.  He is a man whom I have seen last in 06/2010. Since that time, his PSA went up to 181 and his testosterone was 135 on 03/05/2012. Patient was restarted on Lupron and Casodex  By Dr. Retta Diones.  Currently Mr. Bais is asymptomatic without any bony pain, without any weight loss or early satiety.  He does not report any other complaints for the time being.  His performance and activity level remain at baseline.  He denies any nausea, vomiting, any constipation, diarrhea, lower extremity weakness, lower extremity swelling, bowel or bladder incontinence or any back pain.  Medications: I have reviewed the patient's current medications. Current outpatient prescriptions:colchicine 0.6 MG tablet, Take 0.6 mg by mouth every 8 (eight) hours.  , Disp: , Rfl: ;  hydrALAZINE (APRESOLINE) 25 MG tablet, Take 25 mg by mouth 3 (three) times daily.  , Disp: ,  Rfl: ;  isosorbide mononitrate (IMDUR) 60 MG 24 hr tablet, Take 60 mg by mouth daily.  , Disp: , Rfl: ;  lisinopril (PRINIVIL,ZESTRIL) 10 MG tablet, Take 10 mg by mouth daily.  , Disp: , Rfl:  metoprolol (LOPRESSOR) 50 MG tablet, Take 50 mg by mouth 2 (two) times daily.  , Disp: , Rfl: ;  nitroGLYCERIN (NITROSTAT) 0.4 MG SL tablet, Place 0.4 mg under the tongue every 5 (five) minutes as needed.  , Disp: , Rfl: ;  simvastatin (ZOCOR) 20 MG tablet, Take 20 mg by mouth at bedtime.  , Disp: , Rfl: ;  sitaGLIPtan (JANUVIA) 50 MG tablet, Take 50 mg by mouth daily.  , Disp: , Rfl:   Allergies:  Allergies  Allergen Reactions  . Aspirin     Past Medical History, Surgical history, Social history, and Family History were reviewed and updated.  Review of Systems: Constitutional:  Negative for fever, chills, night sweats, anorexia, weight loss, pain. Cardiovascular: no chest pain or dyspnea on exertion Respiratory: negative Neurological: negative Dermatological: negative ENT: negative Skin: Negative. Gastrointestinal: no abdominal pain, change in bowel habits, or black or bloody stools Genito-Urinary: negative Hematological and Lymphatic: negative Breast: negative Musculoskeletal: negative Remaining ROS negative. Physical Exam: Blood pressure 169/96, pulse 110, temperature 97.2 F (36.2 C), temperature source Oral, height 5\' 5"  (1.651 m), weight 204 lb 8 oz (92.761 kg). ECOG: 1 General appearance: alert Head: Normocephalic, without obvious abnormality, atraumatic Neck: no adenopathy, no carotid bruit,  no JVD, supple, symmetrical, trachea midline and thyroid not enlarged, symmetric, no tenderness/mass/nodules Lymph nodes: Cervical, supraclavicular, and axillary nodes normal. Heart:regular rate and rhythm, S1, S2 normal, no murmur, click, rub or gallop Lung:chest clear, no wheezing, rales, normal symmetric air entry Abdomin: soft, non-tender, without masses or organomegaly EXT:no erythema,  induration, or nodules   Lab Results: Lab Results  Component Value Date   WBC 7.1 04/03/2012   HGB 10.8* 04/03/2012   HCT 34.6* 04/03/2012   MCV 84.2 04/03/2012   PLT 278 04/03/2012     Chemistry      Component Value Date/Time   NA 134* 06/06/2011 1200   K 4.6 06/06/2011 1200   CL 103 06/06/2011 1200   CO2 21 06/06/2011 1200   BUN 24* 06/06/2011 1200   CREATININE 2.59* 06/06/2011 1200      Component Value Date/Time   CALCIUM 8.8 06/06/2011 1200   ALKPHOS 101 06/06/2011 1200   AST 14 06/06/2011 1200   ALT 13 06/06/2011 1200   BILITOT 0.4 06/06/2011 1200     NUCLEAR MEDICINE WHOLE BODY BONE SCINTIGRAPHY 02/09/2012 Technique: Whole body anterior and posterior images were obtained  approximately 3 hours after intravenous injection of  radiopharmaceutical.  Radiopharmaceutical: CURIE TC-MDP TECHNETIUM TC 25M  MEDRONATE IV KIT  Comparison: Bone scan dated 02/19/2010  Findings: The patient has developed multiple metastatic lesions  including the proximal right humeral shaft, T11, T7, T4, and in the  proximal left femur just below the right greater trochanter, S1  segment of the sacrum right seventh rib and in the right ilium.  Possible lesion in the inferior tip of the right scapula.  IMPRESSION:  Multiple new bone metastases. The most prominent lesion is in the  proximal right humeral shaft.  Impression and Plan:  This is a pleasant 77 year old gentleman with following issues. 1.  Metastatic prostate cancer disease to the bone.  He is currently on combined androgen deprivation with Lupron and Casodexl.  His PSA has went up to 181 but he was off Lupron for a while and his testosterone has not been castrate.  I will repeat his PSA today and if his PSA continue to rise  he would be a reasonable candidate for second line hormonal manipulation with ketoconazole and prednisone or Zytiga. 2.  Bony disease: Rivka Barbara was started at [facility] on 4/25. 3. Follow up in 6 weeks.    Marland Kitchen Eli Hose,  MD 5/7/20133:38 PM

## 2012-04-03 NOTE — Telephone Encounter (Signed)
appts made and printed for pt aom °

## 2012-04-03 NOTE — Telephone Encounter (Signed)
Left Message - I tried calling [name] at [phone] to inform him of a critical lab value reported to me this evening by [facility] labs but no answer; Spoke with daughter [name] who will contact the patient and have him call back at my pager number.

## 2012-04-04 LAB — COMPREHENSIVE METABOLIC PANEL
ALT: 11 U/L (ref 0–53)
AST: 16 U/L (ref 0–37)
Alkaline Phosphatase: 163 U/L — ABNORMAL HIGH (ref 39–117)
CO2: 16 mEq/L — ABNORMAL LOW (ref 19–32)
Sodium: 137 mEq/L (ref 135–145)
Total Bilirubin: 0.5 mg/dL (ref 0.3–1.2)
Total Protein: 7 g/dL (ref 6.0–8.3)

## 2012-04-05 ENCOUNTER — Encounter: Payer: Medicare Other | Admitting: Cardiology

## 2012-04-24 ENCOUNTER — Encounter: Payer: Medicare Other | Admitting: Cardiology

## 2012-04-27 ENCOUNTER — Encounter: Payer: Self-pay | Admitting: Cardiology

## 2012-05-07 ENCOUNTER — Other Ambulatory Visit: Payer: Self-pay | Admitting: Nephrology

## 2012-05-14 ENCOUNTER — Other Ambulatory Visit: Payer: Medicare Other

## 2012-05-17 ENCOUNTER — Other Ambulatory Visit: Payer: Medicare Other | Admitting: Lab

## 2012-05-17 ENCOUNTER — Ambulatory Visit: Payer: Medicare Other | Admitting: Oncology

## 2012-05-24 ENCOUNTER — Emergency Department (HOSPITAL_COMMUNITY)
Admission: EM | Admit: 2012-05-24 | Discharge: 2012-05-24 | Disposition: A | Discharge status: Home / Self Care | Payer: Medicare Other | Attending: Emergency Medicine | Admitting: Emergency Medicine

## 2012-05-24 ENCOUNTER — Emergency Department (HOSPITAL_COMMUNITY): Payer: Medicare Other

## 2012-05-24 ENCOUNTER — Encounter (HOSPITAL_COMMUNITY): Payer: Self-pay | Admitting: Emergency Medicine

## 2012-05-24 DIAGNOSIS — E785 Hyperlipidemia, unspecified: Secondary | ICD-10-CM | POA: Insufficient documentation

## 2012-05-24 DIAGNOSIS — E1169 Type 2 diabetes mellitus with other specified complication: Secondary | ICD-10-CM | POA: Insufficient documentation

## 2012-05-24 DIAGNOSIS — E669 Obesity, unspecified: Secondary | ICD-10-CM | POA: Insufficient documentation

## 2012-05-24 DIAGNOSIS — I10 Essential (primary) hypertension: Secondary | ICD-10-CM | POA: Insufficient documentation

## 2012-05-24 DIAGNOSIS — R5381 Other malaise: Secondary | ICD-10-CM | POA: Insufficient documentation

## 2012-05-24 DIAGNOSIS — I2510 Atherosclerotic heart disease of native coronary artery without angina pectoris: Secondary | ICD-10-CM | POA: Insufficient documentation

## 2012-05-24 DIAGNOSIS — R5383 Other fatigue: Secondary | ICD-10-CM | POA: Insufficient documentation

## 2012-05-24 DIAGNOSIS — R42 Dizziness and giddiness: Secondary | ICD-10-CM | POA: Insufficient documentation

## 2012-05-24 DIAGNOSIS — E162 Hypoglycemia, unspecified: Secondary | ICD-10-CM

## 2012-05-24 DIAGNOSIS — Z79899 Other long term (current) drug therapy: Secondary | ICD-10-CM | POA: Insufficient documentation

## 2012-05-24 LAB — CBC WITH DIFFERENTIAL/PLATELET
Eosinophils Relative: 4 % (ref 0–5)
HCT: 32.8 % — ABNORMAL LOW (ref 39.0–52.0)
Hemoglobin: 10.3 g/dL — ABNORMAL LOW (ref 13.0–17.0)
Lymphocytes Relative: 24 % (ref 12–46)
Lymphs Abs: 1.1 10*3/uL (ref 0.7–4.0)
MCV: 83.7 fL (ref 78.0–100.0)
Monocytes Absolute: 0.2 10*3/uL (ref 0.1–1.0)
Monocytes Relative: 4 % (ref 3–12)
RBC: 3.92 MIL/uL — ABNORMAL LOW (ref 4.22–5.81)
WBC: 4.6 10*3/uL (ref 4.0–10.5)

## 2012-05-24 LAB — POCT I-STAT, CHEM 8
BUN: 29 mg/dL — ABNORMAL HIGH (ref 6–23)
Calcium, Ion: 1.28 mmol/L (ref 1.12–1.32)
Chloride: 116 mEq/L — ABNORMAL HIGH (ref 96–112)
Glucose, Bld: 108 mg/dL — ABNORMAL HIGH (ref 70–99)
Potassium: 6.2 mEq/L — ABNORMAL HIGH (ref 3.5–5.1)

## 2012-05-24 NOTE — Discharge Instructions (Signed)
You were seen and evaluated for your complaints of dizziness and low blood sugar. At this time your blood sugar has returned to normal and your lab tests do not show any concerning findings or other concerning conditions. At this time your providers feel you may return home and followup with your doctors for continued evaluation and treatment.   Hypoglycemia (Low Blood Sugar) Hypoglycemia is when the glucose (sugar) in your blood is too low. Hypoglycemia can happen for many reasons. It can happen to people with or without diabetes. Hypoglycemia can develop quickly and can be a medical emergency.  CAUSES  Having hypoglycemia does not mean that you will develop diabetes. Different causes include:  Missed or delayed meals or not enough carbohydrates eaten.   Medication overdose. This could be by accident or deliberate. If by accident, your medication may need to be adjusted or changed.   Exercise or increased activity without adjustments in carbohydrates or medications.   A nerve disorder that affects body functions like your heart rate, blood pressure and digestion (autonomic neuropathy).   A condition where the stomach muscles do not function properly (gastroparesis). Therefore, medications may not absorb properly.   The inability to recognize the signs of hypoglycemia (hypoglycemic unawareness).   Absorption of insulin - may be altered.   Alcohol consumption.   Pregnancy/menstrual cycles/postpartum. This may be due to hormones.   Certain kinds of tumors. This is very rare.  SYMPTOMS   Sweating.   Hunger.   Dizziness.   Blurred vision.   Drowsiness.   Weakness.   Headache.   Rapid heart beat.   Shakiness.   Nervousness.  DIAGNOSIS  Diagnosis is made by monitoring blood glucose in one or all of the following ways:  Fingerstick blood glucose monitoring.   Laboratory results.  TREATMENT  If you think your blood glucose is low:  Check your blood glucose, if  possible. If it is less than 70 mg/dl, take one of the following:   3-4 glucose tablets.    cup juice (prefer clear like apple).    cup "regular" soda pop.   1 cup milk.   -1 tube of glucose gel.   5-6 hard candies.   Do not over treat because your blood glucose (sugar) will only go too high.   Wait 15 minutes and recheck your blood glucose. If it is still less than 70 mg/dl (or below your target range), repeat treatment.   Eat a snack if it is more than one hour until your next meal.  Sometimes, your blood glucose may go so low that you are unable to treat yourself. You may need someone to help you. You may even pass out or be unable to swallow. This may require you to get an injection of glucagon, which raises the blood glucose. HOME CARE INSTRUCTIONS  Check blood glucose as recommended by your caregiver.   Take medication as prescribed by your caregiver.   Follow your meal plan. Do not skip meals. Eat on time.   If you are going to drink alcohol, drink it only with meals.   Check your blood glucose before driving.   Check your blood glucose before and after exercise. If you exercise longer or different than usual, be sure to check blood glucose more frequently.   Always carry treatment with you. Glucose tablets are the easiest to carry.   Always wear medical alert jewelry or carry some form of identification that states that you have diabetes. This will alert people  that you have diabetes. If you have hypoglycemia, they will have a better idea on what to do.  SEEK MEDICAL CARE IF:   You are having problems keeping your blood sugar at target range.   You are having frequent episodes of hypoglycemia.   You feel you might be having side effects from your medicines.   You have symptoms of an illness that is not improving after 3-4 days.   You notice a change in vision or a new problem with your vision.  SEEK IMMEDIATE MEDICAL CARE IF:   You are a family member or  friend of a person whose blood glucose goes below 70 mg/dl and is accompanied by:   Confusion.   A change in mental status.   The inability to swallow.   Passing out.  Document Released: 11/14/2005 Document Revised: 11/03/2011 Document Reviewed: 07/09/2009 [facility] Patient Information 2012 [place], [place].

## 2012-05-24 NOTE — ED Notes (Signed)
ZOX:[id number]<AV> Expected date:05/24/12<BR> Expected time: 3:12 AM<BR> Means of arrival:Ambulance<BR> Comments:<BR> hypoglycemia

## 2012-05-24 NOTE — ED Notes (Signed)
CBG at this time=92.

## 2012-05-24 NOTE — ED Notes (Signed)
Pt's present address:  [address].

## 2012-05-24 NOTE — ED Notes (Signed)
EKG old and new given to EDP, [place],MD. 

## 2012-05-24 NOTE — ED Provider Notes (Signed)
Medical screening examination/treatment/procedure(s) were performed by non-physician practitioner and as supervising physician I was immediately available for consultation/collaboration.   Shakesha Soltau L Tymel Conely, MD 05/24/12 [id number] 

## 2012-05-24 NOTE — ED Notes (Signed)
LVM on Emergency Contact (daughter's) number requesting callback.

## 2012-05-24 NOTE — ED Notes (Signed)
Current Clinical research associate asked by Ivonne Andrew PA to call pt and have him return to ED or follow up w/PCP for repeat potassium level today.  Called pts EPIC  # and # is temporarily disconnected.  Called Emergency Contact  # (Daughter) and LVM requesting alternate # for her father or to have him return call to [facility] office @ [phone].

## 2012-05-24 NOTE — ED Notes (Addendum)
Brought in by EMS from home with c/o low blood sugar. Per EMS, pt got out of bed at around 12 midnight and felt weak---pt was going to drive selt to hospital but unable to so pt called 911. Pt's CBG on EMS' arrival at pt's home was 60---pt was given 1 tube of glucogel, pt's CBG went up to 65. Pt presents to ED A/Ox4, denies s/s hypoglycemia at this time: denies weakness, skin warm and dry.

## 2012-05-24 NOTE — ED Notes (Signed)
Pt refused CBC w/ diff and troponin

## 2012-05-24 NOTE — ED Notes (Addendum)
Pt refused cold sandwiches offered, states he does not eat cold sandwiches---refused to have it warmed; pt also refused crackers or peanut butter. Pt states that his last meal of 2 hotdogs was at around 2pm yesterday; has had no snacks since then.

## 2012-05-24 NOTE — ED Provider Notes (Signed)
History     CSN: [id number]  Arrival date & time 05/24/12  1191   First MD Initiated Contact with Patient 05/24/12 [phone]      Chief Complaint  Patient presents with  . Hypoglycemia   HPI  History provided by the patient. Patient is a 77 year old male with history of hypertension, diabetes, CAD and renal sufficiency who presents with concerns for low blood sugar at home. Patient reports that he woke up around midnight tonight and felt weak with some lightheadedness. Patient states that symptoms felt like his blood sugar was low and he was going to come to the emergency room but felt too weak and so he called 911. When EMS arrived patient's blood sugar was 60 and patient was given a tube of. Patient states he started to feel much better on the way to the emergency room. Currently patient denies any other symptoms or complaints. Patient states he's been taking his normal medications as prescribed every day. Patient states that he did not have much to eat all day yesterday and only had 2 hot dogs around 2 PM. Patient denies any other aggravating or alleviating factors. Patient denies any other associated symptoms. He denies any fever, chills, sweats, shortness of breath or chest pains.    Past Medical History  Diagnosis Date  . CAD (coronary artery disease)   . Diabetes mellitus   . Hypertension   . Renal insufficiency   . Obesity   . Dyslipidemia     History reviewed. No pertinent past surgical history.  Family History  Problem Relation Age of Onset  . Diabetes Mother     History  Substance Use Topics  . Smoking status: Former Games developer  . Smokeless tobacco: Not on file  . Alcohol Use: No     former heavy use      Review of Systems  Constitutional: Negative for fever and chills.  Respiratory: Negative for cough and shortness of breath.   Cardiovascular: Negative for chest pain.  Gastrointestinal: Negative for nausea, vomiting and abdominal pain.  Neurological: Positive for  light-headedness. Negative for dizziness and headaches.    Allergies  Aspirin  Home Medications   Current Outpatient Rx  Name Route Sig Dispense Refill  . COLCHICINE 0.6 MG PO TABS Oral Take 0.6 mg by mouth every 8 (eight) hours.      Marland Kitchen HYDRALAZINE HCL 25 MG PO TABS Oral Take 25 mg by mouth 3 (three) times daily.      . ISOSORBIDE MONONITRATE 60 MG PO TB24 Oral Take 60 mg by mouth daily.      Marland Kitchen LISINOPRIL 10 MG PO TABS Oral Take 10 mg by mouth daily.      Marland Kitchen METOPROLOL TARTRATE 50 MG PO TABS Oral Take 50 mg by mouth 2 (two) times daily.      Marland Kitchen NITROGLYCERIN 0.4 MG SL SUBL Sublingual Place 0.4 mg under the tongue every 5 (five) minutes as needed.      Marland Kitchen SIMVASTATIN 20 MG PO TABS Oral Take 20 mg by mouth at bedtime.      Marland Kitchen SITAGLIPTIN PHOSPHATE 50 MG PO TABS Oral Take 50 mg by mouth daily.        BP 181/93  Pulse 75  Temp 97.7 F (36.5 C) (Oral)  Resp 18  SpO2 100%  Physical Exam  Nursing note and vitals reviewed. Constitutional: He is oriented to person, place, and time. He appears well-developed and well-nourished. No distress.  HENT:  Head: Normocephalic.  Cardiovascular: Normal rate  and regular rhythm.   Pulmonary/Chest: Effort normal and breath sounds normal. No respiratory distress. He has no wheezes. He has no rales.  Abdominal: Soft. There is no tenderness. There is no rebound.  Neurological: He is alert and oriented to person, place, and time.  Skin: Skin is warm.  Psychiatric: He has a normal mood and affect. His behavior is normal.    ED Course  Procedures   Results for orders placed during the hospital encounter of 05/24/12  GLUCOSE, CAPILLARY      Component Value Range   Glucose-Capillary 92  70 - 99 mg/dL  CBC WITH DIFFERENTIAL      Component Value Range   WBC 4.6  4.0 - 10.5 K/uL   RBC 3.92 (*) 4.22 - 5.81 MIL/uL   Hemoglobin 10.3 (*) 13.0 - 17.0 g/dL   HCT 16.1 (*) 09.6 - 04.5 %   MCV 83.7  78.0 - 100.0 fL   MCH 26.3  26.0 - 34.0 pg   MCHC 31.4   30.0 - 36.0 g/dL   RDW 40.9 (*) 81.1 - 91.4 %   Platelets 277  150 - 400 K/uL   Neutrophils Relative 68  43 - 77 %   Neutro Abs 3.1  1.7 - 7.7 K/uL   Lymphocytes Relative 24  12 - 46 %   Lymphs Abs 1.1  0.7 - 4.0 K/uL   Monocytes Relative 4  3 - 12 %   Monocytes Absolute 0.2  0.1 - 1.0 K/uL   Eosinophils Relative 4  0 - 5 %   Eosinophils Absolute 0.2  0.0 - 0.7 K/uL   Basophils Relative 1  0 - 1 %   Basophils Absolute 0.0  0.0 - 0.1 K/uL  TROPONIN I      Component Value Range   Troponin I <0.30  <0.30 ng/mL  POCT I-STAT, CHEM 8      Component Value Range   Sodium 139  135 - 145 mEq/L   Potassium 6.2 (*) 3.5 - 5.1 mEq/L   Chloride 116 (*) 96 - 112 mEq/L   BUN 29 (*) 6 - 23 mg/dL   Creatinine, Ser 7.82 (*) 0.50 - 1.35 mg/dL   Glucose, Bld 956 (*) 70 - 99 mg/dL   Calcium, Ion 2.13  0.86 - 1.32 mmol/L   TCO2 16  0 - 100 mmol/L   Hemoglobin 11.6 (*) 13.0 - 17.0 g/dL   HCT 57.8 (*) 46.9 - 62.9 %       Dg Chest 2 View  05/24/2012  *RADIOLOGY REPORT*  Clinical Data: Shortness of breath.  CHEST - 2 VIEW  Comparison: 05/17/2011  Findings: Normal heart size and pulmonary vascularity.  Calcified and tortuous aorta.  Fine interstitial changes suggesting chronic bronchitis.  No focal airspace consolidation in the lungs.  No blunting of costophrenic angles.  No pneumothorax.  No significant change since previous study.  IMPRESSION: Chronic bronchitic changes.  No evidence of active pulmonary disease.  Original Report Authenticated By: Marlon Pel, M.D.     1. Hypoglycemia       MDM  4:20AM patient seen and evaluated. Patient no acute distress.  Patient continues to be without any complaints. Blood sugar upon arrival was 92.  Patient with baseline renal insufficiency. he feels ready to be discharged home.     Date: 05/24/2012  Rate: 79  Rhythm: normal sinus rhythm  QRS Axis: left  Intervals: normal  ST/T Wave abnormalities: normal  Conduction Disutrbances:left bundle  branch block  Narrative Interpretation:   Old EKG Reviewed: QRS is slightly widened with signs for LBBB   Patient was discharged home feeling well. I overlooked his elevated potassium level and so I contacted the flow manager.  They will contact the patient to urge his return to the emergency room or close follow up with PCP today to have a recheck of his level.       Angus Seller, [place] 05/24/12 [phone]

## 2012-05-25 ENCOUNTER — Encounter (HOSPITAL_COMMUNITY): Payer: Self-pay | Admitting: *Deleted

## 2012-05-25 ENCOUNTER — Inpatient Hospital Stay (HOSPITAL_COMMUNITY)
Admission: EM | Admit: 2012-05-25 | Discharge: 2012-05-27 | DRG: 638 | Disposition: A | Discharge status: Home Health | Payer: Medicare Other | Attending: Internal Medicine | Admitting: Internal Medicine

## 2012-05-25 ENCOUNTER — Emergency Department (HOSPITAL_COMMUNITY): Payer: Medicare Other

## 2012-05-25 DIAGNOSIS — E785 Hyperlipidemia, unspecified: Secondary | ICD-10-CM

## 2012-05-25 DIAGNOSIS — I129 Hypertensive chronic kidney disease with stage 1 through stage 4 chronic kidney disease, or unspecified chronic kidney disease: Secondary | ICD-10-CM | POA: Diagnosis present

## 2012-05-25 DIAGNOSIS — N390 Urinary tract infection, site not specified: Secondary | ICD-10-CM | POA: Diagnosis present

## 2012-05-25 DIAGNOSIS — E871 Hypo-osmolality and hyponatremia: Secondary | ICD-10-CM | POA: Diagnosis present

## 2012-05-25 DIAGNOSIS — Z8739 Personal history of other diseases of the musculoskeletal system and connective tissue: Secondary | ICD-10-CM

## 2012-05-25 DIAGNOSIS — E875 Hyperkalemia: Secondary | ICD-10-CM

## 2012-05-25 DIAGNOSIS — I252 Old myocardial infarction: Secondary | ICD-10-CM

## 2012-05-25 DIAGNOSIS — E162 Hypoglycemia, unspecified: Secondary | ICD-10-CM | POA: Diagnosis present

## 2012-05-25 DIAGNOSIS — N259 Disorder resulting from impaired renal tubular function, unspecified: Secondary | ICD-10-CM

## 2012-05-25 DIAGNOSIS — R531 Weakness: Secondary | ICD-10-CM

## 2012-05-25 DIAGNOSIS — E1169 Type 2 diabetes mellitus with other specified complication: Principal | ICD-10-CM | POA: Diagnosis present

## 2012-05-25 DIAGNOSIS — N189 Chronic kidney disease, unspecified: Secondary | ICD-10-CM | POA: Diagnosis present

## 2012-05-25 DIAGNOSIS — I10 Essential (primary) hypertension: Secondary | ICD-10-CM | POA: Diagnosis present

## 2012-05-25 DIAGNOSIS — E669 Obesity, unspecified: Secondary | ICD-10-CM

## 2012-05-25 DIAGNOSIS — E119 Type 2 diabetes mellitus without complications: Secondary | ICD-10-CM

## 2012-05-25 DIAGNOSIS — N289 Disorder of kidney and ureter, unspecified: Secondary | ICD-10-CM | POA: Insufficient documentation

## 2012-05-25 HISTORY — DX: Personal history of other diseases of the digestive system: Z87.19

## 2012-05-25 HISTORY — DX: Acute myocardial infarction, unspecified: I21.9

## 2012-05-25 HISTORY — DX: Secondary malignant neoplasm of bone: C79.51

## 2012-05-25 HISTORY — DX: Anemia, unspecified: D64.9

## 2012-05-25 LAB — CBC
HCT: 33.5 % — ABNORMAL LOW (ref 39.0–52.0)
HCT: 29.3 % — ABNORMAL LOW (ref 39.0–52.0)
Hemoglobin: 10.5 g/dL — ABNORMAL LOW (ref 13.0–17.0)
Hemoglobin: 9.3 g/dL — ABNORMAL LOW (ref 13.0–17.0)
MCH: 26.1 pg (ref 26.0–34.0)
MCHC: 31.3 g/dL (ref 30.0–36.0)
MCV: 82.5 fL (ref 78.0–100.0)
RBC: 4.02 MIL/uL — ABNORMAL LOW (ref 4.22–5.81)
RDW: 16.8 % — ABNORMAL HIGH (ref 11.5–15.5)
WBC: 4.8 10*3/uL (ref 4.0–10.5)

## 2012-05-25 LAB — BASIC METABOLIC PANEL
BUN: 25 mg/dL — ABNORMAL HIGH (ref 6–23)
BUN: 25 mg/dL — ABNORMAL HIGH (ref 6–23)
CO2: 14 mEq/L — ABNORMAL LOW (ref 19–32)
Calcium: 9.1 mg/dL (ref 8.4–10.5)
Calcium: 8.5 mg/dL (ref 8.4–10.5)
Creatinine, Ser: 2.28 mg/dL — ABNORMAL HIGH (ref 0.50–1.35)
GFR calc non Af Amer: 27 mL/min — ABNORMAL LOW (ref >90)
GFR calc non Af Amer: 27 mL/min — ABNORMAL LOW (ref >90)
Glucose, Bld: 64 mg/dL — ABNORMAL LOW (ref 70–99)
Glucose, Bld: 193 mg/dL — ABNORMAL HIGH (ref 70–99)
Potassium: 5.7 mEq/L — ABNORMAL HIGH (ref 3.5–5.1)

## 2012-05-25 LAB — CARDIAC PANEL(CRET KIN+CKTOT+MB+TROPI): Troponin I: <0.3 ng/mL (ref <0.30)

## 2012-05-25 LAB — GLUCOSE, CAPILLARY: Glucose-Capillary: 158 mg/dL — ABNORMAL HIGH (ref 70–99)

## 2012-05-25 LAB — URINALYSIS, ROUTINE W REFLEX MICROSCOPIC
Glucose, UA: NEGATIVE mg/dL
Protein, ur: 100 mg/dL — AB
pH: 6 (ref 5.0–8.0)

## 2012-05-25 LAB — URINE MICROSCOPIC-ADD ON

## 2012-05-25 LAB — TROPONIN I: Troponin I: <0.3 ng/mL (ref <0.30)

## 2012-05-25 MED ORDER — SODIUM CHLORIDE 0.9 % IV SOLN
INTRAVENOUS | Status: DC
  Administered 2012-05-26 – 2012-05-27 (×3): via INTRAVENOUS

## 2012-05-25 MED ORDER — NITROGLYCERIN 0.4 MG SL SUBL: 0.4000 mg | SUBLINGUAL_TABLET | SUBLINGUAL | Status: DC | PRN

## 2012-05-25 MED ORDER — COLCHICINE 0.6 MG PO TABS: 0.6000 mg | ORAL_TABLET | Freq: Three times a day (TID) | ORAL | Status: DC

## 2012-05-25 MED ORDER — INSULIN ASPART 100 UNIT/ML ~~LOC~~ SOLN
10.0000 [IU] | Freq: Once | SUBCUTANEOUS | Status: AC
  Administered 2012-05-25: 10 [IU] via SUBCUTANEOUS

## 2012-05-25 MED ORDER — HYDRALAZINE HCL 25 MG PO TABS
25.0000 mg | ORAL_TABLET | Freq: Three times a day (TID) | ORAL | Status: DC
  Administered 2012-05-25 – 2012-05-27 (×7): 25 mg via ORAL
  Filled 2012-05-25 (×8): qty 1

## 2012-05-25 MED ORDER — SODIUM CHLORIDE 0.9 % IV SOLN: INTRAVENOUS | Status: DC

## 2012-05-25 MED ORDER — SIMVASTATIN 20 MG PO TABS
20.0000 mg | ORAL_TABLET | Freq: Every day | ORAL | Status: DC
  Administered 2012-05-25 – 2012-05-26 (×2): 20 mg via ORAL
  Filled 2012-05-25 (×3): qty 1

## 2012-05-25 MED ORDER — DEXTROSE 50 % IV SOLN
1.0000 | Freq: Once | INTRAVENOUS | Status: AC
  Administered 2012-05-25: 50 mL via INTRAVENOUS
  Filled 2012-05-25: qty 50

## 2012-05-25 MED ORDER — ENOXAPARIN SODIUM 30 MG/0.3ML ~~LOC~~ SOLN: 30.0000 mg | SUBCUTANEOUS | Status: DC

## 2012-05-25 MED ORDER — METOPROLOL TARTRATE 50 MG PO TABS
50.0000 mg | ORAL_TABLET | Freq: Two times a day (BID) | ORAL | Status: DC
  Administered 2012-05-25 – 2012-05-27 (×5): 50 mg via ORAL
  Filled 2012-05-25 (×6): qty 1

## 2012-05-25 MED ORDER — COLCHICINE 0.6 MG PO TABS
0.6000 mg | ORAL_TABLET | Freq: Every day | ORAL | Status: DC
  Administered 2012-05-25 – 2012-05-27 (×3): 0.6 mg via ORAL
  Filled 2012-05-25 (×3): qty 1

## 2012-05-25 MED ORDER — SODIUM CHLORIDE 0.9 % IV SOLN
INTRAVENOUS | Status: DC
  Administered 2012-05-25: 08:00:00 via INTRAVENOUS

## 2012-05-25 MED ORDER — ACETAMINOPHEN 650 MG RE SUPP: 650.0000 mg | Freq: Four times a day (QID) | RECTAL | Status: DC | PRN

## 2012-05-25 MED ORDER — ASPIRIN EC 81 MG PO TBEC: 81.0000 mg | DELAYED_RELEASE_TABLET | Freq: Every day | ORAL | Status: DC

## 2012-05-25 MED ORDER — ACETAMINOPHEN 325 MG PO TABS
650.0000 mg | ORAL_TABLET | Freq: Four times a day (QID) | ORAL | Status: DC | PRN
  Administered 2012-05-25 – 2012-05-26 (×2): 650 mg via ORAL
  Filled 2012-05-25 (×2): qty 2

## 2012-05-25 MED ORDER — DEXTROSE 50 % IV SOLN
INTRAVENOUS | Status: AC
  Administered 2012-05-25: 50 mL via INTRAVENOUS
  Filled 2012-05-25: qty 50

## 2012-05-25 MED ORDER — ISOSORBIDE MONONITRATE ER 60 MG PO TB24
60.0000 mg | ORAL_TABLET | Freq: Every day | ORAL | Status: DC
  Administered 2012-05-25 – 2012-05-27 (×3): 60 mg via ORAL
  Filled 2012-05-25 (×3): qty 1

## 2012-05-25 MED ORDER — ONDANSETRON HCL 4 MG/2ML IJ SOLN: 4.0000 mg | Freq: Four times a day (QID) | INTRAMUSCULAR | Status: DC | PRN

## 2012-05-25 MED ORDER — SODIUM POLYSTYRENE SULFONATE 15 GM/60ML PO SUSP
15.0000 g | Freq: Once | ORAL | Status: AC
  Administered 2012-05-25: 15 g via ORAL
  Filled 2012-05-25: qty 60

## 2012-05-25 MED ORDER — DEXTROSE 5 % IV SOLN
1.0000 g | INTRAVENOUS | Status: DC
  Administered 2012-05-25 – 2012-05-26 (×2): 1 g via INTRAVENOUS
  Filled 2012-05-25 (×3): qty 10

## 2012-05-25 MED ORDER — SODIUM BICARBONATE 8.4 % IV SOLN
50.0000 meq | Freq: Once | INTRAVENOUS | Status: AC
  Administered 2012-05-25: 50 meq via INTRAVENOUS
  Filled 2012-05-25: qty 50

## 2012-05-25 MED ORDER — SODIUM CHLORIDE 0.9 % IV BOLUS (SEPSIS)
500.0000 mL | Freq: Once | INTRAVENOUS | Status: AC
  Administered 2012-05-25: 500 mL via INTRAVENOUS

## 2012-05-25 MED ORDER — DEXTROSE 50 % IV SOLN
25.0000 g | Freq: Once | INTRAVENOUS | Status: AC
  Administered 2012-05-25: 25 g via INTRAVENOUS
  Filled 2012-05-25 (×2): qty 50

## 2012-05-25 MED ORDER — LISINOPRIL 10 MG PO TABS
10.0000 mg | ORAL_TABLET | Freq: Every day | ORAL | Status: DC
  Administered 2012-05-25: 10 mg via ORAL
  Filled 2012-05-25: qty 1

## 2012-05-25 MED ORDER — SODIUM CHLORIDE 0.9 % IV SOLN
Freq: Once | INTRAVENOUS | Status: AC
  Administered 2012-05-25: 13:00:00 via INTRAVENOUS

## 2012-05-25 MED ORDER — ONDANSETRON HCL 4 MG PO TABS: 4.0000 mg | ORAL_TABLET | Freq: Four times a day (QID) | ORAL | Status: DC | PRN

## 2012-05-25 MED ORDER — SODIUM CHLORIDE 0.9 % IV SOLN
1.0000 g | Freq: Once | INTRAVENOUS | Status: AC
  Administered 2012-05-25: 1 g via INTRAVENOUS
  Filled 2012-05-25: qty 10

## 2012-05-25 MED ORDER — CLOPIDOGREL BISULFATE 75 MG PO TABS
75.0000 mg | ORAL_TABLET | Freq: Every day | ORAL | Status: DC
  Administered 2012-05-26: 75 mg via ORAL
  Filled 2012-05-25 (×2): qty 1

## 2012-05-25 MED ORDER — SODIUM POLYSTYRENE SULFONATE 15 GM/60ML PO SUSP
30.0000 g | Freq: Once | ORAL | Status: AC
  Administered 2012-05-25: 30 g via ORAL
  Filled 2012-05-25: qty 120

## 2012-05-25 MED ORDER — ENOXAPARIN SODIUM 40 MG/0.4ML ~~LOC~~ SOLN
40.0000 mg | SUBCUTANEOUS | Status: DC
  Administered 2012-05-25 – 2012-05-26 (×2): 40 mg via SUBCUTANEOUS
  Filled 2012-05-25 (×3): qty 0.4

## 2012-05-25 MED ORDER — INSULIN ASPART 100 UNIT/ML ~~LOC~~ SOLN
0.0000 [IU] | Freq: Three times a day (TID) | SUBCUTANEOUS | Status: DC
  Administered 2012-05-26 – 2012-05-27 (×2): 3 [IU] via SUBCUTANEOUS

## 2012-05-25 NOTE — ED Notes (Signed)
Pt refusing to have EKG done at this time. Pt requesting to use bedpan, however refusing to have help getting onto the bedpan. Pt instructed on how to use bedpan, call bell within reach.

## 2012-05-25 NOTE — Progress Notes (Signed)
Rx:  Brief Lovenox note  Pt wt=92.1 kg CrCl~31 ml/min  BMI=31.8  Adjusted Lovenox to 40mg  daily for DVT prophylaxis. Did not adjust for BMI>30- CrCl is borderline and adjusted dose would be ~46 mg.  Lorenza Evangelist 05/25/2012 6:13 PM

## 2012-05-25 NOTE — Care Management Note (Unsigned)
    Page 1 of 1   05/25/2012     5:04:48 PM   CARE MANAGEMENT NOTE 05/25/2012  Patient:  [name], [name]   Account Number:  [id number]  Date Initiated:  05/25/2012  Documentation initiated by:  Lanier Clam  Subjective/Objective Assessment:   ADMITTED W/SOB.     Action/Plan:   FROM HOME ALONE   Anticipated DC Date:  05/26/2012   Anticipated DC Plan:  HOME/SELF CARE      DC Planning Services  CM consult      Choice offered to / List presented to:             Status of service:  In process, will continue to follow Medicare Important Message given?   (If response is "NO", the following Medicare IM given date fields will be blank) Date Medicare IM given:   Date Additional Medicare IM given:    Discharge Disposition:    Per UR Regulation:  Reviewed for med. necessity/level of care/duration of stay  If discussed at Long Length of Stay Meetings, dates discussed:    Comments:  05/25/12 [facility] RN,BSN NCM [phone]

## 2012-05-25 NOTE — ED Notes (Signed)
Pt's CBG was 59.  Gave pt orange juice and peanut butter and crackers.  Will re-check CBG in 30 min.

## 2012-05-25 NOTE — ED Notes (Signed)
Patient transported to X-ray 

## 2012-05-25 NOTE — ED Notes (Signed)
ZOX:[id number]<AV> Expected date:05/25/12<BR> Expected time: 5:17 AM<BR> Means of arrival:Ambulance<BR> Comments:<BR> Generalized weakness

## 2012-05-25 NOTE — ED Notes (Signed)
Per EMS:  Pt complaining of generalized weakness.  Pt st's he had a fall and didn't complain of any pain from that.  Pt drove himself to the fire dpt and they called EMS.  Pt's only complaint is weakness, no other complaints, pt in nad.

## 2012-05-25 NOTE — ED Provider Notes (Addendum)
History     CSN: [id number]  Arrival date & time 05/25/12  0540   First MD Initiated Contact with Patient 05/25/12 [phone]      Chief Complaint  Patient presents with  . Weakness    (Consider location/radiation/quality/duration/timing/severity/associated sxs/prior treatment) Patient is a 77 y.o. male presenting with weakness. The history is provided by the patient.  Weakness Primary symptoms do not include fever or vomiting.  Additional symptoms include weakness.  pt c/o generalized weakness this morning. States awoke and felt weak, slightly clammy. Was lightheaded when stood, no syncope Says felt bs was low. Drove self to fire dept where ems was called. Initial bs 59. Pt notes felt similarly early yesterday morning at which time bs was 60. Pt says ate relatively little yesterday, esp in evening. Denies recent change in diabetes meds/doses. No nvd. Normal appetite. Does note recent non productive cough. No sore throat, runny nose, or other uri c/o. Denies cp or sob. No abd pain. No gu c/o, states making normal amount urine. Pt denies fever or chills. States was given crackers this morning, bs improved, says starting to feel better. Pt denies focal numbness or weakness. No change in speech or vision.   Past Medical History  Diagnosis Date  . CAD (coronary artery disease)   . Diabetes mellitus   . Hypertension   . Renal insufficiency   . Obesity   . Dyslipidemia     History reviewed. No pertinent past surgical history.  Family History  Problem Relation Age of Onset  . Diabetes Mother     History  Substance Use Topics  . Smoking status: Former Games developer  . Smokeless tobacco: Not on file  . Alcohol Use: No     former heavy use      Review of Systems  Constitutional: Negative for fever and chills.  HENT: Negative for sore throat and neck pain.   Eyes: Negative for visual disturbance.  Respiratory: Positive for cough. Negative for shortness of breath.   Cardiovascular:  Negative for chest pain.  Gastrointestinal: Negative for vomiting, abdominal pain and diarrhea.  Genitourinary: Negative for flank pain.  Musculoskeletal: Negative for back pain.  Skin: Negative for rash.  Neurological: Positive for weakness. Negative for syncope and numbness.  Hematological: Does not bruise/bleed easily.  Psychiatric/Behavioral: Negative for confusion.    Allergies  Aspirin  Home Medications   Current Outpatient Rx  Name Route Sig Dispense Refill  . COLCHICINE 0.6 MG PO TABS Oral Take 0.6 mg by mouth every 8 (eight) hours.      Marland Kitchen HYDRALAZINE HCL 25 MG PO TABS Oral Take 25 mg by mouth 3 (three) times daily.      . ISOSORBIDE MONONITRATE 60 MG PO TB24 Oral Take 60 mg by mouth daily.      Marland Kitchen LISINOPRIL 10 MG PO TABS Oral Take 10 mg by mouth daily.      Marland Kitchen METOPROLOL TARTRATE 50 MG PO TABS Oral Take 50 mg by mouth 2 (two) times daily.      Marland Kitchen NITROGLYCERIN 0.4 MG SL SUBL Sublingual Place 0.4 mg under the tongue every 5 (five) minutes as needed.      Marland Kitchen SIMVASTATIN 20 MG PO TABS Oral Take 20 mg by mouth at bedtime.      Marland Kitchen SITAGLIPTIN PHOSPHATE 50 MG PO TABS Oral Take 50 mg by mouth daily.        BP 157/90  Pulse 76  Resp 18  SpO2 99%  Physical Exam  Nursing note  and vitals reviewed. Constitutional: He is oriented to person, place, and time. He appears well-developed and well-nourished. No distress.  HENT:  Head: Atraumatic.  Nose: Nose normal.  Mouth/Throat: Oropharynx is clear and moist.  Eyes: Conjunctivae are normal. Pupils are equal, round, and reactive to light. No scleral icterus.  Neck: Neck supple. No tracheal deviation present.  Cardiovascular: Normal rate, regular rhythm, normal heart sounds and intact distal pulses.   Pulmonary/Chest: Effort normal and breath sounds normal. No accessory muscle usage. No respiratory distress.  Abdominal: Soft. Bowel sounds are normal. He exhibits no distension and no mass. There is no tenderness. There is no rebound and  no guarding.  Genitourinary:       No cva tenderness  Musculoskeletal: Normal range of motion. He exhibits no edema and no tenderness.  Neurological: He is alert and oriented to person, place, and time.       Motor intact bil.   Skin: Skin is warm and dry. He is not diaphoretic.  Psychiatric: He has a normal mood and affect.    ED Course  Procedures (including critical care time)  Labs Reviewed  GLUCOSE, CAPILLARY - Abnormal; Notable for the following:    Glucose-Capillary 59 (*)     All other components within normal limits  CBC - Abnormal; Notable for the following:    WBC 3.8 (*)     RBC 4.02 (*)     Hemoglobin 10.5 (*)     HCT 33.5 (*)     RDW 17.0 (*)     All other components within normal limits  GLUCOSE, CAPILLARY - Abnormal; Notable for the following:    Glucose-Capillary 100 (*)     All other components within normal limits  BASIC METABOLIC PANEL  URINALYSIS, ROUTINE W REFLEX MICROSCOPIC  TROPONIN I   Dg Chest 2 View  05/24/2012  *RADIOLOGY REPORT*  Clinical Data: Shortness of breath.  CHEST - 2 VIEW  Comparison: 05/17/2011  Findings: Normal heart size and pulmonary vascularity.  Calcified and tortuous aorta.  Fine interstitial changes suggesting chronic bronchitis.  No focal airspace consolidation in the lungs.  No blunting of costophrenic angles.  No pneumothorax.  No significant change since previous study.  IMPRESSION: Chronic bronchitic changes.  No evidence of active pulmonary disease.  Original Report Authenticated By: Marlon Pel, M.D.    Results for orders placed during the hospital encounter of 05/25/12  GLUCOSE, CAPILLARY      Component Value Range   Glucose-Capillary 59 (*) 70 - 99 mg/dL  CBC      Component Value Range   WBC 3.8 (*) 4.0 - 10.5 K/uL   RBC 4.02 (*) 4.22 - 5.81 MIL/uL   Hemoglobin 10.5 (*) 13.0 - 17.0 g/dL   HCT 96.0 (*) 45.4 - 09.8 %   MCV 83.3  78.0 - 100.0 fL   MCH 26.1  26.0 - 34.0 pg   MCHC 31.3  30.0 - 36.0 g/dL   RDW  11.9 (*) 14.7 - 15.5 %   Platelets 259  150 - 400 K/uL  BASIC METABOLIC PANEL      Component Value Range   Sodium 133 (*) 135 - 145 mEq/L   Potassium 5.7 (*) 3.5 - 5.1 mEq/L   Chloride 111  96 - 112 mEq/L   CO2 14 (*) 19 - 32 mEq/L   Glucose, Bld 64 (*) 70 - 99 mg/dL   BUN 25 (*) 6 - 23 mg/dL   Creatinine, Ser 8.29 (*) 0.50 - 1.35  mg/dL   Calcium 9.1  8.4 - 16.1 mg/dL   GFR calc non Af Amer 27 (*) >90 mL/min   GFR calc Af Amer 32 (*) >90 mL/min  GLUCOSE, CAPILLARY      Component Value Range   Glucose-Capillary 100 (*) 70 - 99 mg/dL  TROPONIN I      Component Value Range   Troponin I <0.30  <0.30 ng/mL   Dg Chest 2 View  05/24/2012  *RADIOLOGY REPORT*  Clinical Data: Shortness of breath.  CHEST - 2 VIEW  Comparison: 05/17/2011  Findings: Normal heart size and pulmonary vascularity.  Calcified and tortuous aorta.  Fine interstitial changes suggesting chronic bronchitis.  No focal airspace consolidation in the lungs.  No blunting of costophrenic angles.  No pneumothorax.  No significant change since previous study.  IMPRESSION: Chronic bronchitic changes.  No evidence of active pulmonary disease.  Original Report Authenticated By: Marlon Pel, M.D.       MDM  Iv ns. Labs.   Reviewed nursing notes and prior charts for additional history.   Glucose 60, k elev (although less so than yesterday). d50 iv, nahco3 iv, iv ns, kalexalate po.   Given 2 consecutive days, ed visits, recurrent hypoglycemia on oral meds, weakness, elevated potassium, will admit to med service.    Date: 05/25/2012  Rate: 84  Rhythm: normal sinus rhythm  QRS Axis: normal  Intervals: normal  ST/T Wave abnormalities: nonspecific ST/T changes  Conduction Disutrbances:left bundle branch block  Narrative Interpretation:   Old EKG Reviewed: unchanged  Discussed w Dr Starr Sinclair - states obs status admit to team 5 tele temp orders   Suzi Roots, MD 05/25/12 0960  Suzi Roots, MD 05/25/12 4540

## 2012-05-25 NOTE — ED Notes (Signed)
Pt given orange juice and peanut butter crackers.  

## 2012-05-25 NOTE — H&P (Signed)
[facility] History and Physical  [name] UJW:[id number] DOB: October 11, 1935 DOA: 05/25/2012  Referring physician:  PCP: Leo Grosser, MD   Chief Complaint: Generalized weakness, low blood sugar  HPI:  The patient is a 77 year old male with past medical history significant for diabetes mellitus, hypertension and chronic kidney disease who presents with above complaint complaints. He states that this morning when he woke up he felt very weak and a dizzy. He drove himself to the fire station and when his blood sugar was checked it was 59. he denies syncope. He states that he had a similar episode in on the day prior to this admission and when he went to the fire station his blood glucose was 60 and he was also brought to the ED and he was treated in the ED and discharged home. She denies diarrhea, nausea vomiting, dysuria, cough and no fevers. He states he was started on new diabetes medications are 2-3 weeks ago, and that this seems to happen whenever his medications are changed. He was seen in the ED and labs revealed a potassium of 5.7 with a creatinine of 2.2 (his last creatinine in 5/7/ 2013 was 3.07). Urinalysis was done in the ED and was consistent with a UTI. The patient was treated with D50, Kayexalate,insulin in the ED and was admitted for further evaluation and management. He admits to vague chest pain stating that has been intermittent -tightness,all across his chest with some associated shortness of breath, denies associated nausea or vomiting as above.  Review of Systems:  The patient denies anorexia, fever, weight loss,, vision loss, decreased hearing, hoarseness, syncope, dyspnea on exertion, peripheral edema, balance deficits, hemoptysis, abdominal pain, melena, hematochezia, severe indigestion/heartburn, hematuria, incontinence, muscle weakness, transient blindness, depression, unusual weight change, abnormal bleeding, .   Past Medical History  Diagnosis Date  . CAD  (coronary artery disease)   . Diabetes mellitus   . Hypertension   . Renal insufficiency   . Obesity   . Dyslipidemia   . Myocardial infarction 2007  . H/O allergic rhinitis   . Cancer     metastatic prostate cancer  . Anemia   . H/O: gout   . H/O hemorrhoids   . Pneumonia   . Renal insufficiency   . H/O: GI bleed   . Bone metastasis    Past Surgical History  Procedure Date  . Transurethral resection of prostate 06/08/2011, 11/08/06  . Right hand surgery 1974   Social History:  reports that he has quit smoking. He has never used smokeless tobacco. He reports that he does not drink alcohol or use illicit drugs.  Allergies  Allergen Reactions  . Aspirin     Family History  Problem Relation Age of Onset  . Diabetes Mother     Prior to Admission medications   Medication Sig Start Date End Date Taking? Authorizing Provider  colchicine 0.6 MG tablet Take 0.6 mg by mouth every 8 (eight) hours.      Historical Provider, MD  hydrALAZINE (APRESOLINE) 25 MG tablet Take 25 mg by mouth 3 (three) times daily.      Historical Provider, MD  isosorbide mononitrate (IMDUR) 60 MG 24 hr tablet Take 60 mg by mouth daily.      Historical Provider, MD  lisinopril (PRINIVIL,ZESTRIL) 10 MG tablet Take 10 mg by mouth daily.      Historical Provider, MD  metoprolol (LOPRESSOR) 50 MG tablet Take 50 mg by mouth 2 (two) times daily.  Historical Provider, MD  nitroGLYCERIN (NITROSTAT) 0.4 MG SL tablet Place 0.4 mg under the tongue every 5 (five) minutes as needed.      Historical Provider, MD  simvastatin (ZOCOR) 20 MG tablet Take 20 mg by mouth at bedtime.      Historical Provider, MD  sitaGLIPtan (JANUVIA) 50 MG tablet Take 50 mg by mouth daily.      Historical Provider, MD   Physical Exam: Filed Vitals:   05/25/12 0830 05/25/12 0900 05/25/12 1012 05/25/12 1500  BP: 162/89 178/92 182/100 184/101  Pulse: 76 85 86 80  Temp:   97.8 F (36.6 C) 97.7 F (36.5 C)  TempSrc:   Oral Oral  Resp:  22 19 20 20   Height:   5\' 7"  (1.702 m)   Weight:   203 kg (447 lb 8.5 oz)   SpO2: 99% 100% 100% 99%    Constitutional: Vital signs reviewed.  Patient is a well-developed and well-nourished  in no acute distress and cooperative with exam. Alert and oriented x3.  Head: Normocephalic and atraumatic Mouth: no erythema or exudates, slightly dry MM Eyes: PERRL, EOMI, conjunctivae normal, No scleral icterus.  Neck: Supple, Trachea midline normal ROM, No JVD, mass, thyromegaly, or carotid bruit present.  Cardiovascular: RRR, S1 normal, S2 normal, no MRG, pulses symmetric and intact bilaterally Pulmonary/Chest: CTAB, no wheezes, rales, or rhonchi Abdominal: Soft. Non-tender, non-distended, bowel sounds are normal, no masses, organomegaly, or guarding present.  GU: no CVA tenderness Extremities: no cyanosis and no edema   Neurological: A&O x3, Strenght is normal and symmetric bilaterally, cranial nerve II-XII are grossly intact, no focal motor deficit, sensory intact to light touch bilaterally.  Skin: Warm, dry and intact. No rash.  Psychiatric: Normal mood and affect.  Labs on Admission:  Basic Metabolic Panel:  Lab 05/25/12 1610 05/24/12 0500  NA 133* 139  K 5.7* 6.2*  CL 111 116*  CO2 14* --  GLUCOSE 64* 108*  BUN 25* 29*  CREATININE 2.22* 2.20*  CALCIUM 9.1 --  MG -- --  PHOS -- --   Liver Function Tests: No results found for this basename: AST:5,ALT:5,ALKPHOS:5,BILITOT:5,PROT:5,ALBUMIN:5 in the last 168 hours No results found for this basename: LIPASE:5,AMYLASE:5 in the last 168 hours No results found for this basename: AMMONIA:5 in the last 168 hours CBC:  Lab 05/25/12 0555 05/24/12 0545 05/24/12 0500  WBC 3.8* 4.6 --  NEUTROABS -- 3.1 --  HGB 10.5* 10.3* 11.6*  HCT 33.5* 32.8* 34.0*  MCV 83.3 83.7 --  PLT 259 277 --   Cardiac Enzymes:  Lab 05/25/12 0555 05/24/12 0545  CKTOTAL -- --  CKMB -- --  CKMBINDEX -- --  TROPONINI <0.30 <0.30   BNP: No components found  with this basename: POCBNP:5 CBG:  Lab 05/25/12 0954 05/25/12 0802 05/25/12 0618 05/25/12 0546 05/24/12 0347  GLUCAP 192* 181* 100* 59* 92    Radiological Exams on Admission: Dg Chest 2 View  05/25/2012  *RADIOLOGY REPORT*  Clinical Data: Cough and congestion.  Diabetic and hypertension  CHEST - 2 VIEW  Comparison: 05/24/2012  Findings: Cardiac enlargement.   Pulmonary vascular congestion has improved in the interval.  There is no edema or effusion.  Negative for pneumonia.  Mild hyperinflation suggestive of chronic lung disease.  IMPRESSION: Improvement in pulmonary vascular congestion.  Negative for heart failure or pneumonia.  Probable chronic lung disease.  Original Report Authenticated By: Camelia Phenes, M.D.   Dg Chest 2 View  05/24/2012  *RADIOLOGY REPORT*  Clinical Data:  Shortness of breath.  CHEST - 2 VIEW  Comparison: 05/17/2011  Findings: Normal heart size and pulmonary vascularity.  Calcified and tortuous aorta.  Fine interstitial changes suggesting chronic bronchitis.  No focal airspace consolidation in the lungs.  No blunting of costophrenic angles.  No pneumothorax.  No significant change since previous study.  IMPRESSION: Chronic bronchitic changes.  No evidence of active pulmonary disease.  Original Report Authenticated By: Marlon Pel, M.D.    EKG: Normal sinus rhythm, left bundle branch also present on previous EKG of 6/27.  Assessment/Plan Active Problems:  Hypoglycemia -As discussed above, will hold Januvia, monitor Accu-Cheks and cover with sliding scale as appropriate. -Urinary tract infection possibly contributing-start empiric antibiotics and follow. Hyperkalemia -Status post Kayexalate D50 and insulin in the ED, follow and recheck and further treat as appropriate -Hold ACE inhibitor, follow  Hyponatremia -Hydrate follow and recheck.  UTI (lower urinary tract infection) -Obtain urine cultures, empiric antibiotics and follow  H/O: gout -Continue  colchicine at decreased dose given his renal function and follow.  Essential hypertension, benign -Resume outpatient medications except for the ACE inhibitor, monitor and further treat accordingly.  Chronic renal insufficiency -As above his last creatinine in May was 3.07- and it is 2.2 today, follow.  history of coronary artery disease/chest discomfort -Cycle cardiac enzymes, is allergic to aspirin we'll place on Plavix and now when necessary nitrates and follow. -Continue metoprolol Code Status: full  Kela Millin, MD  [facility] Pager [phone]  If 7PM-7AM, please contact night-coverage [email or web address] Password [facility] 05/25/2012, 6:04 PM

## 2012-05-26 DIAGNOSIS — N390 Urinary tract infection, site not specified: Secondary | ICD-10-CM

## 2012-05-26 DIAGNOSIS — E875 Hyperkalemia: Secondary | ICD-10-CM

## 2012-05-26 DIAGNOSIS — I10 Essential (primary) hypertension: Secondary | ICD-10-CM

## 2012-05-26 DIAGNOSIS — E1169 Type 2 diabetes mellitus with other specified complication: Secondary | ICD-10-CM

## 2012-05-26 LAB — GLUCOSE, CAPILLARY
Glucose-Capillary: 95 mg/dL (ref 70–99)
Glucose-Capillary: 212 mg/dL — ABNORMAL HIGH (ref 70–99)

## 2012-05-26 LAB — BASIC METABOLIC PANEL
BUN: 24 mg/dL — ABNORMAL HIGH (ref 6–23)
CO2: 16 mEq/L — ABNORMAL LOW (ref 19–32)
CO2: 19 mEq/L (ref 19–32)
Calcium: 9 mg/dL (ref 8.4–10.5)
Calcium: 8.8 mg/dL (ref 8.4–10.5)
Chloride: 113 mEq/L — ABNORMAL HIGH (ref 96–112)
Creatinine, Ser: 2.23 mg/dL — ABNORMAL HIGH (ref 0.50–1.35)
GFR calc Af Amer: 35 mL/min — ABNORMAL LOW (ref >90)
Glucose, Bld: 30 mg/dL — CL (ref 70–99)
Sodium: 138 mEq/L (ref 135–145)

## 2012-05-26 LAB — CARDIAC PANEL(CRET KIN+CKTOT+MB+TROPI)
CK, MB: 3.5 ng/mL (ref 0.3–4.0)
CK, MB: 4 ng/mL (ref 0.3–4.0)
Total CK: 87 U/L (ref 7–232)
Troponin I: <0.3 ng/mL (ref <0.30)
Troponin I: <0.3 ng/mL (ref <0.30)

## 2012-05-26 LAB — HEMOGLOBIN A1C: Mean Plasma Glucose: 189 mg/dL — ABNORMAL HIGH (ref <117)

## 2012-05-26 MED ORDER — DEXTROSE 50 % IV SOLN
25.0000 mL | Freq: Once | INTRAVENOUS | Status: AC | PRN
  Administered 2012-05-26: 25 mL via INTRAVENOUS

## 2012-05-26 MED ORDER — GLUCOSE 40 % PO GEL
1.0000 | ORAL | Status: DC | PRN
  Administered 2012-05-26: 37.5 g via ORAL

## 2012-05-26 MED ORDER — GLUCOSE 40 % PO GEL
ORAL | Status: AC
  Administered 2012-05-26: 37.5 g via ORAL
  Filled 2012-05-26: qty 1

## 2012-05-26 NOTE — Progress Notes (Signed)
CBG: 43 @0110   Treatment: 15 GM carbohydrate snack  Symptoms: Sweaty and Shaky  Follow-up CBG: Time:0150 CBG Result:48  Possible Reasons for Event: Medication regimen: Amp d50 and 10 units novolog given to bring K down  Comments/MD notified:gave graham cracker, peanut butter and OJ. Took a while for pt to eat the snack explaining why repeat CBG was longer than 15 mins.    Tinika Bucknam, [place]

## 2012-05-26 NOTE — Progress Notes (Signed)
  PT Cancellation Note/DC services per pt request  Evaluation cancelled today due to pt refusing therapy during this visit.  Explained our reasoning and need for assessment at this time and he is well aware of our services and politely refusing.  He states he can move himself and has been getting up to the bathroom.  "I don't want to and I don't want to talk right now , I can do it myself".  Unsure of pt's ability and his discharge environment.    We will sign off at this time due to pt's refusal, if MD would like for [place] to assess and pt agreeable PLEASE RE-ORDER we would be happy to evaluate and treat.     Thank you,    Marella Bile 05/26/2012, 9:14 AM

## 2012-05-26 NOTE — Progress Notes (Signed)
Subjective: Patient states that he lives alone and many times he is only able to eat a sandwich a day, misses meals. He states he does not want to go to a nursing facility. Complaining of abdominal discomfort but states better today Objective: Vital signs in last 24 hours: Temp:  [97.4 F (36.3 C)-98.3 F (36.8 C)] 97.4 F (36.3 C) (06/29 0611) Pulse Rate:  [69-80] 80  (06/29 0611) Resp:  [20] 20  (06/29 0611) BP: (134-184)/(70-101) 179/99 mmHg (06/29 0611) SpO2:  [97 %-99 %] 98 % (06/29 0611) Weight:  [92.08 kg (203 lb)] 92.08 kg (203 lb) (06/28 1801) Last BM Date: 05/25/12 Intake/Output from previous day: 06/28 0701 - 06/29 0700 In: 1966.3 [P.O.:960; I.V.:956.3; IV Piggyback:50] Out: 500 [Urine:500] Intake/Output this shift:      General Appearance:    Alert, cooperative, no distress, appears stated age  Lungs:     Clear to auscultation bilaterally, respirations unlabored   Heart:    Regular rate and rhythm, S1 and S2 normal, no murmur, rub   or gallop  Abdomen:     Soft, mild enderness, bowel sounds active all four quadrants,    no masses, no organomegaly  Extremities:   Extremities normal, atraumatic, no cyanosis or edema  Neurologic:   CNII-XII intact, normal strength, sensation and reflexes    throughout    Weight change:   Intake/Output Summary (Last 24 hours) at 05/26/12 1332 Last data filed at 05/26/12 0700  Gross per 24 hour  Intake 1966.25 ml  Output    500 ml  Net 1466.25 ml    Lab Results:   Basename 05/26/12 0509 05/26/12 0103  NA 138 138  K 5.2* 4.8  CL 113* 115*  CO2 19 16*  GLUCOSE 58* 30*  BUN 23 24*  CREATININE 2.06* 2.23*  CALCIUM 8.8 9.0    Basename 05/25/12 1849 05/25/12 0555  WBC 4.8 3.8*  HGB 9.3* 10.5*  HCT 29.3* 33.5*  PLT 234 259  MCV 82.5 83.3   PT/INR No results found for this basename: LABPROT:2,INR:2 in the last 72 hours ABG No results found for this basename: PHART:2,PCO2:2,PO2:2,HCO3:2 in the last 72 hours  Micro  Results: No results found for this or any previous visit (from the past 240 hour(s)). Studies/Results: Dg Chest 2 View  05/25/2012  *RADIOLOGY REPORT*  Clinical Data: Cough and congestion.  Diabetic and hypertension  CHEST - 2 VIEW  Comparison: 05/24/2012  Findings: Cardiac enlargement.   Pulmonary vascular congestion has improved in the interval.  There is no edema or effusion.  Negative for pneumonia.  Mild hyperinflation suggestive of chronic lung disease.  IMPRESSION: Improvement in pulmonary vascular congestion.  Negative for heart failure or pneumonia.  Probable chronic lung disease.  Original Report Authenticated By: Camelia Phenes, M.D.   Medications: Scheduled Meds:   . sodium chloride   Intravenous Once  . calcium gluconate  1 g Intravenous Once  . cefTRIAXone (ROCEPHIN)  IV  1 g Intravenous Q24H  . clopidogrel  75 mg Oral Q breakfast  . colchicine  0.6 mg Oral Daily  . dextrose  1 ampule Intravenous Once  . enoxaparin (LOVENOX) injection  40 mg Subcutaneous Q24H  . hydrALAZINE  25 mg Oral TID  . insulin aspart  0-9 Units Subcutaneous TID WC  . insulin aspart  10 Units Subcutaneous Once  . isosorbide mononitrate  60 mg Oral Daily  . metoprolol  50 mg Oral BID  . simvastatin  20 mg Oral QHS  .  sodium polystyrene  30 g Oral Once  . DISCONTD: aspirin EC  81 mg Oral Daily  . DISCONTD: enoxaparin  30 mg Subcutaneous Q24H  . DISCONTD: lisinopril  10 mg Oral Daily   Continuous Infusions:   . sodium chloride 75 mL/hr at 05/26/12 0650   PRN Meds:.acetaminophen, acetaminophen, dextrose, dextrose, nitroGLYCERIN, ondansetron (ZOFRAN) IV, ondansetron Assessment/Plan: Active Problems:  Hypoglycemia  - holding Januvia, monitor Accu-Cheks and cover with sliding scale as appropriate.  -Urinary tract infection possibly contributing-continue antibiotics -one borderline low blood pressure today, monitor and if not further will start on lower dose meds and plan DC -Case manager consult for  possible assistance with meals as this is a contributing factor to his hypoglycemic episodes at home Hyperkalemia  -Holding ACE inhibitor, follow -Much improved status post Kayexalate D50 and insulin last pM  Hyponatremia  -Hydrate follow and recheck.  UTI (lower urinary tract infection)  -follow urine cultures, continue empiric antibiotics  H/O: gout  -Continue colchicine at decreased dose given his renal function and follow.  Essential hypertension, benign  -Continue current meds Chronic renal insufficiency  -improving last creatinine in May was 3.07- and it is 2.2 today, follow.  history of coronary artery disease/chest discomfort  -Cardiac enzymes negative, patient chest pain-free, will DC Plavix.-Continue metoprolol   LOS: 1 day   Shavelle Runkel C 05/26/2012, 1:32 PM

## 2012-05-26 NOTE — Progress Notes (Signed)
CBG: 48 @0150   Treatment: 1 tube instant glucose  Symptoms: Sweaty and Shaky  Follow-up CBG: Time:0214 CBG Result:55  Possible Reasons for Event: Medication regimen:   Comments/MD notified: Called Lenny Pastel regarding critical glucose on  BMET. Made him aware of the hypoglycemic interventions I had completed. He mentioned to give D50 if other interventions weren't effective. Told him I would see how the glucose tube I gave worked in 15 mins before giving the D50.    Kassius Battiste, [place]

## 2012-05-26 NOTE — Progress Notes (Signed)
CBG: 55 @0214   Treatment: D50 IV 25 mL  Symptoms: Sweaty and Shaky  Follow-up CBG: Time:0241 CBG Result:95  Possible Reasons for Event: Medication regimen:   Comments/MD notified:    Graclyn Lawther, [place]

## 2012-05-26 NOTE — Progress Notes (Signed)
CRITICAL VALUE ALERT  Critical value received:  Glucose-30  Date of notification:  05/26/2012  Time of notification:  0200  Critical value read back:yes  Nurse who received alert:  Chyla Schlender, [place] RN  MD notified (1st page):  Lenny Pastel, NP  Time of first page:  0215  MD notified (2nd page):  Time of second page:  Responding MD:  Lenny Pastel, NP  Time MD responded:  [phone]  Also made him aware of pt's repeat K level after intervention. Uliana Brinker, [place] RN

## 2012-05-26 NOTE — Progress Notes (Signed)
OT Cancellation Note  Treatment cancelled today due to patient's refusal to participate - citing fatigue and not feeling well.  He was receptive to OT returning at a later date to attempt eval. Will re-attempt.  Jeani Hawking M [phone] 05/26/2012, 4:03 PM

## 2012-05-26 NOTE — Progress Notes (Addendum)
CRITICAL VALUE ALERT  Critical value received:  K- 6.4  Date of notification:  05/25/12  Time of notification:  2130  Critical value read back:yes  Nurse who received alert:  Will Schier, [place] RN  MD notified (1st page):  Lenny Pastel, NP  Time of first page:  2135  MD notified (2nd page):  Time of second page:  Responding MD:  Lenny Pastel, NP  Time MD responded:  [phone]

## 2012-05-27 DIAGNOSIS — I10 Essential (primary) hypertension: Secondary | ICD-10-CM

## 2012-05-27 DIAGNOSIS — N390 Urinary tract infection, site not specified: Secondary | ICD-10-CM

## 2012-05-27 DIAGNOSIS — E1169 Type 2 diabetes mellitus with other specified complication: Secondary | ICD-10-CM

## 2012-05-27 DIAGNOSIS — E875 Hyperkalemia: Secondary | ICD-10-CM

## 2012-05-27 LAB — BASIC METABOLIC PANEL
BUN: 22 mg/dL (ref 6–23)
CO2: 16 mEq/L — ABNORMAL LOW (ref 19–32)
Chloride: 115 mEq/L — ABNORMAL HIGH (ref 96–112)
Creatinine, Ser: 1.99 mg/dL — ABNORMAL HIGH (ref 0.50–1.35)
Glucose, Bld: 125 mg/dL — ABNORMAL HIGH (ref 70–99)

## 2012-05-27 LAB — URINE CULTURE: Culture: NO GROWTH

## 2012-05-27 LAB — GLUCOSE, CAPILLARY
Glucose-Capillary: 120 mg/dL — ABNORMAL HIGH (ref 70–99)
Glucose-Capillary: 219 mg/dL — ABNORMAL HIGH (ref 70–99)
Glucose-Capillary: 141 mg/dL — ABNORMAL HIGH (ref 70–99)

## 2012-05-27 MED ORDER — SITAGLIPTIN PHOSPHATE 25 MG PO TABS: 25.0000 mg | ORAL_TABLET | Freq: Every day | ORAL | Status: DC

## 2012-05-27 MED ORDER — COLCHICINE 0.6 MG PO TABS: 0.6000 mg | ORAL_TABLET | Freq: Every day | ORAL | Status: DC

## 2012-05-27 MED ORDER — SITAGLIPTIN PHOSPHATE 25 MG PO TABS
25.0000 mg | ORAL_TABLET | ORAL | Status: AC
  Administered 2012-05-27: 25 mg via ORAL
  Filled 2012-05-27: qty 1

## 2012-05-27 MED ORDER — NON FORMULARY: 25.0000 mg | Freq: Every day | Status: DC

## 2012-05-27 MED ORDER — CEFUROXIME AXETIL 500 MG PO TABS: 500.0000 mg | ORAL_TABLET | Freq: Two times a day (BID) | ORAL | Status: AC

## 2012-05-27 NOTE — Discharge Summary (Signed)
Discharge Note  Name: [name] MRN: [id number] DOB: 11-30-1934 77 y.o.  Date of Admission: 05/25/2012  5:42 AM Date of Discharge: 05/27/2012 Attending Physician: Kela Millin, MD  Discharge Diagnosis: Active Problems:  Hypoglycemia  Hyperkalemia  Hyponatremia  UTI (lower urinary tract infection)  H/O: gout  Essential hypertension, benign  Chronic renal insufficiency   Discharge Medications: Medication List  As of 05/27/2012  2:43 PM   STOP taking these medications         lisinopril 10 MG tablet      pioglitazone 30 MG tablet         TAKE these medications         allopurinol 100 MG tablet   Commonly known as: ZYLOPRIM   Take 200 mg by mouth daily.      cefUROXime 500 MG tablet   Commonly known as: CEFTIN   Take 1 tablet (500 mg total) by mouth 2 (two) times daily.      colchicine 0.6 MG tablet   Take 1 tablet (0.6 mg total) by mouth daily.      furosemide 20 MG tablet   Commonly known as: LASIX   Take 20 mg by mouth daily.      hydrALAZINE 25 MG tablet   Commonly known as: APRESOLINE   Take 25 mg by mouth 3 (three) times daily.      isosorbide mononitrate 60 MG 24 hr tablet   Commonly known as: IMDUR   Take 60 mg by mouth daily.      metoprolol 50 MG tablet   Commonly known as: LOPRESSOR   Take 50 mg by mouth 2 (two) times daily.      nitroGLYCERIN 0.4 MG SL tablet   Commonly known as: NITROSTAT   Place 0.4 mg under the tongue every 5 (five) minutes as needed.      simvastatin 20 MG tablet   Commonly known as: ZOCOR   Take 20 mg by mouth at bedtime.      sitaGLIPtin 25 MG tablet   Commonly known as: JANUVIA   Take 1 tablet (25 mg total) by mouth daily with breakfast.      Tamsulosin HCl 0.4 MG Caps   Commonly known as: FLOMAX   Take 0.4 mg by mouth daily.      Vitamin D (Ergocalciferol) 50000 UNITS Caps   Commonly known as: DRISDOL   Take 50,000 Units by mouth 2 (two) times a week.            Disposition and follow-up:     [name] was discharged from [facility] in improved/stable condition.    Follow-up Appointments: Discharge Orders    Future Orders Please Complete By Expires   Diet Carb Modified      Increase activity slowly         Consultations:    Procedures Performed:  Dg Chest 2 View  05/25/2012  *RADIOLOGY REPORT*  Clinical Data: Cough and congestion.  Diabetic and hypertension  CHEST - 2 VIEW  Comparison: 05/24/2012  Findings: Cardiac enlargement.   Pulmonary vascular congestion has improved in the interval.  There is no edema or effusion.  Negative for pneumonia.  Mild hyperinflation suggestive of chronic lung disease.  IMPRESSION: Improvement in pulmonary vascular congestion.  Negative for heart failure or pneumonia.  Probable chronic lung disease.  Original Report Authenticated By: Camelia Phenes, M.D.   Dg Chest 2 View  05/24/2012  *RADIOLOGY REPORT*  Clinical Data: Shortness of breath.  CHEST - 2 VIEW  Comparison: 05/17/2011  Findings: Normal heart size and pulmonary vascularity.  Calcified and tortuous aorta.  Fine interstitial changes suggesting chronic bronchitis.  No focal airspace consolidation in the lungs.  No blunting of costophrenic angles.  No pneumothorax.  No significant change since previous study.  IMPRESSION: Chronic bronchitic changes.  No evidence of active pulmonary disease.  Original Report Authenticated By: Marlon Pel, M.D.    Admission HPI The patient is a 77 year old male with past medical history significant for diabetes mellitus, hypertension and chronic kidney disease who presents with above complaint complaints. He states that this morning when he woke up he felt very weak and a dizzy. He drove himself to the fire station and when his blood sugar was checked it was 59. he denies syncope. He states that he had a similar episode in on the day prior to this admission and when he went to the fire station his blood glucose was 60 and he was  also brought to the ED and he was treated in the ED and discharged home. She denies diarrhea, nausea vomiting, dysuria, cough and no fevers. He states he was started on new diabetes medications are 2-3 weeks ago, and that this seems to happen whenever his medications are changed. He was seen in the ED and labs revealed a potassium of 5.7 with a creatinine of 2.2 (his last creatinine in 5/7/ 2013 was 3.07). Urinalysis was done in the ED and was consistent with a UTI. The patient was treated with D50, Kayexalate,insulin in the ED and was admitted for further evaluation and management. He admits to vague chest pain stating that has been intermittent -tightness,all across his chest with some associated shortness of breath, denies associated nausea or vomiting as above.  EXAM General Appearance:  Alert, cooperative, no distress, appears stated age   Lungs:  Clear to auscultation bilaterally, respirations unlabored   Heart:  Regular rate and rhythm, S1 and S2 normal, no murmur, rub  or gallop   Abdomen:  Soft, mild enderness, bowel sounds active all four quadrants,  no masses, no organomegaly   Extremities:  Extremities normal, atraumatic, no cyanosis or edema   Neurologic:  CNII-XII intact, normal strength, sensation and reflexes  throughout      Hospital Course by problem list: Active Problems:  Hypoglycemia  Hyperkalemia  Hyponatremia  UTI (lower urinary tract infection)  H/O: gout  Essential hypertension, benign  Chronic renal insufficiency  Active Problems:  Hypoglycemia  As discussed above Upon admission the patient's Januvia and Actos were held. His Accu-Cheks were monitored and he he was covered with sliding scale insulin. Workup for precipitating factors included cardiac enzymes which came back negative his urinalysis was consistent with a urinary tract infection and he was treated with empiric antibiotics for this. Also on in talking with the patient he did reveal that he lives alone  and does not always get all his meals sometimes a sandwich a day -and this would be a factor in his hypoglycemic episodes at home. His blood sugars were further monitored and they have ranged from 95-219 today. His not had any further hypoglycemic episodes. His and Januvia was started at half the dose following phone consultation with a nephrologist. He has been instructed to discontinue Actos. Patient is tolerating a by mouth well and states and further abdominal discomfort today.He'll have followup blood glucose done this pm and if his blood sugars remain greater than 90 he'll be discharged home for  outpatient followup.  case manager has set patient up for home health RN to assist with diabetes monitoring and has also provided him with community resources where meals are served daily.  Hyperkalemia  The patient was treated with Kayexalate, D50, and insulin on admission and followup recheck his potassium is still elevated and this meds were repeated as well as calcium gluconate. The patient's ACE inhibitor was discontinued. On followup today he is potassium is 5.0. Has been instructed not to take any further lisinopril upon discharge and followup of his chronic care physician.  Hyponatremia  Resolved with hydration. UTI (lower urinary tract infection)  -f as above patient's urinalysis on admission was consistent with a UTI and started on empiric antibiotics.he'll be discharged on oral antibiotics to complete the treatment course his to followup with his primary care physician.  H/O: gout  -Continue colchicine at decreased dose given his renal function and follow.  Essential hypertension, benign  -Patient is to continue his hydralazine and imdur as well as her beta blockers. His to followup with his primary care physician for further monitoring of his blood pressures and adjustment of his medications as appropriate for optimal blood pressure control. As discussed above the lisinopril was discontinued  secondary to #2.  Chronic renal insufficiency  Improved with hydration and also adjustment of his cultures and dose to once daily. His to followup with his PCP.  -improving last creatinine in May was 3.07- and it is 2.2 today, follow.  history of coronary artery disease/chest discomfort  His cardiac enzymes were cycled and came back negative. His to continue his beta blockers hydralazine Imdur upon discharge to followup outpatient. He remained chest pain-free during this hospital stay.    Discharge Vitals:  BP 156/74  Pulse 72  Temp 98.6 F (37 C) (Oral)  Resp 19  Ht 5\' 7"  (1.702 m)  Wt 92.08 kg (203 lb)  BMI 31.79 kg/m2  SpO2 98%  Discharge Labs:  Results for orders placed during the hospital encounter of 05/25/12 (from the past 24 hour(s))  GLUCOSE, CAPILLARY     Status: Normal   Collection Time   05/26/12  5:08 PM      Component Value Range   Glucose-Capillary 95  70 - 99 mg/dL  GLUCOSE, CAPILLARY     Status: Abnormal   Collection Time   05/26/12 10:11 PM      Component Value Range   Glucose-Capillary 142 (*) 70 - 99 mg/dL  BASIC METABOLIC PANEL     Status: Abnormal   Collection Time   05/27/12  4:58 AM      Component Value Range   Sodium 139  135 - 145 mEq/L   Potassium 5.0  3.5 - 5.1 mEq/L   Chloride 115 (*) 96 - 112 mEq/L   CO2 16 (*) 19 - 32 mEq/L   Glucose, Bld 125 (*) 70 - 99 mg/dL   BUN 22  6 - 23 mg/dL   Creatinine, Ser 5.78 (*) 0.50 - 1.35 mg/dL   Calcium 8.5  8.4 - 46.9 mg/dL   GFR calc non Af Amer 31 (*) >90 mL/min   GFR calc Af Amer 36 (*) >90 mL/min  GLUCOSE, CAPILLARY     Status: Abnormal   Collection Time   05/27/12  7:21 AM      Component Value Range   Glucose-Capillary 120 (*) 70 - 99 mg/dL  GLUCOSE, CAPILLARY     Status: Abnormal   Collection Time   05/27/12 11:58 AM  Component Value Range   Glucose-Capillary 219 (*) 70 - 99 mg/dL    Signed: Rannie Craney C 05/27/2012, 2:43 PM

## 2012-05-27 NOTE — Progress Notes (Addendum)
Cm spoke with patient concerning CM consult for meal assistance. Patient provided information concerning [facility] with resources for mobile meals. Patient also provided with information concerning community resources where meals served daily. Patient lives alone. Patient request [facility] for disease management but has no phone service. Patient states having [employer] phone but has no signal service in area. Patient has two daughters who he states only visit twice yearly. Patient had to drive to [facility] to call 911. CM contacted [facility] to see if [employer] services possible without phone service. Per [facility] rep, HH services possible if patient agrees with service providers just popping up & an emergency contact number is provided. Emergency Contact Chetan Mehring, daughter [phone]. Patient agrees with plan of care. CSW contacted concerning tx home.   Leonie Green [phone]

## 2013-01-16 ENCOUNTER — Encounter: Payer: Self-pay | Admitting: *Deleted

## 2013-01-16 DIAGNOSIS — E119 Type 2 diabetes mellitus without complications: Secondary | ICD-10-CM | POA: Insufficient documentation

## 2013-02-03 ENCOUNTER — Observation Stay (HOSPITAL_COMMUNITY): Payer: Medicare Other

## 2013-02-03 ENCOUNTER — Inpatient Hospital Stay (HOSPITAL_COMMUNITY)
Admission: EM | Admit: 2013-02-03 | Discharge: 2013-02-13 | DRG: 683 | Disposition: A | Discharge status: Skilled Nursing Facility | Payer: Medicare Other | Attending: Internal Medicine | Admitting: Internal Medicine

## 2013-02-03 ENCOUNTER — Emergency Department (HOSPITAL_COMMUNITY): Payer: Medicare Other

## 2013-02-03 ENCOUNTER — Inpatient Hospital Stay (HOSPITAL_COMMUNITY): Payer: Medicare Other

## 2013-02-03 ENCOUNTER — Encounter (HOSPITAL_COMMUNITY): Payer: Self-pay | Admitting: Emergency Medicine

## 2013-02-03 DIAGNOSIS — E162 Hypoglycemia, unspecified: Secondary | ICD-10-CM

## 2013-02-03 DIAGNOSIS — C7951 Secondary malignant neoplasm of bone: Secondary | ICD-10-CM | POA: Diagnosis present

## 2013-02-03 DIAGNOSIS — K5289 Other specified noninfective gastroenteritis and colitis: Secondary | ICD-10-CM | POA: Diagnosis present

## 2013-02-03 DIAGNOSIS — N189 Chronic kidney disease, unspecified: Secondary | ICD-10-CM

## 2013-02-03 DIAGNOSIS — E876 Hypokalemia: Secondary | ICD-10-CM | POA: Diagnosis not present

## 2013-02-03 DIAGNOSIS — E871 Hypo-osmolality and hyponatremia: Secondary | ICD-10-CM | POA: Diagnosis present

## 2013-02-03 DIAGNOSIS — G893 Neoplasm related pain (acute) (chronic): Secondary | ICD-10-CM | POA: Diagnosis present

## 2013-02-03 DIAGNOSIS — I2510 Atherosclerotic heart disease of native coronary artery without angina pectoris: Secondary | ICD-10-CM | POA: Diagnosis present

## 2013-02-03 DIAGNOSIS — N139 Obstructive and reflux uropathy, unspecified: Secondary | ICD-10-CM

## 2013-02-03 DIAGNOSIS — M109 Gout, unspecified: Secondary | ICD-10-CM | POA: Diagnosis present

## 2013-02-03 DIAGNOSIS — E872 Acidosis: Secondary | ICD-10-CM | POA: Diagnosis present

## 2013-02-03 DIAGNOSIS — Z87891 Personal history of nicotine dependence: Secondary | ICD-10-CM

## 2013-02-03 DIAGNOSIS — I10 Essential (primary) hypertension: Secondary | ICD-10-CM

## 2013-02-03 DIAGNOSIS — C7919 Secondary malignant neoplasm of other urinary organs: Secondary | ICD-10-CM | POA: Diagnosis present

## 2013-02-03 DIAGNOSIS — I252 Old myocardial infarction: Secondary | ICD-10-CM

## 2013-02-03 DIAGNOSIS — E119 Type 2 diabetes mellitus without complications: Secondary | ICD-10-CM | POA: Diagnosis present

## 2013-02-03 DIAGNOSIS — Z79899 Other long term (current) drug therapy: Secondary | ICD-10-CM

## 2013-02-03 DIAGNOSIS — K7689 Other specified diseases of liver: Secondary | ICD-10-CM | POA: Diagnosis present

## 2013-02-03 DIAGNOSIS — N179 Acute kidney failure, unspecified: Principal | ICD-10-CM | POA: Diagnosis present

## 2013-02-03 DIAGNOSIS — D631 Anemia in chronic kidney disease: Secondary | ICD-10-CM | POA: Diagnosis present

## 2013-02-03 DIAGNOSIS — E785 Hyperlipidemia, unspecified: Secondary | ICD-10-CM | POA: Diagnosis present

## 2013-02-03 DIAGNOSIS — I129 Hypertensive chronic kidney disease with stage 1 through stage 4 chronic kidney disease, or unspecified chronic kidney disease: Secondary | ICD-10-CM | POA: Diagnosis present

## 2013-02-03 DIAGNOSIS — N289 Disorder of kidney and ureter, unspecified: Secondary | ICD-10-CM

## 2013-02-03 DIAGNOSIS — D630 Anemia in neoplastic disease: Secondary | ICD-10-CM | POA: Diagnosis present

## 2013-02-03 DIAGNOSIS — N183 Chronic kidney disease, stage 3 unspecified: Secondary | ICD-10-CM | POA: Diagnosis present

## 2013-02-03 DIAGNOSIS — N320 Bladder-neck obstruction: Secondary | ICD-10-CM | POA: Diagnosis present

## 2013-02-03 DIAGNOSIS — N1330 Unspecified hydronephrosis: Secondary | ICD-10-CM | POA: Diagnosis present

## 2013-02-03 DIAGNOSIS — E875 Hyperkalemia: Secondary | ICD-10-CM

## 2013-02-03 DIAGNOSIS — E669 Obesity, unspecified: Secondary | ICD-10-CM | POA: Diagnosis present

## 2013-02-03 DIAGNOSIS — Z6830 Body mass index (BMI) 30.0-30.9, adult: Secondary | ICD-10-CM

## 2013-02-03 DIAGNOSIS — C61 Malignant neoplasm of prostate: Secondary | ICD-10-CM | POA: Diagnosis present

## 2013-02-03 DIAGNOSIS — C7952 Secondary malignant neoplasm of bone marrow: Secondary | ICD-10-CM | POA: Diagnosis present

## 2013-02-03 DIAGNOSIS — N259 Disorder resulting from impaired renal tubular function, unspecified: Secondary | ICD-10-CM

## 2013-02-03 LAB — CBC WITH DIFFERENTIAL/PLATELET
Basophils Relative: 0 % (ref 0–1)
Eosinophils Absolute: 0 10*3/uL (ref 0.0–0.7)
MCH: 26.7 pg (ref 26.0–34.0)
MCHC: 34.8 g/dL (ref 30.0–36.0)
Monocytes Relative: 6 % (ref 3–12)
Neutrophils Relative %: 87 % — ABNORMAL HIGH (ref 43–77)
Platelets: 277 10*3/uL (ref 150–400)

## 2013-02-03 LAB — COMPREHENSIVE METABOLIC PANEL
Albumin: 3.4 g/dL — ABNORMAL LOW (ref 3.5–5.2)
Alkaline Phosphatase: 1452 U/L — ABNORMAL HIGH (ref 39–117)
BUN: 55 mg/dL — ABNORMAL HIGH (ref 6–23)
Calcium: 9.1 mg/dL (ref 8.4–10.5)
Potassium: 5.1 mEq/L (ref 3.5–5.1)
Sodium: 134 mEq/L — ABNORMAL LOW (ref 135–145)
Total Protein: 6.8 g/dL (ref 6.0–8.3)

## 2013-02-03 LAB — OCCULT BLOOD, POC DEVICE: Fecal Occult Bld: NEGATIVE

## 2013-02-03 LAB — LACTIC ACID, PLASMA: Lactic Acid, Venous: 1.1 mmol/L (ref 0.5–2.2)

## 2013-02-03 MED ORDER — LORAZEPAM 1 MG PO TABS: 1.0000 mg | ORAL_TABLET | Freq: Once | ORAL | Status: DC

## 2013-02-03 MED ORDER — SODIUM CHLORIDE 0.9 % IV BOLUS (SEPSIS)
1000.0000 mL | INTRAVENOUS | Status: AC
  Administered 2013-02-03: 1000 mL via INTRAVENOUS

## 2013-02-03 MED ORDER — PIPERACILLIN-TAZOBACTAM IN DEX 2-0.25 GM/50ML IV SOLN
2.2500 g | Freq: Three times a day (TID) | INTRAVENOUS | Status: DC
  Administered 2013-02-04 – 2013-02-07 (×9): 2.25 g via INTRAVENOUS
  Filled 2013-02-03 (×13): qty 50

## 2013-02-03 MED ORDER — SODIUM BICARBONATE 8.4 % IV SOLN
INTRAVENOUS | Status: DC
  Administered 2013-02-03 – 2013-02-13 (×15): via INTRAVENOUS
  Filled 2013-02-03 (×34): qty 150

## 2013-02-03 MED ORDER — ENOXAPARIN SODIUM 30 MG/0.3ML ~~LOC~~ SOLN
30.0000 mg | SUBCUTANEOUS | Status: DC
  Administered 2013-02-04 – 2013-02-13 (×10): 30 mg via SUBCUTANEOUS
  Filled 2013-02-03 (×10): qty 0.3

## 2013-02-03 MED ORDER — MORPHINE SULFATE 4 MG/ML IJ SOLN
4.0000 mg | Freq: Once | INTRAMUSCULAR | Status: AC
  Administered 2013-02-03: 4 mg via INTRAVENOUS
  Filled 2013-02-03: qty 1

## 2013-02-03 MED ORDER — METOPROLOL TARTRATE 50 MG PO TABS
50.0000 mg | ORAL_TABLET | Freq: Two times a day (BID) | ORAL | Status: DC
  Administered 2013-02-04 – 2013-02-13 (×18): 50 mg via ORAL
  Filled 2013-02-03 (×22): qty 1

## 2013-02-03 MED ORDER — LORAZEPAM 2 MG/ML IJ SOLN
1.0000 mg | Freq: Once | INTRAMUSCULAR | Status: AC
  Administered 2013-02-04: 1 mg via INTRAVENOUS

## 2013-02-03 MED ORDER — SODIUM CHLORIDE 0.9 % IV SOLN: INTRAVENOUS | Status: AC

## 2013-02-03 MED ORDER — AMLODIPINE BESYLATE 5 MG PO TABS
5.0000 mg | ORAL_TABLET | Freq: Every day | ORAL | Status: DC
  Administered 2013-02-04 – 2013-02-07 (×3): 5 mg via ORAL
  Filled 2013-02-03 (×4): qty 1

## 2013-02-03 MED ORDER — LORAZEPAM 1 MG PO TABS
2.0000 mg | ORAL_TABLET | Freq: Once | ORAL | Status: AC
  Administered 2013-02-03: 2 mg via ORAL
  Filled 2013-02-03: qty 2

## 2013-02-03 MED ORDER — HYDRALAZINE HCL 25 MG PO TABS
25.0000 mg | ORAL_TABLET | Freq: Three times a day (TID) | ORAL | Status: DC
  Administered 2013-02-04 – 2013-02-13 (×26): 25 mg via ORAL
  Filled 2013-02-03 (×33): qty 1

## 2013-02-03 MED ORDER — SODIUM CHLORIDE 0.9 % IV SOLN
INTRAVENOUS | Status: DC
  Administered 2013-02-04 – 2013-02-05 (×2): via INTRAVENOUS

## 2013-02-03 MED ORDER — ONDANSETRON HCL 4 MG/2ML IJ SOLN
4.0000 mg | Freq: Once | INTRAMUSCULAR | Status: AC
  Administered 2013-02-03: 4 mg via INTRAVENOUS
  Filled 2013-02-03: qty 2

## 2013-02-03 MED ORDER — SIMVASTATIN 20 MG PO TABS
20.0000 mg | ORAL_TABLET | Freq: Every day | ORAL | Status: DC
Start: 2013-02-03 — End: 2013-02-13
  Administered 2013-02-04 – 2013-02-12 (×9): 20 mg via ORAL
  Filled 2013-02-03 (×14): qty 1

## 2013-02-03 MED ORDER — NITROGLYCERIN 0.4 MG SL SUBL: 0.4000 mg | SUBLINGUAL_TABLET | SUBLINGUAL | Status: DC | PRN

## 2013-02-03 MED ORDER — PIPERACILLIN-TAZOBACTAM 3.375 G IVPB 30 MIN
3.3750 g | Freq: Once | INTRAVENOUS | Status: AC
  Administered 2013-02-03: 3.375 g via INTRAVENOUS
  Filled 2013-02-03 (×2): qty 50

## 2013-02-03 MED ORDER — IOHEXOL 300 MG/ML  SOLN: 25.0000 mL | INTRAMUSCULAR | Status: DC

## 2013-02-03 MED ORDER — LORAZEPAM 2 MG/ML IJ SOLN
INTRAMUSCULAR | Status: AC
  Filled 2013-02-03: qty 1

## 2013-02-03 MED ORDER — TAMSULOSIN HCL 0.4 MG PO CAPS
0.4000 mg | ORAL_CAPSULE | Freq: Every day | ORAL | Status: DC
Start: 2013-02-04 — End: 2013-02-13
  Administered 2013-02-04 – 2013-02-13 (×10): 0.4 mg via ORAL
  Filled 2013-02-03 (×10): qty 1

## 2013-02-03 NOTE — Consult Note (Signed)
[facility] Renal Consultation Note  Requesting MD: Blake Divine Indication for Consultation: Acute on chronic renal failure  HPI:  [name] is a 78 y.o. male with PMhx significant for CKD, with creatinines in the past in the high 2's and 3's followed by Dr. Lowell Guitar at [facility].  He also has a history of prostate CA and hydronephrosis in the past. He presents today to the ER with a 3-4 day history of abdominal pain not necessarily associated with nausea or vomiting but does admit to decreased PO intake.  His initial abd film is possibly consistent with enteritis and he is getting a CT to further investigate.  Labs show acute on chronic renal failure although the last measurement here was about 9 months ago.  We are asked to assist with his A on CRF.  They are also unable to get IV access, so he is getting a central line.  He was on lasix PTA but not on ACE or ARB.  He denies NSAIDS PTA but does admit to obstructive bladder sxms.  As I am evaluating him, his abdominal pain is resolved but the ultrasound results are now back with bilateral hydronephrosis.    Creatinine, Ser  Date/Time Value Range Status  02/03/2013  4:03 PM 6.94* 0.50 - 1.35 mg/dL Final  03/06/8118  1:47 AM 1.99* 0.50 - 1.35 mg/dL Final  07/26/5620  3:08 AM 2.06* 0.50 - 1.35 mg/dL Final  6/57/8469  6:29 AM 2.23* 0.50 - 1.35 mg/dL Final  04/25/4131  4:40 PM 2.28* 0.50 - 1.35 mg/dL Final  11/30/7251  6:64 AM 2.22* 0.50 - 1.35 mg/dL Final  02/28/4741  5:95 AM 2.20* 0.50 - 1.35 mg/dL Final  05/01/8755  4:33 PM 3.07* 0.50 - 1.35 mg/dL Final  01/06/5187 41:66 PM 2.59* 0.50 - 1.35 mg/dL Final     **Please note change in reference range.**  05/17/2011  4:10 AM 3.53* 0.50 - 1.35 mg/dL Final     **Please note change in reference range.**  05/10/2011 11:50 AM 2.96* 0.4 - 1.5 mg/dL Final  0/04/3015  0:10 AM 3.85* 0.4 - 1.5 mg/dL Final  9/32/3557  3:22 AM 3.55* 0.4 - 1.5 mg/dL Final  0/25/4270  6:23 AM 3.54* 0.4 - 1.5 mg/dL Final  7/62/8315   1:76 AM 3.16* 0.4 - 1.5 mg/dL Final  1/60/7371  0:62 PM 2.66* 0.4 - 1.5 mg/dL Final  6/94/8546  2:70 AM 2.44* 0.4 - 1.5 mg/dL Final  3/50/0938  1:82 AM 2.79* 0.4 - 1.5 mg/dL Final  9/93/7169  6:78 AM 2.72* 0.4 - 1.5 mg/dL Final  9/38/1017  5:10 AM 3.1* 0.4 - 1.5 mg/dL Final  2/58/5277 82:42 AM 3.22* 0.4 - 1.5 mg/dL Final  3/53/6144  3:15 AM 3.28* 0.4 - 1.5 mg/dL Final  4/00/8676  1:95 AM 3.70* 0.4 - 1.5 mg/dL Final  0/93/2671  2:45 AM 4.20* 0.4 - 1.5 mg/dL Final  07/06/9832  8:25 AM 4.06* 0.4 - 1.5 mg/dL Final  0/03/3975  7:34 AM 3.82* 0.4 - 1.5 mg/dL Final  12/06/3788  2:40 PM 3.92* 0.4 - 1.5 mg/dL Final  08/05/3531 99:24 PM 3.95* 0.4 - 1.5 mg/dL Final  26/06/3418  6:22 AM 2.31* 0.4 - 1.5 mg/dL Final  29/05/9891  1:19 AM 2.84* 0.4 - 1.5 mg/dL Final  41/05/4080  4:48 AM 2.88* 0.4 - 1.5 mg/dL Final  18/03/6313 97:02 AM 2.4* 0.4 - 1.5 mg/dL Final  04/30/7857 85:02 AM 2.16* 0.40 - 1.50 mg/dL Final     Result repeated and verified.  04/22/2010 12:20  AM 2.07* 0.4 - 1.5 mg/dL Final  2/95/6213  0:86 PM 1.94* 0.4 - 1.5 mg/dL Final  5/78/4696  2:95 AM 2.02* 0.4 - 1.5 mg/dL Final  2/84/1324  4:01 PM 2.2* 0.4 - 1.5 mg/dL Final  0/27/2536 64:40 AM 2.06* 0.40 - 1.50 mg/dL Final     Result repeated and verified.  07/30/2009  8:31 AM 2.43* 0.40 - 1.50 mg/dL Final     Result repeated and verified.  05/06/2009 10:19 AM 1.94* 0.40 - 1.50 mg/dL Final     PMHx:   Past Medical History  Diagnosis Date  . CAD (coronary artery disease)   . Hypertension   . Obesity   . Dyslipidemia   . Myocardial infarction 2007  . H/O allergic rhinitis   . Cancer     metastatic prostate cancer  . Anemia   . H/O: gout   . H/O hemorrhoids   . Pneumonia   . H/O: GI bleed   . Bone metastasis   . Diabetes mellitus   . Arthritis   . Rhinitis, allergic   . Carpal tunnel syndrome   . Renal insufficiency     ckd III  . Renal insufficiency   . Fatty liver   . Shoulder dislocation 1970    right    Past Surgical History   Procedure Laterality Date  . Transurethral resection of prostate  06/08/2011, 11/08/06  . Right hand surgery  1974    Family Hx:  Family History  Problem Relation Age of Onset  . Diabetes Mother     Social History:  reports that he has quit smoking. He has never used smokeless tobacco. He reports that he does not drink alcohol or use illicit drugs.  Allergies:  Allergies  Allergen Reactions  . Aspirin   . Feldene (Piroxicam) Swelling    Medications: Prior to Admission medications   Medication Sig Start Date End Date Taking? Authorizing Provider  allopurinol (ZYLOPRIM) 100 MG tablet Take 200 mg by mouth daily.    Historical Provider, MD  amLODipine (NORVASC) 5 MG tablet Take 5 mg by mouth daily.    Historical Provider, MD  colchicine 0.6 MG tablet Take 1 tablet (0.6 mg total) by mouth daily. 05/27/12   Kela Millin, MD  furosemide (LASIX) 20 MG tablet Take 20 mg by mouth daily.    Historical Provider, MD  hydrALAZINE (APRESOLINE) 25 MG tablet Take 25 mg by mouth 3 (three) times daily.      Historical Provider, MD  isosorbide mononitrate (IMDUR) 60 MG 24 hr tablet Take 60 mg by mouth daily.      Historical Provider, MD  metoprolol (LOPRESSOR) 50 MG tablet Take 50 mg by mouth 2 (two) times daily.      Historical Provider, MD  nitroGLYCERIN (NITROSTAT) 0.4 MG SL tablet Place 0.4 mg under the tongue every 5 (five) minutes as needed.      Historical Provider, MD  simvastatin (ZOCOR) 20 MG tablet Take 20 mg by mouth at bedtime.      Historical Provider, MD  sitaGLIPtin (JANUVIA) 25 MG tablet Take 1 tablet (25 mg total) by mouth daily with breakfast. 05/27/12 05/27/13  Kela Millin, MD  Tamsulosin HCl (FLOMAX) 0.4 MG CAPS Take 0.4 mg by mouth daily.    Historical Provider, MD  Vitamin D, Ergocalciferol, (DRISDOL) 50000 UNITS CAPS Take 50,000 Units by mouth 2 (two) times a week.    Historical Provider, MD    I have reviewed the patient's current medications.  Labs:  Results for  orders placed during the hospital encounter of 02/03/13 (from the past 48 hour(s))  CBC WITH DIFFERENTIAL     Status: Abnormal   Collection Time    02/03/13  4:03 PM      Result Value Range   WBC 7.2  4.0 - 10.5 K/uL   RBC 3.97 (*) 4.22 - 5.81 MIL/uL   Hemoglobin 10.6 (*) 13.0 - 17.0 g/dL   HCT 16.1 (*) 09.6 - 04.5 %   MCV 76.8 (*) 78.0 - 100.0 fL   MCH 26.7  26.0 - 34.0 pg   MCHC 34.8  30.0 - 36.0 g/dL   RDW 40.9 (*) 81.1 - 91.4 %   Platelets 277  150 - 400 K/uL   Neutrophils Relative 87 (*) 43 - 77 %   Neutro Abs 6.3  1.7 - 7.7 K/uL   Lymphocytes Relative 7 (*) 12 - 46 %   Lymphs Abs 0.5 (*) 0.7 - 4.0 K/uL   Monocytes Relative 6  3 - 12 %   Monocytes Absolute 0.4  0.1 - 1.0 K/uL   Eosinophils Relative 0  0 - 5 %   Eosinophils Absolute 0.0  0.0 - 0.7 K/uL   Basophils Relative 0  0 - 1 %   Basophils Absolute 0.0  0.0 - 0.1 K/uL  COMPREHENSIVE METABOLIC PANEL     Status: Abnormal   Collection Time    02/03/13  4:03 PM      Result Value Range   Sodium 134 (*) 135 - 145 mEq/L   Potassium 5.1  3.5 - 5.1 mEq/L   Chloride 103  96 - 112 mEq/L   CO2 16 (*) 19 - 32 mEq/L   Glucose, Bld 282 (*) 70 - 99 mg/dL   BUN 55 (*) 6 - 23 mg/dL   Creatinine, Ser 7.82 (*) 0.50 - 1.35 mg/dL   Calcium 9.1  8.4 - 95.6 mg/dL   Total Protein 6.8  6.0 - 8.3 g/dL   Albumin 3.4 (*) 3.5 - 5.2 g/dL   AST 72 (*) 0 - 37 U/L   ALT 12  0 - 53 U/L   Alkaline Phosphatase 1452 (*) 39 - 117 U/L   Total Bilirubin 0.4  0.3 - 1.2 mg/dL   GFR calc non Af Amer 7 (*) >90 mL/min   GFR calc Af Amer 8 (*) >90 mL/min   Comment:            The eGFR has been calculated     using the CKD EPI equation.     This calculation has not been     validated in all clinical     situations.     eGFR's persistently     <90 mL/min signify     possible Chronic Kidney Disease.  OCCULT BLOOD, POC DEVICE     Status: None   Collection Time    02/03/13  4:19 PM      Result Value Range   Fecal Occult Bld NEGATIVE  NEGATIVE      ROS:  Patient denies fever, chills, weight loss, chest pain, SOB, DOE, edema.  He does admit to obstructive bladder symptoms, no dysuria or hematuria.  The remainder of the ROS is negative    Physical Exam: Filed Vitals:   02/03/13 1830  BP:   Pulse: 86  Temp:   Resp:      General: well appearing, NAD HEENT: PERRLA, EOMI, mucous membranes slightly dry, no lesions Neck: No JVD, no lymphadenopathy Heart:  RRR, no murmers, gallops or rub Lungs: CTAB, no wheezes or rales Abdomen: slightly distended, non tender, no guarding or rebound (did have morphine earlier) Extremities: no clubbing, cyanosis or edema Skin: warm and dry Neuro: alert, nonfocal   Assessment/Plan: 78 year old BM with acute on chronic renal failure in the setting of abdominal pain and decreased PO intake.  1.Renal- Acute on chronic renal failure.  I agree with checking U/A and renal ultrasound per primary team- this just in, renal ultrasound shows bilateral hydro and some type of bladder mass? Will place foley catheter but likely will need urology input. (i did communicate this to the primary team)    He does not seem wet so also agree with IV hydration once IV is established.  There are no absolute indications for dialysis right now, would recommend hydration with sodium bicarbonate fluids overnight and we will follow his labs and UOP.  Will place foley catheter as well to see if bladder will drain.  2. Hypertension/volume  - As above, does not seem wet, could be dry although BP is not low, hydrate with sodium bicarbonate fluids 3. Anemia  - Is present and likely associated with his CKD.  It may also worsen with hydration.  Hgb not low enough right now to need ESA but will continue to follow and institute if needed.     Thank you for this consult, we will continue to follow    GOLDSBOROUGH,KELLIE A 02/03/2013, 8:14 PM

## 2013-02-03 NOTE — ED Notes (Signed)
IV will be here as soon as possible to start IV.

## 2013-02-03 NOTE — ED Notes (Signed)
Pt c/o abd pain "all over" and right shoulder and right leg are hurting and now that he doesn't have heat he can't sleep at his house. Pt reports he hasn't been eating much.  LBM 5 days ago. Pt denies nausea. Pt has odor of stool. Pt skin warm and dry, resp e/u.

## 2013-02-03 NOTE — Progress Notes (Addendum)
Patient is agitated and combative, wanting to go home. He is unsteady on his feet and pulling on his central line. Called security to the room. Notified MD on call. Will continue to monitor. KYoung RN  Pt. Is also refusing foley cath. Dr. Dallas Schimke aware.  KYoung RN

## 2013-02-03 NOTE — H&P (Signed)
[facility] History and Physical  [name] RUE:[id number] DOB: [birth year] DOA: 02/03/2013   PCP: [name] Grosser, MD    Chief Complaint: abdominal pain.   HPI: [name] is a 78 y.o. male with multiple medical problems including a metastatic prostate cancer, CAD, hypertension, came in for abd pain since 3 to4 days not associated with nausea or vomiting. He reports intermittent constipation and diarrhea with the abdominal pain. He denies any fever or chills. He reports decreased apetite and decreased po intake the last 3 days. On arrival to ED he had a abd X RAY shows some small bowel thickening possibly enteritis. His lab work is significant for ARF, with elevated alk phos . He is being admitted to medical service for further management.   Review of Systems: The patient denies anorexia, fever, weight loss,, vision loss, decreased hearing, hoarseness, chest pain, syncope, dyspnea on exertion, peripheral edema, balance deficits, hemoptysis, melena, hematochezia, severe indigestion/heartburn, hematuria, incontinence, genital sores, muscle weakness, suspicious skin lesions, transient blindness, difficulty walking, depression, unusual weight change, abnormal bleeding, enlarged lymph nodes, angioedema, and breast masses.    Past Medical History  Diagnosis Date  . CAD (coronary artery disease)   . Hypertension   . Obesity   . Dyslipidemia   . Myocardial infarction 2007  . H/O allergic rhinitis   . Cancer     metastatic prostate cancer  . Anemia   . H/O: gout   . H/O hemorrhoids   . Pneumonia   . H/O: GI bleed   . Bone metastasis   . Diabetes mellitus   . Arthritis   . Rhinitis, allergic   . Carpal tunnel syndrome   . Renal insufficiency     ckd III  . Renal insufficiency   . Fatty liver   . Shoulder dislocation 1970    right   Past Surgical History  Procedure Laterality Date  . Transurethral resection of prostate  06/08/2011, 11/08/06  . Right hand surgery  1974    Social History:  reports that he has quit smoking. He has never used smokeless tobacco. He reports that he does not drink alcohol or use illicit drugs.  where does patient live--home,  Allergies  Allergen Reactions  . Aspirin   . Feldene (Piroxicam) Swelling    Family History  Problem Relation Age of Onset  . Diabetes Mother     Prior to Admission medications   Medication Sig Start Date End Date Taking? Authorizing Provider  allopurinol (ZYLOPRIM) 100 MG tablet Take 200 mg by mouth daily.    Historical Provider, MD  amLODipine (NORVASC) 5 MG tablet Take 5 mg by mouth daily.    Historical Provider, MD  colchicine 0.6 MG tablet Take 1 tablet (0.6 mg total) by mouth daily. 05/27/12   Kela Millin, MD  furosemide (LASIX) 20 MG tablet Take 20 mg by mouth daily.    Historical Provider, MD  hydrALAZINE (APRESOLINE) 25 MG tablet Take 25 mg by mouth 3 (three) times daily.      Historical Provider, MD  isosorbide mononitrate (IMDUR) 60 MG 24 hr tablet Take 60 mg by mouth daily.      Historical Provider, MD  metoprolol (LOPRESSOR) 50 MG tablet Take 50 mg by mouth 2 (two) times daily.      Historical Provider, MD  nitroGLYCERIN (NITROSTAT) 0.4 MG SL tablet Place 0.4 mg under the tongue every 5 (five) minutes as needed.      Historical Provider, MD  simvastatin (ZOCOR) 20 MG tablet Take  20 mg by mouth at bedtime.      Historical Provider, MD  sitaGLIPtin (JANUVIA) 25 MG tablet Take 1 tablet (25 mg total) by mouth daily with breakfast. 05/27/12 05/27/13  Kela Millin, MD  Tamsulosin HCl (FLOMAX) 0.4 MG CAPS Take 0.4 mg by mouth daily.    Historical Provider, MD  Vitamin D, Ergocalciferol, (DRISDOL) 50000 UNITS CAPS Take 50,000 Units by mouth 2 (two) times a week.    Historical Provider, MD   Physical Exam: Filed Vitals:   02/03/13 1500 02/03/13 1600 02/03/13 1745 02/03/13 1830  BP: 145/85  146/87   Pulse: 84 87 88 86  Temp:      TempSrc:      Resp:      SpO2: 99% 98% 99% 97%     Constitutional: Vital signs reviewed.  Patient is a well-developed and well-nourishedin no acute distress and cooperative with exam. Alert and oriented x3.  Head: Normocephalic and atraumatic Ear: TM normal bilaterally Mouth: no erythema or exudates, MMM Eyes: PERRL, EOMI, conjunctivae normal, No scleral icterus.  Neck: Supple, Trachea midline normal ROM, No JVD, mass, thyromegaly, or carotid bruit present.  Cardiovascular: RRR, S1 normal, S2 normal, no MRG, pulses symmetric and intact bilaterally Pulmonary/Chest: CTAB, no wheezes, rales, or rhonchi Abdominal: Soft. Non-tender, non-distended, bowel sounds are normal, no masses, organomegaly, or guarding present.  GU: no CVA tenderness Musculoskeletal: No joint deformities, erythema, or stiffness, ROM full and no nontender Hematology: no cervical, inginal, or axillary adenopathy.  Neurological: A&O x3, Strength is normal and symmetric bilaterally, cranial nerve II-XII are grossly intact, no focal motor deficit, sensory intact to light touch bilaterally.  Skin: Warm, dry and intact. No rash, cyanosis, or clubbing.  Psychiatric: Normal mood and affect. speech and behavior is normal. Judgment and thought content normal. Cognition and memory are normal.     Labs on Admission:  Basic Metabolic Panel:  Recent Labs Lab 02/03/13 1603  NA 134*  K 5.1  CL 103  CO2 16*  GLUCOSE 282*  BUN 55*  CREATININE 6.94*  CALCIUM 9.1   Liver Function Tests:  Recent Labs Lab 02/03/13 1603  AST 72*  ALT 12  ALKPHOS 1452*  BILITOT 0.4  PROT 6.8  ALBUMIN 3.4*   No results found for this basename: LIPASE, AMYLASE,  in the last 168 hours No results found for this basename: AMMONIA,  in the last 168 hours CBC:  Recent Labs Lab 02/03/13 1603  WBC 7.2  NEUTROABS 6.3  HGB 10.6*  HCT 30.5*  MCV 76.8*  PLT 277   Cardiac Enzymes: No results found for this basename: CKTOTAL, CKMB, CKMBINDEX, TROPONINI,  in the last 168 hours  BNP (last  3 results) No results found for this basename: PROBNP,  in the last 8760 hours CBG: No results found for this basename: GLUCAP,  in the last 168 hours  Radiological Exams on Admission: Dg Abd Acute W/chest  02/03/2013  *RADIOLOGY REPORT*  Clinical Data: Chronic shortness of breath, constipation, abdominal pain  ACUTE ABDOMEN SERIES (ABDOMEN 2 VIEW & CHEST 1 VIEW)  Comparison: Chest radiographs dated 05/25/2012  Findings: Lungs are essentially clear.  No focal consolidation. Small right pleural effusion.  Mild cardiomegaly.  Nonspecific bowel gas pattern with mucosal thickening involving multiple loops of small bowel in the mid abdomen.  The colon is not decompressed.  This appearance favors a small bowel enteritis and does not suggest small bowel obstruction.  No evidence of free air under the diaphragm on the upright  view.  Degenerative changes of the visualized thoracolumbar spine.  IMPRESSION: Small right pleural effusion.  Mucosal thickening involving multiple small bowel loops, possibly reflecting small bowel enteritis.  No evidence of small bowel obstruction or free air.   Original Report Authenticated By: Charline Bills, M.D.     EKG: unchanged from previous EKG.   Assessment/Plan Active Problems: 1. ARF on STAGE 3 CKD: from bilateral hydronephrosis, evident on US Kidney.will request urology for consultation.  Renal consultation requested form Dr Kathrene Bongo. 2. Metabolic acidosis; probably from the acute renal failure.  3. Mild hyponatremia 4. Abdominal pain: possibly from the bladder mass vs enteritis.  5.  enteritis: will obtain lactic acid to evaluate for ischemic bowel. Ct abd and pelvis will be ordered for further evaluation. 6. Anemia: microcytic.  7. Dehydration. IV  fluids ordered. Repeat bmp in am.  Code Status: full code Family Communication: none at bedside Disposition Plan: possibly home in 2 to 3 days, when renal function improves.     [facility] Pager [phone]  If 7PM-7AM, please contact night-coverage [email or web address] Password [facility] 02/03/2013, 7:19 PM

## 2013-02-03 NOTE — ED Provider Notes (Signed)
Medical screening examination/treatment/procedure(s) were conducted as a shared visit with non-physician practitioner(s) and myself.  I personally evaluated the patient during the encounter   [name] Sprout, MD 02/03/13 2315

## 2013-02-03 NOTE — Consult Note (Signed)
Urology Consult   Physician requesting consult: Dr. Blake Divine  Reason for consult: Bilateral hydronephrosis, metastatic prostate cancer, ARF  History of Present Illness: [name] is a 78 y.o. patient who has been followed by Dr. Jeannett Senior Dalhstedt and Dr. Eli Hose for metastatic prostate cancer. He has been very non-compliant and did not show for appointments both in January and last week in our office. He presented to the emergency department tonight with complaints of worsening severe diffuse upper abdominal pain. He has denies lower abdominal or suprapubic pain.  He denies back pain. He was found to have a Cr of 6.9 in the ED which is above his baseline of around 2-3.  He has known CKD and is followed by Dr. Lowell Guitar.  He had a renal ultrasound tonight which demonstrated severe bilateral hydronephrosis with a large bladder mass.  I reviewed this ultrasound and his bladder mass may be local extension of his prostate malignancy or could potentially be a bladder tumor.  He denies gross hematuria and denies any new difficulties voiding although is a poor historian.  It is unclear when he last received treatment for his prostate cancer.  He has known metastatic disease with bone metastases and he has received Lupron 45 mg as recently as the spring of 2013 but it is unclear if he has received any additional treatment since then. His PSA last May was 33.  He has been afebrile and denies nausea/vomiting, or shortness of breath. He has been constipated and has not had a bowel movement in 4-5 days.  Past Medical History  Diagnosis Date  . CAD (coronary artery disease)   . Hypertension   . Obesity   . Dyslipidemia   . Myocardial infarction 2007  . H/O allergic rhinitis   . Cancer     metastatic prostate cancer  . Anemia   . H/O: gout   . H/O hemorrhoids   . Pneumonia   . H/O: GI bleed   . Bone metastasis   . Diabetes mellitus   . Arthritis   . Rhinitis, allergic   . Carpal tunnel  syndrome   . Renal insufficiency     ckd III  . Renal insufficiency   . Fatty liver   . Shoulder dislocation 1970    right    Past Surgical History  Procedure Laterality Date  . Transurethral resection of prostate  06/08/2011, 11/08/06  . Right hand surgery  1974    Current Hospital Medications:    Medication List    ASK your doctor about these medications       allopurinol 100 MG tablet  Commonly known as:  ZYLOPRIM  Take 200 mg by mouth daily.     amLODipine 5 MG tablet  Commonly known as:  NORVASC  Take 5 mg by mouth daily.     colchicine 0.6 MG tablet  Take 1 tablet (0.6 mg total) by mouth daily.     furosemide 20 MG tablet  Commonly known as:  LASIX  Take 20 mg by mouth daily.     hydrALAZINE 25 MG tablet  Commonly known as:  APRESOLINE  Take 25 mg by mouth 3 (three) times daily.     isosorbide mononitrate 60 MG 24 hr tablet  Commonly known as:  IMDUR  Take 60 mg by mouth daily.     metoprolol 50 MG tablet  Commonly known as:  LOPRESSOR  Take 50 mg by mouth 2 (two) times daily.     nitroGLYCERIN 0.4 MG SL  tablet  Commonly known as:  NITROSTAT  Place 0.4 mg under the tongue every 5 (five) minutes as needed.     simvastatin 20 MG tablet  Commonly known as:  ZOCOR  Take 20 mg by mouth at bedtime.     sitaGLIPtin 25 MG tablet  Commonly known as:  JANUVIA  Take 1 tablet (25 mg total) by mouth daily with breakfast.     tamsulosin 0.4 MG Caps  Commonly known as:  FLOMAX  Take 0.4 mg by mouth daily.     Vitamin D (Ergocalciferol) 50000 UNITS Caps  Commonly known as:  DRISDOL  Take 50,000 Units by mouth 2 (two) times a week.        Scheduled Meds:  Continuous Infusions: . sodium chloride    . [START ON 02/04/2013] piperacillin-tazobactam (ZOSYN)  IV     PRN Meds:.  Allergies:  Allergies  Allergen Reactions  . Aspirin   . Feldene (Piroxicam) Swelling    Family History  Problem Relation Age of Onset  . Diabetes Mother     Social  History:  reports that he has quit smoking. He has never used smokeless tobacco. He reports that he does not drink alcohol or use illicit drugs.  ROS: A complete review of systems was performed.  All systems are negative except for pertinent findings as noted.  Physical Exam:  Vital signs in last 24 hours: Temp:  [96.2 F (35.7 C)-97.2 F (36.2 C)] 97.2 F (36.2 C) (03/09 1454) Pulse Rate:  [80-88] 86 (03/09 1830) Resp:  [16-20] 20 (03/09 1454) BP: (145-169)/(79-98) 146/87 mmHg (03/09 1745) SpO2:  [97 %-100 %] 97 % (03/09 1830) General:  Alert and oriented, No acute distress HEENT: Normocephalic, atraumatic Neck: No JVD or lymphadenopathy Cardiovascular: Regular rhythm, tachycardic Lungs: Clear bilaterally Abdomen: Soft, nondistended, he is currently nontender on exam, he does have suprapubic distention with his bladder able to be percussed up the umbilicus Back: No CVA tenderness Extremities: 1 + bilateral LE edema Neurologic: Grossly intact, no focal deficits  Laboratory Data:   Recent Labs  02/03/13 1603  WBC 7.2  HGB 10.6*  HCT 30.5*  PLT 277     Recent Labs  02/03/13 1603  NA 134*  K 5.1  CL 103  GLUCOSE 282*  BUN 55*  CALCIUM 9.1  CREATININE 6.94*     Results for orders placed during the hospital encounter of 02/03/13 (from the past 24 hour(s))  CBC WITH DIFFERENTIAL     Status: Abnormal   Collection Time    02/03/13  4:03 PM      Result Value Range   WBC 7.2  4.0 - 10.5 K/uL   RBC 3.97 (*) 4.22 - 5.81 MIL/uL   Hemoglobin 10.6 (*) 13.0 - 17.0 g/dL   HCT 16.1 (*) 09.6 - 04.5 %   MCV 76.8 (*) 78.0 - 100.0 fL   MCH 26.7  26.0 - 34.0 pg   MCHC 34.8  30.0 - 36.0 g/dL   RDW 40.9 (*) 81.1 - 91.4 %   Platelets 277  150 - 400 K/uL   Neutrophils Relative 87 (*) 43 - 77 %   Neutro Abs 6.3  1.7 - 7.7 K/uL   Lymphocytes Relative 7 (*) 12 - 46 %   Lymphs Abs 0.5 (*) 0.7 - 4.0 K/uL   Monocytes Relative 6  3 - 12 %   Monocytes Absolute 0.4  0.1 - 1.0 K/uL    Eosinophils Relative 0  0 - 5 %  Eosinophils Absolute 0.0  0.0 - 0.7 K/uL   Basophils Relative 0  0 - 1 %   Basophils Absolute 0.0  0.0 - 0.1 K/uL  COMPREHENSIVE METABOLIC PANEL     Status: Abnormal   Collection Time    02/03/13  4:03 PM      Result Value Range   Sodium 134 (*) 135 - 145 mEq/L   Potassium 5.1  3.5 - 5.1 mEq/L   Chloride 103  96 - 112 mEq/L   CO2 16 (*) 19 - 32 mEq/L   Glucose, Bld 282 (*) 70 - 99 mg/dL   BUN 55 (*) 6 - 23 mg/dL   Creatinine, Ser 9.81 (*) 0.50 - 1.35 mg/dL   Calcium 9.1  8.4 - 19.1 mg/dL   Total Protein 6.8  6.0 - 8.3 g/dL   Albumin 3.4 (*) 3.5 - 5.2 g/dL   AST 72 (*) 0 - 37 U/L   ALT 12  0 - 53 U/L   Alkaline Phosphatase 1452 (*) 39 - 117 U/L   Total Bilirubin 0.4  0.3 - 1.2 mg/dL   GFR calc non Af Amer 7 (*) >90 mL/min   GFR calc Af Amer 8 (*) >90 mL/min  OCCULT BLOOD, POC DEVICE     Status: None   Collection Time    02/03/13  4:19 PM      Result Value Range   Fecal Occult Bld NEGATIVE  NEGATIVE  LACTIC ACID, PLASMA     Status: None   Collection Time    02/03/13  9:05 PM      Result Value Range   Lactic Acid, Venous 1.1  0.5 - 2.2 mmol/L   No results found for this or any previous visit (from the past 240 hour(s)).  Renal Function:  Recent Labs  02/03/13 1603  CREATININE 6.94*   The CrCl is unknown because both a height and weight (above a minimum accepted value) are required for this calculation.  Radiologic Imaging: US Renal  02/03/2013  *RADIOLOGY REPORT*  Clinical Data: Acute renal failure.  History of prostate cancer.  RENAL/URINARY TRACT ULTRASOUND COMPLETE  Comparison:  None.  Findings:  Right Kidney:  Measures 11.1 cm.  There is marked hydronephrosis. No stone or mass identified.  Left Kidney:  Measures 11.3 cm.  There is severe hydronephrosis. No stone or mass identified.  Bladder:  A large mass lesion is identified within the urinary bladder.  IMPRESSION: Severe bilateral hydronephrosis likely due to a large mass lesion in  the urinary bladder.   Original Report Authenticated By: Holley Dexter, M.D.    Dg Chest Port 1 View  02/03/2013  *RADIOLOGY REPORT*  Clinical Data: Line placement.  PORTABLE CHEST - 1 VIEW  Comparison: Single view of the chest earlier this same date.  Findings: New right IJ catheter is in place.  Tip of the catheter is just within the right atrium and the line should be withdrawn 3 cm for better positioning.  No pneumothorax identified.  There is cardiomegaly and vascular congestion.  IMPRESSION:  1.  Tip of right IJ catheter projects just in the right atrium. The line should be withdrawn approximately 3 cm for better positioning.  No pneumothorax. 2.  Cardiomegaly and vascular congestion.   Original Report Authenticated By: Holley Dexter, M.D.    Dg Abd Acute W/chest  02/03/2013  *RADIOLOGY REPORT*  Clinical Data: Chronic shortness of breath, constipation, abdominal pain  ACUTE ABDOMEN SERIES (ABDOMEN 2 VIEW & CHEST 1 VIEW)  Comparison: Chest  radiographs dated 05/25/2012  Findings: Lungs are essentially clear.  No focal consolidation. Small right pleural effusion.  Mild cardiomegaly.  Nonspecific bowel gas pattern with mucosal thickening involving multiple loops of small bowel in the mid abdomen.  The colon is not decompressed.  This appearance favors a small bowel enteritis and does not suggest small bowel obstruction.  No evidence of free air under the diaphragm on the upright view.  Degenerative changes of the visualized thoracolumbar spine.  IMPRESSION: Small right pleural effusion.  Mucosal thickening involving multiple small bowel loops, possibly reflecting small bowel enteritis.  No evidence of small bowel obstruction or free air.   Original Report Authenticated By: Charline Bills, M.D.     I independently reviewed the above imaging studies.  Impression/Plan:  1) ARF with bilateral hydronephrosis and history of CKD: There may be a component of bladder outlet obstruction and his renal  function and hydronephrosis may improve with an indwelling urethral catheter. In the absence of any acute complications from his renal failure (i.e. arrythmia, severe electrolyte abnormalities) or signs of systemic infection, it would be reasonable to closely follow renal function and electrolytes to see if renal function improvement occurs and hydronephosis resolves with catheter placement.  If his renal function does not improve back to baseline or he develops fever, he will require renal drainage which will require bilateral nephrostomy tube placement by IR considering the extreme difficulty of retrograde ureteral stent placement in this clinical scenario.  2) Bladder mass: This may be local extension of his prostate cancer or possibly a bladder tumor. I will allow Dr. Retta Diones to determine plan for evaluation considering his knowledge of the patient's history although he may require cystoscopic evaluation at some point.  3) Metastatic prostate cancer: Patient will need the input of Dr. Retta Diones and Dr. Clelia Croft to determine appropriate course of action for ongoing treatment of his prostate cancer which likely will involve further androgen deprivation.  Pt has been non-compliant which apparently has resulted in delayed therapy and possible progression of his disease.  [name],[name] 02/03/2013, 10:11 PM    Moody Bruins. MD   CC: Dr. Blake Divine

## 2013-02-03 NOTE — Progress Notes (Signed)
ANTIBIOTIC CONSULT NOTE - INITIAL  Pharmacy Consult for zosyn Indication: small bowel enteritis  Allergies  Allergen Reactions  . Aspirin   . Feldene (Piroxicam) Swelling    Patient Measurements:   Body Weight: 89.4  Kg on 08/02/12  Vital Signs: Temp: 97.2 F (36.2 C) (03/09 1454) Temp src: Oral (03/09 1454) BP: 146/87 mmHg (03/09 1745) Pulse Rate: 86 (03/09 1830) Intake/Output from previous day:   Intake/Output from this shift:    Labs:  Recent Labs  02/03/13 1603  WBC 7.2  HGB 10.6*  PLT 277  CREATININE 6.94*   The CrCl is unknown because both a height and weight (above a minimum accepted value) are required for this calculation. No results found for this basename: VANCOTROUGH, VANCOPEAK, VANCORANDOM, GENTTROUGH, GENTPEAK, GENTRANDOM, TOBRATROUGH, TOBRAPEAK, TOBRARND, AMIKACINPEAK, AMIKACINTROU, AMIKACIN,  in the last 72 hours   Microbiology: No results found for this or any previous visit (from the past 720 hour(s)).  Medical History: Past Medical History  Diagnosis Date  . CAD (coronary artery disease)   . Hypertension   . Obesity   . Dyslipidemia   . Myocardial infarction 2007  . H/O allergic rhinitis   . Cancer     metastatic prostate cancer  . Anemia   . H/O: gout   . H/O hemorrhoids   . Pneumonia   . H/O: GI bleed   . Bone metastasis   . Diabetes mellitus   . Arthritis   . Rhinitis, allergic   . Carpal tunnel syndrome   . Renal insufficiency     ckd III  . Renal insufficiency   . Fatty liver   . Shoulder dislocation 1970    right    Medications: no abx PTA  Assessment: Mr. Mcgreal is a 78 yo man admitted with abdominal pain to start zosyn per pharmacy protocol for small bowel enteritis.  His admit creat is 6.94 = ARF. He has a hx of CKD stage III.  His WBC is 7.2 and he is afebrile.  His KUB shows small bowel thickening possibly enteritis.   Goal of Therapy: eradication of infection  Plan:  1. Zosyn 3.375 gm IV x 1 dose over 30  minutes in the ED then zosyn 2.25 gm IV q8h 2. F/u renal function and culture data Herby Abraham, Pharm.D. [phone] 02/03/2013 7:38 PM

## 2013-02-03 NOTE — ED Notes (Signed)
Per EMS-pt from home c/o abd pain due to constipation; pt sts no BM x 4 days with hx of same

## 2013-02-03 NOTE — ED Notes (Signed)
2nd RN attempted X 2, no success. IV has been paged.

## 2013-02-03 NOTE — ED Notes (Addendum)
Attempted to gain IV access X 2. Another RN will attempt.

## 2013-02-03 NOTE — ED Provider Notes (Signed)
History     CSN: [id number]  Arrival date & time 02/03/13  1200   First MD Initiated Contact with Patient 02/03/13 1501      Chief Complaint  Patient presents with  . Abdominal Pain  . Constipation    (Consider location/radiation/quality/duration/timing/severity/associated sxs/prior treatment) HPI  Past Medical History  Diagnosis Date  . CAD (coronary artery disease)   . Hypertension   . Obesity   . Dyslipidemia   . Myocardial infarction 2007  . H/O allergic rhinitis   . Cancer     metastatic prostate cancer  . Anemia   . H/O: gout   . H/O hemorrhoids   . Pneumonia   . H/O: GI bleed   . Bone metastasis   . Diabetes mellitus   . Arthritis   . Rhinitis, allergic   . Carpal tunnel syndrome   . Renal insufficiency     ckd III  . Renal insufficiency   . Fatty liver   . Shoulder dislocation 1970    right    Past Surgical History  Procedure Laterality Date  . Transurethral resection of prostate  06/08/2011, 11/08/06  . Right hand surgery  1974    Family History  Problem Relation Age of Onset  . Diabetes Mother     History  Substance Use Topics  . Smoking status: Former Games developer  . Smokeless tobacco: Never Used     Comment: QUIT "YEARS" AGO  . Alcohol Use: No     Comment: former heavy use      Review of Systems  Allergies  Aspirin and Feldene  Home Medications   Current Outpatient Rx  Name  Route  Sig  Dispense  Refill  . allopurinol (ZYLOPRIM) 100 MG tablet   Oral   Take 200 mg by mouth daily.         Marland Kitchen amLODipine (NORVASC) 5 MG tablet   Oral   Take 5 mg by mouth daily.         . colchicine 0.6 MG tablet   Oral   Take 1 tablet (0.6 mg total) by mouth daily.   30 tablet   0   . furosemide (LASIX) 20 MG tablet   Oral   Take 20 mg by mouth daily.         . hydrALAZINE (APRESOLINE) 25 MG tablet   Oral   Take 25 mg by mouth 3 (three) times daily.           . isosorbide mononitrate (IMDUR) 60 MG 24 hr tablet   Oral   Take 60  mg by mouth daily.           . metoprolol (LOPRESSOR) 50 MG tablet   Oral   Take 50 mg by mouth 2 (two) times daily.           . nitroGLYCERIN (NITROSTAT) 0.4 MG SL tablet   Sublingual   Place 0.4 mg under the tongue every 5 (five) minutes as needed.           . simvastatin (ZOCOR) 20 MG tablet   Oral   Take 20 mg by mouth at bedtime.           . sitaGLIPtin (JANUVIA) 25 MG tablet   Oral   Take 1 tablet (25 mg total) by mouth daily with breakfast.   30 tablet   0   . Tamsulosin HCl (FLOMAX) 0.4 MG CAPS   Oral   Take 0.4 mg by mouth daily.         Marland Kitchen  Vitamin D, Ergocalciferol, (DRISDOL) 50000 UNITS CAPS   Oral   Take 50,000 Units by mouth 2 (two) times a week.           BP 146/87  Pulse 86  Temp(Src) 97.2 F (36.2 C) (Oral)  Resp 20  SpO2 97%  Physical Exam  ED Course  Procedures (including critical care time)  Labs Reviewed  CBC WITH DIFFERENTIAL - Abnormal; Notable for the following:    RBC 3.97 (*)    Hemoglobin 10.6 (*)    HCT 30.5 (*)    MCV 76.8 (*)    RDW 17.0 (*)    Neutrophils Relative 87 (*)    Lymphocytes Relative 7 (*)    Lymphs Abs 0.5 (*)    All other components within normal limits  COMPREHENSIVE METABOLIC PANEL - Abnormal; Notable for the following:    Sodium 134 (*)    CO2 16 (*)    Glucose, Bld 282 (*)    BUN 55 (*)    Creatinine, Ser 6.94 (*)    Albumin 3.4 (*)    AST 72 (*)    Alkaline Phosphatase 1452 (*)    GFR calc non Af Amer 7 (*)    GFR calc Af Amer 8 (*)    All other components within normal limits  OCCULT BLOOD X 1 CARD TO LAB, STOOL  SODIUM, URINE, RANDOM  CREATININE, URINE, RANDOM  URINALYSIS, ROUTINE W REFLEX MICROSCOPIC  OCCULT BLOOD, POC DEVICE   Dg Abd Acute W/chest  02/03/2013  *RADIOLOGY REPORT*  Clinical Data: Chronic shortness of breath, constipation, abdominal pain  ACUTE ABDOMEN SERIES (ABDOMEN 2 VIEW & CHEST 1 VIEW)  Comparison: Chest radiographs dated 05/25/2012  Findings: Lungs are essentially  clear.  No focal consolidation. Small right pleural effusion.  Mild cardiomegaly.  Nonspecific bowel gas pattern with mucosal thickening involving multiple loops of small bowel in the mid abdomen.  The colon is not decompressed.  This appearance favors a small bowel enteritis and does not suggest small bowel obstruction.  No evidence of free air under the diaphragm on the upright view.  Degenerative changes of the visualized thoracolumbar spine.  IMPRESSION: Small right pleural effusion.  Mucosal thickening involving multiple small bowel loops, possibly reflecting small bowel enteritis.  No evidence of small bowel obstruction or free air.   Original Report Authenticated By: Charline Bills, M.D.      1. Acute renal failure       MDM   Patient with evidence of new acute renal failure with despite multiple sticks and IV starts with good flow an initial good saline intake all veins have infiltrated. Feel at this point patient needs a central line to get IV fluids. All the risk and benefits were explained to the patient and he does agree to get the central line. Patient is unable to get a PIC because the PICC team is not here tonight and given his new creatinine of 7 and feel that he needs hydration urgently.  CENTRAL LINE Performed by: Gwyneth Sprout Consent: The procedure was performed in an emergent situation. Required items: required blood products, implants, devices, and special equipment available Patient identity confirmed: arm band and provided demographic data Time out: Immediately prior to procedure a "time out" was called to verify the correct patient, procedure, equipment, support staff and site/side marked as required. Indications: vascular access Anesthesia: local infiltration Local anesthetic: lidocaine 1% with epinephrine Anesthetic total: 3 ml Patient sedated: no Preparation: skin prepped with 2% chlorhexidine Skin prep agent  dried: skin prep agent completely dried prior to  procedure Sterile barriers: all five maximum sterile barriers used - cap, mask, sterile gown, sterile gloves, and large sterile sheet Hand hygiene: hand hygiene performed prior to central venous catheter insertion  Location details: right IJ  Catheter type: triple lumen Catheter size: 8 Fr Pre-procedure: landmarks identified Ultrasound guidance: yes Successful placement: yes Post-procedure: line sutured and dressing applied Assessment: blood return through all parts, free fluid flow, placement verified by x-ray and no pneumothorax on x-ray Patient tolerance: Patient tolerated the procedure well with no immediate complications.  CRITICAL CARE Performed by: Gwyneth Sprout   Total critical care time: 45  Critical care time was exclusive of separately billable procedures and treating other patients.  Critical care was necessary to treat or prevent imminent or life-threatening deterioration.  Critical care was time spent personally by me on the following activities: development of treatment plan with patient and/or surrogate as well as nursing, discussions with consultants, evaluation of patient's response to treatment, examination of patient, obtaining history from patient or surrogate, ordering and performing treatments and interventions, ordering and review of laboratory studies, ordering and review of radiographic studies, pulse oximetry and re-evaluation of patient's condition.          Gwyneth Sprout, MD 02/06/13 [phone]

## 2013-02-03 NOTE — ED Provider Notes (Signed)
History     CSN: [id number]  Arrival date & time 02/03/13  1200   First MD Initiated Contact with Patient 02/03/13 1501      Chief Complaint  Patient presents with  . Abdominal Pain  . Constipation    (Consider location/radiation/quality/duration/timing/severity/associated sxs/prior treatment) HPI Comments: This is a 78 year old male, past medical history remarkable for hypertension, hyperlipidemia, diabetes, CAD, and metastatic prostate cancer, who presents emergency department with chief complaint of abdominal pain. Patient states that his pain began approximately 4 days ago. He states that he has had alternating constipation and diarrhea for the past 4 days. He denies fever, chills, chest pain, shortness of breath, nausea, and vomiting. He denies seeing any blood in stool. States that he feels bloated. Pain is 7/10, and does not radiate. He's not taking any blood thinners.  The history is provided by the patient. No language interpreter was used.    Past Medical History  Diagnosis Date  . CAD (coronary artery disease)   . Hypertension   . Obesity   . Dyslipidemia   . Myocardial infarction 2007  . H/O allergic rhinitis   . Cancer     metastatic prostate cancer  . Anemia   . H/O: gout   . H/O hemorrhoids   . Pneumonia   . H/O: GI bleed   . Bone metastasis   . Diabetes mellitus   . Arthritis   . Rhinitis, allergic   . Carpal tunnel syndrome   . Renal insufficiency     ckd III  . Renal insufficiency   . Fatty liver   . Shoulder dislocation 1970    right    Past Surgical History  Procedure Laterality Date  . Transurethral resection of prostate  06/08/2011, 11/08/06  . Right hand surgery  1974    Family History  Problem Relation Age of Onset  . Diabetes Mother     History  Substance Use Topics  . Smoking status: Former Games developer  . Smokeless tobacco: Never Used     Comment: QUIT "YEARS" AGO  . Alcohol Use: No     Comment: former heavy use      Review of  Systems  All other systems reviewed and are negative.    Allergies  Aspirin and Feldene  Home Medications   Current Outpatient Rx  Name  Route  Sig  Dispense  Refill  . allopurinol (ZYLOPRIM) 100 MG tablet   Oral   Take 200 mg by mouth daily.         Marland Kitchen amLODipine (NORVASC) 5 MG tablet   Oral   Take 5 mg by mouth daily.         . colchicine 0.6 MG tablet   Oral   Take 1 tablet (0.6 mg total) by mouth daily.   30 tablet   0   . furosemide (LASIX) 20 MG tablet   Oral   Take 20 mg by mouth daily.         . hydrALAZINE (APRESOLINE) 25 MG tablet   Oral   Take 25 mg by mouth 3 (three) times daily.           . isosorbide mononitrate (IMDUR) 60 MG 24 hr tablet   Oral   Take 60 mg by mouth daily.           . metoprolol (LOPRESSOR) 50 MG tablet   Oral   Take 50 mg by mouth 2 (two) times daily.           Marland Kitchen  nitroGLYCERIN (NITROSTAT) 0.4 MG SL tablet   Sublingual   Place 0.4 mg under the tongue every 5 (five) minutes as needed.           . simvastatin (ZOCOR) 20 MG tablet   Oral   Take 20 mg by mouth at bedtime.           . sitaGLIPtin (JANUVIA) 25 MG tablet   Oral   Take 1 tablet (25 mg total) by mouth daily with breakfast.   30 tablet   0   . Tamsulosin HCl (FLOMAX) 0.4 MG CAPS   Oral   Take 0.4 mg by mouth daily.         . Vitamin D, Ergocalciferol, (DRISDOL) 50000 UNITS CAPS   Oral   Take 50,000 Units by mouth 2 (two) times a week.           BP 145/85  Pulse 84  Temp(Src) 97.2 F (36.2 C) (Oral)  Resp 20  SpO2 99%  Physical Exam  Nursing note and vitals reviewed. Constitutional: He is oriented to person, place, and time. He appears well-developed and well-nourished.  jaundiced appearing  HENT:  Head: Normocephalic and atraumatic.  Eyes: Conjunctivae and EOM are normal. Pupils are equal, round, and reactive to light. Right eye exhibits no discharge. Left eye exhibits no discharge.  Neck: Normal range of motion. Neck supple. No  JVD present.  Cardiovascular: Normal rate, regular rhythm, normal heart sounds and intact distal pulses.  Exam reveals no gallop and no friction rub.   No murmur heard. Pulmonary/Chest: Effort normal and breath sounds normal. No respiratory distress. He has no wheezes. He has no rales. He exhibits no tenderness.  Abdominal: Soft. Bowel sounds are normal. He exhibits distension. He exhibits no mass. There is no tenderness. There is no rebound and no guarding.  No focal abdominal tenderness, no right lower quadrant tenderness or pain at McBurney's point, no right upper quadrant tenderness or Murphy sign, no left lower quadrant tenderness, no visible fluid wave  Genitourinary: Rectum normal. Guaiac negative stool.  Normal rectum, no internal or external hemorrhoids, no fissures or abscesses, soft brown stool in the rectal vault, no gross blood  Musculoskeletal: Normal range of motion. He exhibits no edema and no tenderness.  Neurological: He is alert and oriented to person, place, and time.  Skin: Skin is warm and dry.  Psychiatric: He has a normal mood and affect. His behavior is normal. Judgment and thought content normal.    ED Course  Procedures (including critical care time)  Labs Reviewed  CBC WITH DIFFERENTIAL  COMPREHENSIVE METABOLIC PANEL   Dg Abd Acute W/chest  02/03/2013  *RADIOLOGY REPORT*  Clinical Data: Chronic shortness of breath, constipation, abdominal pain  ACUTE ABDOMEN SERIES (ABDOMEN 2 VIEW & CHEST 1 VIEW)  Comparison: Chest radiographs dated 05/25/2012  Findings: Lungs are essentially clear.  No focal consolidation. Small right pleural effusion.  Mild cardiomegaly.  Nonspecific bowel gas pattern with mucosal thickening involving multiple loops of small bowel in the mid abdomen.  The colon is not decompressed.  This appearance favors a small bowel enteritis and does not suggest small bowel obstruction.  No evidence of free air under the diaphragm on the upright view.   Degenerative changes of the visualized thoracolumbar spine.  IMPRESSION: Small right pleural effusion.  Mucosal thickening involving multiple small bowel loops, possibly reflecting small bowel enteritis.  No evidence of small bowel obstruction or free air.   Original Report Authenticated By: Charline Bills, M.D.  ED ECG REPORT  I personally interpreted this EKG   Date: 02/03/2013   Rate: 86  Rhythm: normal sinus rhythm  QRS Axis: normal  Intervals: normal  ST/T Wave abnormalities: normal  Conduction Disutrbances:left bundle branch block  Narrative Interpretation:   Old EKG Reviewed: unchanged    1. Acute renal failure       MDM  78 year old male multiple health problems. Here with abdominal pain. Has been worsening for the past 4 days. Abdomen appears distended. Evidence for small bowel enteritis on plain films, will obtain CT abdomen for further evaluation. Will try routine labs, give IV pain medicines, and reassess.  4:00 PM Patient seen by and discussed with Dr. Anitra Lauth.  4:33 PM FOBT is negative.  Labs/CT still pending. Dr. Anitra Lauth to bedside to start IV.  5:11 PM Discussed labs with Dr. Anitra Lauth.  Patient is in acute renal failure, likely due to not eating and drinking because his power has been out.  Dr. Anitra Lauth tells me that as the patient does not have any focal abdominal tenderness, it would be appropriate to cancel the CT.  I will give the patient more fluids, and consult the hospitalist for admission.        Roxy Horseman, PA-C 02/03/13 2159

## 2013-02-04 DIAGNOSIS — N1330 Unspecified hydronephrosis: Secondary | ICD-10-CM | POA: Diagnosis present

## 2013-02-04 DIAGNOSIS — E119 Type 2 diabetes mellitus without complications: Secondary | ICD-10-CM

## 2013-02-04 DIAGNOSIS — C61 Malignant neoplasm of prostate: Secondary | ICD-10-CM | POA: Diagnosis present

## 2013-02-04 DIAGNOSIS — N179 Acute kidney failure, unspecified: Secondary | ICD-10-CM | POA: Diagnosis present

## 2013-02-04 LAB — GLUCOSE, CAPILLARY
Glucose-Capillary: 204 mg/dL — ABNORMAL HIGH (ref 70–99)
Glucose-Capillary: 246 mg/dL — ABNORMAL HIGH (ref 70–99)
Glucose-Capillary: 138 mg/dL — ABNORMAL HIGH (ref 70–99)

## 2013-02-04 LAB — BASIC METABOLIC PANEL
BUN: 54 mg/dL — ABNORMAL HIGH (ref 6–23)
Creatinine, Ser: 6.71 mg/dL — ABNORMAL HIGH (ref 0.50–1.35)
GFR calc Af Amer: 8 mL/min — ABNORMAL LOW (ref >90)
GFR calc non Af Amer: 7 mL/min — ABNORMAL LOW (ref >90)

## 2013-02-04 MED ORDER — LORAZEPAM 2 MG/ML IJ SOLN
INTRAMUSCULAR | Status: AC
  Filled 2013-02-04: qty 1

## 2013-02-04 MED ORDER — LORAZEPAM 2 MG/ML IJ SOLN
1.0000 mg | Freq: Once | INTRAMUSCULAR | Status: AC
  Administered 2013-02-04: 1 mg via INTRAVENOUS

## 2013-02-04 MED ORDER — LORAZEPAM 2 MG/ML IJ SOLN
1.0000 mg | Freq: Once | INTRAMUSCULAR | Status: AC
  Administered 2013-02-04: 1 mg via INTRAVENOUS
  Filled 2013-02-04: qty 1

## 2013-02-04 MED ORDER — INSULIN ASPART 100 UNIT/ML ~~LOC~~ SOLN
0.0000 [IU] | Freq: Three times a day (TID) | SUBCUTANEOUS | Status: DC
  Administered 2013-02-04: 3 [IU] via SUBCUTANEOUS
  Administered 2013-02-05: 1 [IU] via SUBCUTANEOUS
  Administered 2013-02-05 – 2013-02-06 (×2): 2 [IU] via SUBCUTANEOUS
  Administered 2013-02-06: 1 [IU] via SUBCUTANEOUS
  Administered 2013-02-07 – 2013-02-08 (×3): 2 [IU] via SUBCUTANEOUS
  Administered 2013-02-08 – 2013-02-09 (×2): 1 [IU] via SUBCUTANEOUS
  Administered 2013-02-10: 2 [IU] via SUBCUTANEOUS
  Administered 2013-02-10 – 2013-02-11 (×3): 1 [IU] via SUBCUTANEOUS
  Administered 2013-02-12: 2 [IU] via SUBCUTANEOUS
  Administered 2013-02-12 – 2013-02-13 (×2): 1 [IU] via SUBCUTANEOUS

## 2013-02-04 MED ORDER — INSULIN ASPART 100 UNIT/ML ~~LOC~~ SOLN: 0.0000 [IU] | Freq: Every day | SUBCUTANEOUS | Status: DC

## 2013-02-04 MED ORDER — FINASTERIDE 5 MG PO TABS
5.0000 mg | ORAL_TABLET | Freq: Every day | ORAL | Status: DC
  Administered 2013-02-04 – 2013-02-13 (×10): 5 mg via ORAL
  Filled 2013-02-04 (×10): qty 1

## 2013-02-04 NOTE — Progress Notes (Addendum)
Called by RN because patient was agitated and wanted to go home. Security called. Per RN, patient was fine until RN told him he needed to have a foley placed. Patient also go up to go to the bathroom and did not wait for anyone to help him and he fell. I went to see patient, and he was not conversational with me. Would not answer most of my questions. Will not place foley catheter tonight. REIDLER, RANDALL M, PA-C

## 2013-02-04 NOTE — Progress Notes (Signed)
Subjective: Patient reports he still feels bad. He complains mainly with joint pain.  Objective: Vital signs in last 24 hours: Temp:  [97.3 F (36.3 C)-98.2 F (36.8 C)] 97.3 F (36.3 C) (03/10 1415) Pulse Rate:  [84-89] 84 (03/10 1415) Resp:  [18-20] 18 (03/10 1415) BP: (130-152)/(75-89) 130/89 mmHg (03/10 1415) SpO2:  [93 %-100 %] 100 % (03/10 1415) Weight:  [89.359 kg (197 lb)] 89.359 kg (197 lb) (03/09 2235)  Intake/Output from previous day:   Intake/Output this shift:   he has very little urine output  Physical Exam:  Constitutional: Vital signs reviewed. He appears more cachectic than at his last outpatient visit with me.   Eyes: PERRL, No scleral icterus.   Cardiovascular: RRR Pulmonary/Chest: Normal effort Abdominal: Soft. Non-tender, non-distended, bowel sounds are normal, no masses, organomegaly, or guarding present.  Genitourinary: Urine dripping from meatus. Testicles are atrophic Extremities: No cyanosis or edema    With the patient's consent, and under sterile conditions, a 16 French Foley catheter was placed with some resistance at the bladder neck. A significant amount of brownish urine was obtained. Lab Results:  Recent Labs  02/03/13 1603  HGB 10.6*  HCT 30.5*   BMET  Recent Labs  02/03/13 1603 02/04/13 1329  NA 134* 135  K 5.1 5.0  CL 103 104  CO2 16* 15*  GLUCOSE 282* 242*  BUN 55* 54*  CREATININE 6.94* 6.71*  CALCIUM 9.1 8.6   No results found for this basename: LABPT, INR,  in the last 72 hours No results found for this basename: LABURIN,  in the last 72 hours Results for orders placed during the hospital encounter of 05/25/12  URINE CULTURE     Status: None   Collection Time    05/26/12  6:04 AM      Result Value Range Status   Specimen Description URINE, CLEAN CATCH   Final   Special Requests NONE   Final   Culture  Setup Time 05/26/2012 11:11   Final   Colony Count NO GROWTH   Final   Culture NO GROWTH   Final   Report  Status 05/27/2012 FINAL   Final    Studies/Results: Ct Abdomen Pelvis Wo Contrast  02/03/2013  *RADIOLOGY REPORT*  Clinical Data: Abdominal pain, history of prostate cancer and chronic kidney disease  CT ABDOMEN AND PELVIS WITHOUT CONTRAST  Technique:  Multidetector CT imaging of the abdomen and pelvis was performed following the standard protocol without intravenous contrast.  Comparison: 02/03/2013 radiographs  Findings: Degraded by streak artifact due to extremity positioning. There are small bilateral pleural effusions.  Associated airspace opacities; atelectasis versus infiltrate.  Coronary artery calcification.  Heart size upper normal.  Small hiatal hernia.  Organ abnormality/lesion detection is limited in the absence of intravenous contrast. Within this limitation, unremarkable liver, spleen, pancreas, adrenal glands.  No radiodense gallstones.  No biliary ductal dilatation.  Severe bilateral hydroureteronephrosis to the level of the UVJs.  Mild colonic diverticulosis.  No CT evidence for colitis or diverticulitis.  There is contrast within the right colon.  Small bowel loops are normal course and caliber.  No free intraperitoneal air or fluid.  Nonspecific retroperitoneal lymph nodes, measuring up to 12 mm short axis periaortic. Left external iliac chain lymph node measuring 10 mm short axis.  There is scattered atherosclerotic calcification of the aorta and its branches. No aneurysmal dilatation.  Distended bladder with bilateral diverticula.  Multilobulated filling defect along the bladder inferiorly.  Multiple sclerotic foci throughout the visualized  bones.  No displaced fracture identified.  IMPRESSION: No bowel obstruction. Unenhanced CT has limited sensitivity for bowel ischemia detection.  No secondary signs such as bowel wall pneumatosis or focal/segmental thickening.  Severe bilateral hydroureteronephrosis and bladder distension. Multiloculated filling defect along the bladder wall inferiorly.  Correlate with urinalysis/cystoscopy.  May reflect prostate/bladder carcinoma and/or blood clots.  Evidence of metastatic disease with scattered sclerotic foci and retroperitoneal lymphadenopathy.  Small bilateral pleural effusions with associated airspace opacities; atelectasis versus infiltrate.   Original Report Authenticated By: Jearld Lesch, M.D.    US Renal  02/03/2013  *RADIOLOGY REPORT*  Clinical Data: Acute renal failure.  History of prostate cancer.  RENAL/URINARY TRACT ULTRASOUND COMPLETE  Comparison:  None.  Findings:  Right Kidney:  Measures 11.1 cm.  There is marked hydronephrosis. No stone or mass identified.  Left Kidney:  Measures 11.3 cm.  There is severe hydronephrosis. No stone or mass identified.  Bladder:  A large mass lesion is identified within the urinary bladder.  IMPRESSION: Severe bilateral hydronephrosis likely due to a large mass lesion in the urinary bladder.   Original Report Authenticated By: Holley Dexter, M.D.    Dg Chest Port 1 View  02/03/2013  *RADIOLOGY REPORT*  Clinical Data: Line placement.  PORTABLE CHEST - 1 VIEW  Comparison: Single view of the chest earlier this same date.  Findings: New right IJ catheter is in place.  Tip of the catheter is just within the right atrium and the line should be withdrawn 3 cm for better positioning.  No pneumothorax identified.  There is cardiomegaly and vascular congestion.  IMPRESSION:  1.  Tip of right IJ catheter projects just in the right atrium. The line should be withdrawn approximately 3 cm for better positioning.  No pneumothorax. 2.  Cardiomegaly and vascular congestion.   Original Report Authenticated By: Holley Dexter, M.D.    Dg Abd Acute W/chest  02/03/2013  *RADIOLOGY REPORT*  Clinical Data: Chronic shortness of breath, constipation, abdominal pain  ACUTE ABDOMEN SERIES (ABDOMEN 2 VIEW & CHEST 1 VIEW)  Comparison: Chest radiographs dated 05/25/2012  Findings: Lungs are essentially clear.  No focal  consolidation. Small right pleural effusion.  Mild cardiomegaly.  Nonspecific bowel gas pattern with mucosal thickening involving multiple loops of small bowel in the mid abdomen.  The colon is not decompressed.  This appearance favors a small bowel enteritis and does not suggest small bowel obstruction.  No evidence of free air under the diaphragm on the upright view.  Degenerative changes of the visualized thoracolumbar spine.  IMPRESSION: Small right pleural effusion.  Mucosal thickening involving multiple small bowel loops, possibly reflecting small bowel enteritis.  No evidence of small bowel obstruction or free air.   Original Report Authenticated By: Charline Bills, M.D.     Assessment/Plan:   Advanced prostate cancer. This man's PSA was approximately 50 about a year ago. He has been noncompliant with followup. That I can see, he has not received androgen ablation therapy in almost a year. I see no recent PSA done.    Bladder outlet obstruction, due to his advanced prostate cancer. The bladder mass is almost certainly from his prostate.    Resultant hydronephrosis/renal insufficiency from bladder outlet obstruction. The patient, until now, has refused catheter placement. That was just performed.    This nice man is having a difficult time. First of all, I worry about his home situation. He cannot be sent home following this discharge. Secondly, he has been noncompliant with his followup.  I think during this hospitalization we should start him back on androgen deprivation. I have ordered a Lupron injection. Also, I think he should have followup bone scan. Hopefully, with his Foley catheter placement, he will have improvement in his renal function/hydronephrosis. We will need to follow his creatinine closely. If it does not respond appropriately, consideration should be given to bilateral nephrostomy tube placement. Obviously, he could not care for these by himself at home. Regarding his  bladder mass, I do not think further evaluation needs to be done. From this point out, I think we should consider his quality of life, with regards to palliation of his disease. It would be important to keep him off of dialysis, and perform simple medical management that will decrease his risk for secondary issues such as fractures.   LOS: 1 day   Marcine Matar M 02/04/2013, 7:17 PM

## 2013-02-04 NOTE — Progress Notes (Signed)
Patient ID: [name], male   DOB: 1935/08/18, 78 y.o.   MRN: [id number]   [facility] Progress Note    Subjective:   Reports no acute complaints-admits to having suprapubic distention/discomfort. Overnight events noted-he has refused a Foley catheter and was threatening to leave AGAINST MEDICAL ADVICE    Objective:   BP 152/86  Pulse 89  Temp(Src) 98.2 F (36.8 C) (Oral)  Resp 19  Ht 5\' 7"  (1.702 m)  Wt 89.359 kg (197 lb)  BMI 30.85 kg/m2  SpO2 93%  Intake/Output Summary (Last 24 hours) at 02/04/13 1150 Last data filed at 02/04/13 1100  Gross per 24 hour  Intake     60 ml  Output     20 ml  Net     40 ml   Weight change:   Physical Exam: Gen: Uncomfortably sitting up in recliner-attempting to urinate into a urinal (yielded about 10-20 cc of dark colored urine after about 5 minutes of struggle) CVS: Pulse regular in rate and rhythm, heart sounds S1 and S2 normal Resp: Clear to auscultation bilaterally, no rales/rhonchi Abd: Soft, obese, suprapubic tenderness with palpable bladder outline Ext: One plus edema  Imaging: Ct Abdomen Pelvis Wo Contrast  02/03/2013  *RADIOLOGY REPORT*  Clinical Data: Abdominal pain, history of prostate cancer and chronic kidney disease  CT ABDOMEN AND PELVIS WITHOUT CONTRAST  Technique:  Multidetector CT imaging of the abdomen and pelvis was performed following the standard protocol without intravenous contrast.  Comparison: 02/03/2013 radiographs  Findings: Degraded by streak artifact due to extremity positioning. There are small bilateral pleural effusions.  Associated airspace opacities; atelectasis versus infiltrate.  Coronary artery calcification.  Heart size upper normal.  Small hiatal hernia.  Organ abnormality/lesion detection is limited in the absence of intravenous contrast. Within this limitation, unremarkable liver, spleen, pancreas, adrenal glands.  No radiodense gallstones.  No biliary ductal dilatation.  Severe  bilateral hydroureteronephrosis to the level of the UVJs.  Mild colonic diverticulosis.  No CT evidence for colitis or diverticulitis.  There is contrast within the right colon.  Small bowel loops are normal course and caliber.  No free intraperitoneal air or fluid.  Nonspecific retroperitoneal lymph nodes, measuring up to 12 mm short axis periaortic. Left external iliac chain lymph node measuring 10 mm short axis.  There is scattered atherosclerotic calcification of the aorta and its branches. No aneurysmal dilatation.  Distended bladder with bilateral diverticula.  Multilobulated filling defect along the bladder inferiorly.  Multiple sclerotic foci throughout the visualized bones.  No displaced fracture identified.  IMPRESSION: No bowel obstruction. Unenhanced CT has limited sensitivity for bowel ischemia detection.  No secondary signs such as bowel wall pneumatosis or focal/segmental thickening.  Severe bilateral hydroureteronephrosis and bladder distension. Multiloculated filling defect along the bladder wall inferiorly. Correlate with urinalysis/cystoscopy.  May reflect prostate/bladder carcinoma and/or blood clots.  Evidence of metastatic disease with scattered sclerotic foci and retroperitoneal lymphadenopathy.  Small bilateral pleural effusions with associated airspace opacities; atelectasis versus infiltrate.   Original Report Authenticated By: Jearld Lesch, M.D.    US Renal  02/03/2013  *RADIOLOGY REPORT*  Clinical Data: Acute renal failure.  History of prostate cancer.  RENAL/URINARY TRACT ULTRASOUND COMPLETE  Comparison:  None.  Findings:  Right Kidney:  Measures 11.1 cm.  There is marked hydronephrosis. No stone or mass identified.  Left Kidney:  Measures 11.3 cm.  There is severe hydronephrosis. No stone or mass identified.  Bladder:  A large mass lesion is identified within the  urinary bladder.  IMPRESSION: Severe bilateral hydronephrosis likely due to a large mass lesion in the urinary bladder.    Original Report Authenticated By: Holley Dexter, M.D.    Dg Chest Port 1 View  02/03/2013  *RADIOLOGY REPORT*  Clinical Data: Line placement.  PORTABLE CHEST - 1 VIEW  Comparison: Single view of the chest earlier this same date.  Findings: New right IJ catheter is in place.  Tip of the catheter is just within the right atrium and the line should be withdrawn 3 cm for better positioning.  No pneumothorax identified.  There is cardiomegaly and vascular congestion.  IMPRESSION:  1.  Tip of right IJ catheter projects just in the right atrium. The line should be withdrawn approximately 3 cm for better positioning.  No pneumothorax. 2.  Cardiomegaly and vascular congestion.   Original Report Authenticated By: Holley Dexter, M.D.    Dg Abd Acute W/chest  02/03/2013  *RADIOLOGY REPORT*  Clinical Data: Chronic shortness of breath, constipation, abdominal pain  ACUTE ABDOMEN SERIES (ABDOMEN 2 VIEW & CHEST 1 VIEW)  Comparison: Chest radiographs dated 05/25/2012  Findings: Lungs are essentially clear.  No focal consolidation. Small right pleural effusion.  Mild cardiomegaly.  Nonspecific bowel gas pattern with mucosal thickening involving multiple loops of small bowel in the mid abdomen.  The colon is not decompressed.  This appearance favors a small bowel enteritis and does not suggest small bowel obstruction.  No evidence of free air under the diaphragm on the upright view.  Degenerative changes of the visualized thoracolumbar spine.  IMPRESSION: Small right pleural effusion.  Mucosal thickening involving multiple small bowel loops, possibly reflecting small bowel enteritis.  No evidence of small bowel obstruction or free air.   Original Report Authenticated By: Charline Bills, M.D.     Labs: BMET  Recent Labs Lab 02/03/13 1603  NA 134*  K 5.1  CL 103  CO2 16*  GLUCOSE 282*  BUN 55*  CREATININE 6.94*  CALCIUM 9.1   CBC  Recent Labs Lab 02/03/13 1603  WBC 7.2  NEUTROABS 6.3  HGB 10.6*   HCT 30.5*  MCV 76.8*  PLT 277    Medications:    . sodium chloride   Intravenous STAT  . amLODipine  5 mg Oral Daily  . enoxaparin (LOVENOX) injection  30 mg Subcutaneous Q24H  . finasteride  5 mg Oral Daily  . hydrALAZINE  25 mg Oral TID  . metoprolol  50 mg Oral BID  . piperacillin-tazobactam (ZOSYN)  IV  2.25 g Intravenous Q8H  . simvastatin  20 mg Oral QHS  . tamsulosin  0.4 mg Oral Daily     Assessment/ Plan:   1. Acute renal failure chronic kidney disease stage III: This is due to obstructive uropathy from what appears to be bladder versus prostatic cancer. Management comprises a Foley catheter which the patient has refused and continues to refuse in spite of extensive counseling. I have informed him that this has the potential of turning into the need for dialysis should he continue to decline Foley catheter placement-he states that his decision is unwavering. We'll await further assistance from urology into counseling versus consideration of percutaneous nephrostomies. 2. Hyponatremia/anion gap Metabolic acidosis/hyperkalemia: Secondary to acute renal failure and possibly distal RTA from obstruction. Encourage to get Foley catheter to help manage acute renal failure. 3. Hypertension: Fair control on current regime, continue to monitor.  Zetta Bills, MD 02/04/2013, 11:50 AM

## 2013-02-04 NOTE — Progress Notes (Signed)
Patient ID: [name]  male  ZOX:[id number]    DOB: [birth year]    DOA: 02/03/2013  PCP: Leo Grosser, MD  Assessment/Plan: Principal Problem:   Acute renal failure on CKD stage III with metabolic acidosis likely from bilateral hydronephrosis, evident on the renal ultrasound - Renal consult, urology consult has been obtained, patient needs a Foley catheter for decompression and further workup of bladder mass - I discussed in detail with the patient regarding the importance of the Foley catheter, patient absolutely refuses to have any Foley catheter placed. He understands the implication of worsening renal function, uremia. electrolyte abnormalities and needing urgent hemodialysis in that situation. - Overnight events noted the patient was extremely combative, wanted to leave AMA .    Active Problems:   HYPERLIPIDEMIA - on statin    Diabetes mellitus - Continue sliding scale insulin    Hydronephrosis, bilateral: Due to bladder mass and Prostate cancer -Urology following, will follow recommendations   DVT Prophylaxis:  Code Status:  Disposition:   Subjective: Eating, does not want any foley catheter, adamant despite multiple discussions   Objective: Weight change:   Intake/Output Summary (Last 24 hours) at 02/04/13 1555 Last data filed at 02/04/13 1413  Gross per 24 hour  Intake    200 ml  Output     25 ml  Net    175 ml   Blood pressure 130/89, pulse 84, temperature 97.3 F (36.3 C), temperature source Oral, resp. rate 18, height 5\' 7"  (1.702 m), weight 89.359 kg (197 lb), SpO2 100.00%.  Physical Exam: General: Alert and awake, oriented x3, not in any acute distress. CVS: S1-S2 clear, no murmur rubs or gallops Chest: clear to auscultation bilaterally, no wheezing, rales or rhonchi Abdomen: soft nontender, nondistended, normal bowel sounds, suprapubic tenderness  Extremities: no cyanosis, clubbing or edema noted bilaterally   Lab Results: Basic Metabolic  Panel:  Recent Labs Lab 02/03/13 1603 02/04/13 1329  NA 134* 135  K 5.1 5.0  CL 103 104  CO2 16* 15*  GLUCOSE 282* 242*  BUN 55* 54*  CREATININE 6.94* 6.71*  CALCIUM 9.1 8.6   Liver Function Tests:  Recent Labs Lab 02/03/13 1603  AST 72*  ALT 12  ALKPHOS 1452*  BILITOT 0.4  PROT 6.8  ALBUMIN 3.4*   No results found for this basename: LIPASE, AMYLASE,  in the last 168 hours No results found for this basename: AMMONIA,  in the last 168 hours CBC:  Recent Labs Lab 02/03/13 1603  WBC 7.2  NEUTROABS 6.3  HGB 10.6*  HCT 30.5*  MCV 76.8*  PLT 277   Cardiac Enzymes: No results found for this basename: CKTOTAL, CKMB, CKMBINDEX, TROPONINI,  in the last 168 hours BNP: No components found with this basename: POCBNP,  CBG:  Recent Labs Lab 02/04/13 0753  GLUCAP 204*     Micro Results: No results found for this or any previous visit (from the past 240 hour(s)).  Studies/Results: Ct Abdomen Pelvis Wo Contrast  02/03/2013  *RADIOLOGY REPORT*  Clinical Data: Abdominal pain, history of prostate cancer and chronic kidney disease  CT ABDOMEN AND PELVIS WITHOUT CONTRAST  Technique:  Multidetector CT imaging of the abdomen and pelvis was performed following the standard protocol without intravenous contrast.  Comparison: 02/03/2013 radiographs  Findings: Degraded by streak artifact due to extremity positioning. There are small bilateral pleural effusions.  Associated airspace opacities; atelectasis versus infiltrate.  Coronary artery calcification.  Heart size upper normal.  Small hiatal hernia.  Organ abnormality/lesion detection  is limited in the absence of intravenous contrast. Within this limitation, unremarkable liver, spleen, pancreas, adrenal glands.  No radiodense gallstones.  No biliary ductal dilatation.  Severe bilateral hydroureteronephrosis to the level of the UVJs.  Mild colonic diverticulosis.  No CT evidence for colitis or diverticulitis.  There is contrast within  the right colon.  Small bowel loops are normal course and caliber.  No free intraperitoneal air or fluid.  Nonspecific retroperitoneal lymph nodes, measuring up to 12 mm short axis periaortic. Left external iliac chain lymph node measuring 10 mm short axis.  There is scattered atherosclerotic calcification of the aorta and its branches. No aneurysmal dilatation.  Distended bladder with bilateral diverticula.  Multilobulated filling defect along the bladder inferiorly.  Multiple sclerotic foci throughout the visualized bones.  No displaced fracture identified.  IMPRESSION: No bowel obstruction. Unenhanced CT has limited sensitivity for bowel ischemia detection.  No secondary signs such as bowel wall pneumatosis or focal/segmental thickening.  Severe bilateral hydroureteronephrosis and bladder distension. Multiloculated filling defect along the bladder wall inferiorly. Correlate with urinalysis/cystoscopy.  May reflect prostate/bladder carcinoma and/or blood clots.  Evidence of metastatic disease with scattered sclerotic foci and retroperitoneal lymphadenopathy.  Small bilateral pleural effusions with associated airspace opacities; atelectasis versus infiltrate.   Original Report Authenticated By: Jearld Lesch, M.D.    US Renal  02/03/2013  *RADIOLOGY REPORT*  Clinical Data: Acute renal failure.  History of prostate cancer.  RENAL/URINARY TRACT ULTRASOUND COMPLETE  Comparison:  None.  Findings:  Right Kidney:  Measures 11.1 cm.  There is marked hydronephrosis. No stone or mass identified.  Left Kidney:  Measures 11.3 cm.  There is severe hydronephrosis. No stone or mass identified.  Bladder:  A large mass lesion is identified within the urinary bladder.  IMPRESSION: Severe bilateral hydronephrosis likely due to a large mass lesion in the urinary bladder.   Original Report Authenticated By: Holley Dexter, M.D.    Dg Chest Port 1 View  02/03/2013  *RADIOLOGY REPORT*  Clinical Data: Line placement.  PORTABLE  CHEST - 1 VIEW  Comparison: Single view of the chest earlier this same date.  Findings: New right IJ catheter is in place.  Tip of the catheter is just within the right atrium and the line should be withdrawn 3 cm for better positioning.  No pneumothorax identified.  There is cardiomegaly and vascular congestion.  IMPRESSION:  1.  Tip of right IJ catheter projects just in the right atrium. The line should be withdrawn approximately 3 cm for better positioning.  No pneumothorax. 2.  Cardiomegaly and vascular congestion.   Original Report Authenticated By: Holley Dexter, M.D.    Dg Abd Acute W/chest  02/03/2013  *RADIOLOGY REPORT*  Clinical Data: Chronic shortness of breath, constipation, abdominal pain  ACUTE ABDOMEN SERIES (ABDOMEN 2 VIEW & CHEST 1 VIEW)  Comparison: Chest radiographs dated 05/25/2012  Findings: Lungs are essentially clear.  No focal consolidation. Small right pleural effusion.  Mild cardiomegaly.  Nonspecific bowel gas pattern with mucosal thickening involving multiple loops of small bowel in the mid abdomen.  The colon is not decompressed.  This appearance favors a small bowel enteritis and does not suggest small bowel obstruction.  No evidence of free air under the diaphragm on the upright view.  Degenerative changes of the visualized thoracolumbar spine.  IMPRESSION: Small right pleural effusion.  Mucosal thickening involving multiple small bowel loops, possibly reflecting small bowel enteritis.  No evidence of small bowel obstruction or free air.   Original  Report Authenticated By: Charline Bills, M.D.     Medications: Scheduled Meds: . sodium chloride   Intravenous STAT  . amLODipine  5 mg Oral Daily  . enoxaparin (LOVENOX) injection  30 mg Subcutaneous Q24H  . finasteride  5 mg Oral Daily  . hydrALAZINE  25 mg Oral TID  . metoprolol  50 mg Oral BID  . piperacillin-tazobactam (ZOSYN)  IV  2.25 g Intravenous Q8H  . simvastatin  20 mg Oral QHS  . tamsulosin  0.4 mg Oral Daily       LOS: 1 day   RAI,RIPUDEEP M.D. [facility] 02/04/2013, 3:55 PM Pager: [phone]  If 7PM-7AM, please contact night-coverage [email or web address] Password [email or web address]

## 2013-02-04 NOTE — Progress Notes (Signed)
Inpatient Diabetes Program Recommendations  AACE/ADA: New Consensus Statement on Inpatient Glycemic Control (2013)  Target Ranges:  Prepandial:   less than 140 mg/dL      Peak postprandial:   less than 180 mg/dL (1-2 hours)      Critically ill patients:  140 - 180 mg/dL   Inpatient Diabetes Program Recommendations Correction (SSI): Glucose in 200's without eating.  May want to use a sensitive correction q4-6 hrs.  Thank you, Lenor Coffin, RN, CNS, Diabetes Coordinator [phone])

## 2013-02-04 NOTE — Progress Notes (Signed)
Patient fell last night around 2345. He said he was trying to get to the bathroom but was also trying to grab his key and wanting to leave AMA. Security was able to hold on to him and RN was in the room with him. He feel on his knees and did not sustain any other injuries. MD aware.  Verified order and ok with R. Reidler PA to run pt's IVF of NS at 150cc/H together with Soduim Bicarbonate in D5W @ 75cc/H. Patient tolerating well. Pt. Is calm and resting. Still refusing foley catheter at this time. Voided only approx. 50cc since 2300. Will continue to monitor. KYoung RN

## 2013-02-05 ENCOUNTER — Inpatient Hospital Stay (HOSPITAL_COMMUNITY): Payer: Medicare Other

## 2013-02-05 DIAGNOSIS — N1330 Unspecified hydronephrosis: Secondary | ICD-10-CM

## 2013-02-05 LAB — GLUCOSE, CAPILLARY
Glucose-Capillary: 176 mg/dL — ABNORMAL HIGH (ref 70–99)
Glucose-Capillary: 132 mg/dL — ABNORMAL HIGH (ref 70–99)
Glucose-Capillary: 143 mg/dL — ABNORMAL HIGH (ref 70–99)

## 2013-02-05 LAB — BASIC METABOLIC PANEL WITH GFR
BUN: 54 mg/dL — ABNORMAL HIGH (ref 6–23)
CO2: 20 meq/L (ref 19–32)
Calcium: 8.1 mg/dL — ABNORMAL LOW (ref 8.4–10.5)
Chloride: 103 meq/L (ref 96–112)
Creatinine, Ser: 6.69 mg/dL — ABNORMAL HIGH (ref 0.50–1.35)
GFR calc Af Amer: 8 mL/min — ABNORMAL LOW
GFR calc non Af Amer: 7 mL/min — ABNORMAL LOW
Glucose, Bld: 179 mg/dL — ABNORMAL HIGH (ref 70–99)
Potassium: 4.5 meq/L (ref 3.5–5.1)
Sodium: 137 meq/L (ref 135–145)

## 2013-02-05 LAB — URINE CULTURE
Colony Count: NO GROWTH
Culture: NO GROWTH
Special Requests: NORMAL

## 2013-02-05 LAB — HEMOGLOBIN A1C
Hgb A1c MFr Bld: 7.2 % — ABNORMAL HIGH
Mean Plasma Glucose: 160 mg/dL — ABNORMAL HIGH

## 2013-02-05 MED ORDER — TECHNETIUM TC 99M MEDRONATE IV KIT
25.0000 | PACK | Freq: Once | INTRAVENOUS | Status: AC | PRN
  Administered 2013-02-05: 25 via INTRAVENOUS

## 2013-02-05 NOTE — Progress Notes (Signed)
Patient ID: [name], male   DOB: 03-09-1935, 78 y.o.   MRN: [id number]   [facility] Progress Note    Subjective:   Foley catheter placed by Dr. Lynnae Sandhoff of urology yesterday resulting in about 1.6 L of urine output overnight. Currently somnolent but awakens and reports some arthralgias and Foley discomfort.    Objective:   BP 120/57  Pulse 80  Temp(Src) 99.8 F (37.7 C) (Oral)  Resp 20  Ht 5\' 7"  (1.702 m)  Wt 89.359 kg (197 lb)  BMI 30.85 kg/m2  SpO2 100%  Intake/Output Summary (Last 24 hours) at 02/05/13 0855 Last data filed at 02/05/13 0530  Gross per 24 hour  Intake   1329 ml  Output   1625 ml  Net   -296 ml   Weight change:   Physical Exam: Gen: Somnolent resting in bed, awakens with difficulty CVS: Pulse regular in rate and rhythm, heart sounds S1 and S2 normal Resp: Clear to auscultation bilaterally, no rales/rhonchi/retractions Abd: Soft, obese, nontender bowel sounds are normal Ext: Trace lower extremity edema over the ankles bilaterally  Imaging: Ct Abdomen Pelvis Wo Contrast  02/03/2013  *RADIOLOGY REPORT*  Clinical Data: Abdominal pain, history of prostate cancer and chronic kidney disease  CT ABDOMEN AND PELVIS WITHOUT CONTRAST  Technique:  Multidetector CT imaging of the abdomen and pelvis was performed following the standard protocol without intravenous contrast.  Comparison: 02/03/2013 radiographs  Findings: Degraded by streak artifact due to extremity positioning. There are small bilateral pleural effusions.  Associated airspace opacities; atelectasis versus infiltrate.  Coronary artery calcification.  Heart size upper normal.  Small hiatal hernia.  Organ abnormality/lesion detection is limited in the absence of intravenous contrast. Within this limitation, unremarkable liver, spleen, pancreas, adrenal glands.  No radiodense gallstones.  No biliary ductal dilatation.  Severe bilateral hydroureteronephrosis to the level of the UVJs.  Mild  colonic diverticulosis.  No CT evidence for colitis or diverticulitis.  There is contrast within the right colon.  Small bowel loops are normal course and caliber.  No free intraperitoneal air or fluid.  Nonspecific retroperitoneal lymph nodes, measuring up to 12 mm short axis periaortic. Left external iliac chain lymph node measuring 10 mm short axis.  There is scattered atherosclerotic calcification of the aorta and its branches. No aneurysmal dilatation.  Distended bladder with bilateral diverticula.  Multilobulated filling defect along the bladder inferiorly.  Multiple sclerotic foci throughout the visualized bones.  No displaced fracture identified.  IMPRESSION: No bowel obstruction. Unenhanced CT has limited sensitivity for bowel ischemia detection.  No secondary signs such as bowel wall pneumatosis or focal/segmental thickening.  Severe bilateral hydroureteronephrosis and bladder distension. Multiloculated filling defect along the bladder wall inferiorly. Correlate with urinalysis/cystoscopy.  May reflect prostate/bladder carcinoma and/or blood clots.  Evidence of metastatic disease with scattered sclerotic foci and retroperitoneal lymphadenopathy.  Small bilateral pleural effusions with associated airspace opacities; atelectasis versus infiltrate.   Original Report Authenticated By: Jearld Lesch, M.D.    US Renal  02/03/2013  *RADIOLOGY REPORT*  Clinical Data: Acute renal failure.  History of prostate cancer.  RENAL/URINARY TRACT ULTRASOUND COMPLETE  Comparison:  None.  Findings:  Right Kidney:  Measures 11.1 cm.  There is marked hydronephrosis. No stone or mass identified.  Left Kidney:  Measures 11.3 cm.  There is severe hydronephrosis. No stone or mass identified.  Bladder:  A large mass lesion is identified within the urinary bladder.  IMPRESSION: Severe bilateral hydronephrosis likely due to a large mass lesion  in the urinary bladder.   Original Report Authenticated By: Holley Dexter, M.D.     Dg Chest Port 1 View  02/03/2013  *RADIOLOGY REPORT*  Clinical Data: Line placement.  PORTABLE CHEST - 1 VIEW  Comparison: Single view of the chest earlier this same date.  Findings: New right IJ catheter is in place.  Tip of the catheter is just within the right atrium and the line should be withdrawn 3 cm for better positioning.  No pneumothorax identified.  There is cardiomegaly and vascular congestion.  IMPRESSION:  1.  Tip of right IJ catheter projects just in the right atrium. The line should be withdrawn approximately 3 cm for better positioning.  No pneumothorax. 2.  Cardiomegaly and vascular congestion.   Original Report Authenticated By: Holley Dexter, M.D.    Dg Abd Acute W/chest  02/03/2013  *RADIOLOGY REPORT*  Clinical Data: Chronic shortness of breath, constipation, abdominal pain  ACUTE ABDOMEN SERIES (ABDOMEN 2 VIEW & CHEST 1 VIEW)  Comparison: Chest radiographs dated 05/25/2012  Findings: Lungs are essentially clear.  No focal consolidation. Small right pleural effusion.  Mild cardiomegaly.  Nonspecific bowel gas pattern with mucosal thickening involving multiple loops of small bowel in the mid abdomen.  The colon is not decompressed.  This appearance favors a small bowel enteritis and does not suggest small bowel obstruction.  No evidence of free air under the diaphragm on the upright view.  Degenerative changes of the visualized thoracolumbar spine.  IMPRESSION: Small right pleural effusion.  Mucosal thickening involving multiple small bowel loops, possibly reflecting small bowel enteritis.  No evidence of small bowel obstruction or free air.   Original Report Authenticated By: Charline Bills, M.D.     Labs: BMET  Recent Labs Lab 02/03/13 1603 02/04/13 1329 02/05/13 0550  NA 134* 135 137  K 5.1 5.0 4.5  CL 103 104 103  CO2 16* 15* 20  GLUCOSE 282* 242* 179*  BUN 55* 54* 54*  CREATININE 6.94* 6.71* 6.69*  CALCIUM 9.1 8.6 8.1*   CBC  Recent Labs Lab 02/03/13 1603   WBC 7.2  NEUTROABS 6.3  HGB 10.6*  HCT 30.5*  MCV 76.8*  PLT 277    Medications:    . amLODipine  5 mg Oral Daily  . enoxaparin (LOVENOX) injection  30 mg Subcutaneous Q24H  . finasteride  5 mg Oral Daily  . hydrALAZINE  25 mg Oral TID  . insulin aspart  0-5 Units Subcutaneous QHS  . insulin aspart  0-9 Units Subcutaneous TID WC  . metoprolol  50 mg Oral BID  . piperacillin-tazobactam (ZOSYN)  IV  2.25 g Intravenous Q8H  . simvastatin  20 mg Oral QHS  . tamsulosin  0.4 mg Oral Daily     Assessment/ Plan:   1. Acute renal failure chronic kidney disease stage III: This is due to obstructive uropathy from what appears to be bladder versus prostatic cancer. Anticipate some improvement of renal function now with bladder outlet obstruction relief from Foley catheter. We'll continue to monitor urine output and reassess electrolytes closely. Urology notes reviewed-if renal function does not improve- consideration of percutaneous nephrostomies. Goals of care/management plan also delineated by urology to help this poorly compliant patient with a difficult social situation. 2. Hyponatremia/anion gap Metabolic acidosis/hyperkalemia: Secondary to acute renal failure and possibly distal RTA from obstruction. Electrolytes improving status post relief of obstruction-no acute need for intervention at this time. 3. Hypertension: Fair control on current regime, continue to monitor. 4. Locally metastatic prostate  cancer: This appears to be the likely diagnosis based on urology evaluation, androgen deprivation therapy noted.   Zetta Bills, MD 02/05/2013, 8:55 AM

## 2013-02-05 NOTE — Progress Notes (Signed)
Patient ID: [name]  male  ZOX:[id number]    DOB: October 07, 1935    DOA: 02/03/2013  PCP: Leo Grosser, MD  Assessment/Plan: Principal Problem:   Acute renal failure on CKD stage III with metabolic acidosis likely from bilateral hydronephrosis, evident on the renal ultrasound - Renal consult, urology consult has been obtained, Foley catheter placed by urology last night, appreciate assistance for decompression, output of 1.6 L overnight  - Hopefully patient's renal function will improve after Foley catheter, currently still at 6.69  Active Problems:   HYPERLIPIDEMIA - on statin    Diabetes mellitus - Continue sliding scale insulin    Hydronephrosis, bilateral: Due to bladder mass and Prostate cancer -Urology following, bone scan ordered by urology  DVT Prophylaxis:  Code Status:  Disposition: PT eval ordered   Subjective: Foley catheter placed, somewhat sleepy today however easily arousable and responds to questions  Objective: Weight change:   Intake/Output Summary (Last 24 hours) at 02/05/13 1313 Last data filed at 02/05/13 0800  Gross per 24 hour  Intake   1389 ml  Output   1605 ml  Net   -216 ml   Blood pressure 120/57, pulse 80, temperature 99.8 F (37.7 C), temperature source Oral, resp. rate 20, height 5\' 7"  (1.702 m), weight 89.359 kg (197 lb), SpO2 100.00%.  Physical Exam: General: Sleepy but easily arousable, no acute distress, Foley catheter placed  CVS: S1-S2 clear, no murmur rubs or gallops Chest: clear to auscultation bilaterally, no wheezing, rales or rhonchi Abdomen: soft nontender, nondistended, normal bowel sounds, suprapubic tenderness  Extremities: no cyanosis, clubbing. Trace edema noted bilaterally   Lab Results: Basic Metabolic Panel:  Recent Labs Lab 02/04/13 1329 02/05/13 0550  NA 135 137  K 5.0 4.5  CL 104 103  CO2 15* 20  GLUCOSE 242* 179*  BUN 54* 54*  CREATININE 6.71* 6.69*  CALCIUM 8.6 8.1*   Liver Function  Tests:  Recent Labs Lab 02/03/13 1603  AST 72*  ALT 12  ALKPHOS 1452*  BILITOT 0.4  PROT 6.8  ALBUMIN 3.4*   No results found for this basename: LIPASE, AMYLASE,  in the last 168 hours No results found for this basename: AMMONIA,  in the last 168 hours CBC:  Recent Labs Lab 02/03/13 1603  WBC 7.2  NEUTROABS 6.3  HGB 10.6*  HCT 30.5*  MCV 76.8*  PLT 277   Cardiac Enzymes: No results found for this basename: CKTOTAL, CKMB, CKMBINDEX, TROPONINI,  in the last 168 hours BNP: No components found with this basename: POCBNP,  CBG:  Recent Labs Lab 02/04/13 0753 02/04/13 1713 02/04/13 2210 02/05/13 0731 02/05/13 1117  GLUCAP 204* 246* 138* 176* 132*     Micro Results: No results found for this or any previous visit (from the past 240 hour(s)).  Studies/Results: Ct Abdomen Pelvis Wo Contrast  02/03/2013  *RADIOLOGY REPORT*  Clinical Data: Abdominal pain, history of prostate cancer and chronic kidney disease  CT ABDOMEN AND PELVIS WITHOUT CONTRAST  Technique:  Multidetector CT imaging of the abdomen and pelvis was performed following the standard protocol without intravenous contrast.  Comparison: 02/03/2013 radiographs  Findings: Degraded by streak artifact due to extremity positioning. There are small bilateral pleural effusions.  Associated airspace opacities; atelectasis versus infiltrate.  Coronary artery calcification.  Heart size upper normal.  Small hiatal hernia.  Organ abnormality/lesion detection is limited in the absence of intravenous contrast. Within this limitation, unremarkable liver, spleen, pancreas, adrenal glands.  No radiodense gallstones.  No biliary ductal dilatation.  Severe bilateral hydroureteronephrosis to the level of the UVJs.  Mild colonic diverticulosis.  No CT evidence for colitis or diverticulitis.  There is contrast within the right colon.  Small bowel loops are normal course and caliber.  No free intraperitoneal air or fluid.  Nonspecific  retroperitoneal lymph nodes, measuring up to 12 mm short axis periaortic. Left external iliac chain lymph node measuring 10 mm short axis.  There is scattered atherosclerotic calcification of the aorta and its branches. No aneurysmal dilatation.  Distended bladder with bilateral diverticula.  Multilobulated filling defect along the bladder inferiorly.  Multiple sclerotic foci throughout the visualized bones.  No displaced fracture identified.  IMPRESSION: No bowel obstruction. Unenhanced CT has limited sensitivity for bowel ischemia detection.  No secondary signs such as bowel wall pneumatosis or focal/segmental thickening.  Severe bilateral hydroureteronephrosis and bladder distension. Multiloculated filling defect along the bladder wall inferiorly. Correlate with urinalysis/cystoscopy.  May reflect prostate/bladder carcinoma and/or blood clots.  Evidence of metastatic disease with scattered sclerotic foci and retroperitoneal lymphadenopathy.  Small bilateral pleural effusions with associated airspace opacities; atelectasis versus infiltrate.   Original Report Authenticated By: Jearld Lesch, M.D.    US Renal  02/03/2013  *RADIOLOGY REPORT*  Clinical Data: Acute renal failure.  History of prostate cancer.  RENAL/URINARY TRACT ULTRASOUND COMPLETE  Comparison:  None.  Findings:  Right Kidney:  Measures 11.1 cm.  There is marked hydronephrosis. No stone or mass identified.  Left Kidney:  Measures 11.3 cm.  There is severe hydronephrosis. No stone or mass identified.  Bladder:  A large mass lesion is identified within the urinary bladder.  IMPRESSION: Severe bilateral hydronephrosis likely due to a large mass lesion in the urinary bladder.   Original Report Authenticated By: Holley Dexter, M.D.    Dg Chest Port 1 View  02/03/2013  *RADIOLOGY REPORT*  Clinical Data: Line placement.  PORTABLE CHEST - 1 VIEW  Comparison: Single view of the chest earlier this same date.  Findings: New right IJ catheter is in  place.  Tip of the catheter is just within the right atrium and the line should be withdrawn 3 cm for better positioning.  No pneumothorax identified.  There is cardiomegaly and vascular congestion.  IMPRESSION:  1.  Tip of right IJ catheter projects just in the right atrium. The line should be withdrawn approximately 3 cm for better positioning.  No pneumothorax. 2.  Cardiomegaly and vascular congestion.   Original Report Authenticated By: Holley Dexter, M.D.    Dg Abd Acute W/chest  02/03/2013  *RADIOLOGY REPORT*  Clinical Data: Chronic shortness of breath, constipation, abdominal pain  ACUTE ABDOMEN SERIES (ABDOMEN 2 VIEW & CHEST 1 VIEW)  Comparison: Chest radiographs dated 05/25/2012  Findings: Lungs are essentially clear.  No focal consolidation. Small right pleural effusion.  Mild cardiomegaly.  Nonspecific bowel gas pattern with mucosal thickening involving multiple loops of small bowel in the mid abdomen.  The colon is not decompressed.  This appearance favors a small bowel enteritis and does not suggest small bowel obstruction.  No evidence of free air under the diaphragm on the upright view.  Degenerative changes of the visualized thoracolumbar spine.  IMPRESSION: Small right pleural effusion.  Mucosal thickening involving multiple small bowel loops, possibly reflecting small bowel enteritis.  No evidence of small bowel obstruction or free air.   Original Report Authenticated By: Charline Bills, M.D.     Medications: Scheduled Meds: . amLODipine  5 mg Oral Daily  . enoxaparin (LOVENOX) injection  30  mg Subcutaneous Q24H  . finasteride  5 mg Oral Daily  . hydrALAZINE  25 mg Oral TID  . insulin aspart  0-5 Units Subcutaneous QHS  . insulin aspart  0-9 Units Subcutaneous TID WC  . metoprolol  50 mg Oral BID  . piperacillin-tazobactam (ZOSYN)  IV  2.25 g Intravenous Q8H  . simvastatin  20 mg Oral QHS  . tamsulosin  0.4 mg Oral Daily      LOS: 2 days   RAI,RIPUDEEP M.D. [facility] 02/05/2013, 1:13 PM Pager: [phone]  If 7PM-7AM, please contact night-coverage [email or web address] Password [email or web address]

## 2013-02-06 ENCOUNTER — Inpatient Hospital Stay (HOSPITAL_COMMUNITY): Payer: Medicare Other

## 2013-02-06 LAB — PSA: PSA: 145.7 ng/mL — ABNORMAL HIGH (ref <=4.00)

## 2013-02-06 LAB — BASIC METABOLIC PANEL
CO2: 23 mEq/L (ref 19–32)
Calcium: 7.9 mg/dL — ABNORMAL LOW (ref 8.4–10.5)
Chloride: 103 mEq/L (ref 96–112)
Glucose, Bld: 140 mg/dL — ABNORMAL HIGH (ref 70–99)
Potassium: 4.4 mEq/L (ref 3.5–5.1)
Sodium: 140 mEq/L (ref 135–145)

## 2013-02-06 LAB — GLUCOSE, CAPILLARY
Glucose-Capillary: 129 mg/dL — ABNORMAL HIGH (ref 70–99)
Glucose-Capillary: 173 mg/dL — ABNORMAL HIGH (ref 70–99)

## 2013-02-06 NOTE — Clinical Social Work Psychosocial (Signed)
     Clinical Social Work Department BRIEF PSYCHOSOCIAL ASSESSMENT 02/06/2013  Patient:  METRO, EDENFIELD     Account Number:  [id number]     Admit date:  02/03/2013  Clinical Social Worker:  Hulan Fray  Date/Time:  02/06/2013 11:23 AM  Referred by:  RN  Date Referred:  02/03/2013 Referred for  Other - See comment   Other Referral:   "Transportation needs"   Interview type:  Other - See comment Other interview type:   chart review    PSYCHOSOCIAL DATA Living Status:  ALONE Admitted from facility:   Level of care:   Primary support name:  Almeta Monas Primary support relationship to patient:  CHILD, ADULT Degree of support available:   contact in chart    CURRENT CONCERNS Current Concerns  Post-Acute Placement   Other Concerns:    SOCIAL WORK ASSESSMENT / PLAN Clinical Social Worker received referral for transportation needs. CSW reviewed chart and spoke with MD regarding disposition and is felt patient will need SNF placement at discharge.    CSW is awaiting PT evaluation. CSW called number in chart and left a voice message for daughter to return call.   Assessment/plan status:  Psychosocial Support/Ongoing Assessment of Needs Other assessment/ plan:   Information/referral to community resources:   Will provide SNF packet    PATIENTS/FAMILYS RESPONSE TO PLAN OF CARE: CSW is awaiting return call from patient's daughter.

## 2013-02-06 NOTE — Progress Notes (Signed)
Patient ID: [name]  male  WGN:[id number]    DOB: March 08, 1935    DOA: 02/03/2013  PCP: Leo Grosser, MD  Assessment/Plan: Principal Problem:   Acute renal failure on CKD stage III with metabolic acidosis likely from bilateral hydronephrosis, evident on the renal ultrasound: Improving now after Foley catheter placement - Appreciate renal and urology following - Creatinine function 5.9 today, no acute interventions   Active Problems:   HYPERLIPIDEMIA - on statin    Diabetes mellitus - Continue sliding scale insulin    Hydronephrosis, bilateral: Due to bladder mass and Prostate cancer - Urology following, mgmt per urology - Bone scan showed a significant interval progression of the bone metastasis with multifocal abnormal areas of increased uptake in axial and appendicular skeleton  DVT Prophylaxis:  Code Status:  Disposition: PT eval ordered, due to his prior history of noncompliance with his medical management. Now requiring more skilled care with Foley catheter, bone scan showing interval progression of the metastasis, skilled nursing facility supervision probably is a better choice than home or without family support.   Subjective: Patient denies any specific complaints at this time, eating his breakfast  Objective: Weight change:   Intake/Output Summary (Last 24 hours) at 02/06/13 1325 Last data filed at 02/06/13 1100  Gross per 24 hour  Intake 2207.5 ml  Output   3479 ml  Net -1271.5 ml   Blood pressure 109/58, pulse 98, temperature 98.8 F (37.1 C), temperature source Oral, resp. rate 18, height 5\' 7"  (1.702 m), weight 89.359 kg (197 lb), SpO2 100.00%.  Physical Exam: General: Sleepy but easily arousable, no acute distress, Foley catheter CVS: S1-S2 clear, no murmur rubs or gallops Chest: clear to auscultation bilaterally, no wheezing, rales or rhonchi Abdomen: soft nontender, nondistended, normal bowel sounds, suprapubic tenderness improved Extremities: no  cyanosis, clubbing. Trace edema noted bilaterally   Lab Results: Basic Metabolic Panel:  Recent Labs Lab 02/05/13 0550 02/06/13 0642  NA 137 140  K 4.5 4.4  CL 103 103  CO2 20 23  GLUCOSE 179* 140*  BUN 54* 54*  CREATININE 6.69* 5.99*  CALCIUM 8.1* 7.9*   Liver Function Tests:  Recent Labs Lab 02/03/13 1603  AST 72*  ALT 12  ALKPHOS 1452*  BILITOT 0.4  PROT 6.8  ALBUMIN 3.4*   No results found for this basename: LIPASE, AMYLASE,  in the last 168 hours No results found for this basename: AMMONIA,  in the last 168 hours CBC:  Recent Labs Lab 02/03/13 1603  WBC 7.2  NEUTROABS 6.3  HGB 10.6*  HCT 30.5*  MCV 76.8*  PLT 277   Cardiac Enzymes: No results found for this basename: CKTOTAL, CKMB, CKMBINDEX, TROPONINI,  in the last 168 hours BNP: No components found with this basename: POCBNP,  CBG:  Recent Labs Lab 02/05/13 1117 02/05/13 1641 02/05/13 2213 02/06/13 0730 02/06/13 1102  GLUCAP 132* 111* 143* 129* 173*     Micro Results: Recent Results (from the past 240 hour(s))  URINE CULTURE     Status: None   Collection Time    02/04/13  7:26 PM      Result Value Range Status   Specimen Description URINE, CATHETERIZED   Final   Special Requests Normal   Final   Culture  Setup Time 02/05/2013 03:04   Final   Colony Count NO GROWTH   Final   Culture NO GROWTH   Final   Report Status 02/05/2013 FINAL   Final    Studies/Results: Ct Abdomen Pelvis  Wo Contrast  02/03/2013  *RADIOLOGY REPORT*  Clinical Data: Abdominal pain, history of prostate cancer and chronic kidney disease  CT ABDOMEN AND PELVIS WITHOUT CONTRAST  Technique:  Multidetector CT imaging of the abdomen and pelvis was performed following the standard protocol without intravenous contrast.  Comparison: 02/03/2013 radiographs  Findings: Degraded by streak artifact due to extremity positioning. There are small bilateral pleural effusions.  Associated airspace opacities; atelectasis versus  infiltrate.  Coronary artery calcification.  Heart size upper normal.  Small hiatal hernia.  Organ abnormality/lesion detection is limited in the absence of intravenous contrast. Within this limitation, unremarkable liver, spleen, pancreas, adrenal glands.  No radiodense gallstones.  No biliary ductal dilatation.  Severe bilateral hydroureteronephrosis to the level of the UVJs.  Mild colonic diverticulosis.  No CT evidence for colitis or diverticulitis.  There is contrast within the right colon.  Small bowel loops are normal course and caliber.  No free intraperitoneal air or fluid.  Nonspecific retroperitoneal lymph nodes, measuring up to 12 mm short axis periaortic. Left external iliac chain lymph node measuring 10 mm short axis.  There is scattered atherosclerotic calcification of the aorta and its branches. No aneurysmal dilatation.  Distended bladder with bilateral diverticula.  Multilobulated filling defect along the bladder inferiorly.  Multiple sclerotic foci throughout the visualized bones.  No displaced fracture identified.  IMPRESSION: No bowel obstruction. Unenhanced CT has limited sensitivity for bowel ischemia detection.  No secondary signs such as bowel wall pneumatosis or focal/segmental thickening.  Severe bilateral hydroureteronephrosis and bladder distension. Multiloculated filling defect along the bladder wall inferiorly. Correlate with urinalysis/cystoscopy.  May reflect prostate/bladder carcinoma and/or blood clots.  Evidence of metastatic disease with scattered sclerotic foci and retroperitoneal lymphadenopathy.  Small bilateral pleural effusions with associated airspace opacities; atelectasis versus infiltrate.   Original Report Authenticated By: Jearld Lesch, M.D.    US Renal  02/03/2013  *RADIOLOGY REPORT*  Clinical Data: Acute renal failure.  History of prostate cancer.  RENAL/URINARY TRACT ULTRASOUND COMPLETE  Comparison:  None.  Findings:  Right Kidney:  Measures 11.1 cm.  There is  marked hydronephrosis. No stone or mass identified.  Left Kidney:  Measures 11.3 cm.  There is severe hydronephrosis. No stone or mass identified.  Bladder:  A large mass lesion is identified within the urinary bladder.  IMPRESSION: Severe bilateral hydronephrosis likely due to a large mass lesion in the urinary bladder.   Original Report Authenticated By: Holley Dexter, M.D.    Dg Chest Port 1 View  02/03/2013  *RADIOLOGY REPORT*  Clinical Data: Line placement.  PORTABLE CHEST - 1 VIEW  Comparison: Single view of the chest earlier this same date.  Findings: New right IJ catheter is in place.  Tip of the catheter is just within the right atrium and the line should be withdrawn 3 cm for better positioning.  No pneumothorax identified.  There is cardiomegaly and vascular congestion.  IMPRESSION:  1.  Tip of right IJ catheter projects just in the right atrium. The line should be withdrawn approximately 3 cm for better positioning.  No pneumothorax. 2.  Cardiomegaly and vascular congestion.   Original Report Authenticated By: Holley Dexter, M.D.    Dg Abd Acute W/chest  02/03/2013  *RADIOLOGY REPORT*  Clinical Data: Chronic shortness of breath, constipation, abdominal pain  ACUTE ABDOMEN SERIES (ABDOMEN 2 VIEW & CHEST 1 VIEW)  Comparison: Chest radiographs dated 05/25/2012  Findings: Lungs are essentially clear.  No focal consolidation. Small right pleural effusion.  Mild cardiomegaly.  Nonspecific bowel gas pattern with mucosal thickening involving multiple loops of small bowel in the mid abdomen.  The colon is not decompressed.  This appearance favors a small bowel enteritis and does not suggest small bowel obstruction.  No evidence of free air under the diaphragm on the upright view.  Degenerative changes of the visualized thoracolumbar spine.  IMPRESSION: Small right pleural effusion.  Mucosal thickening involving multiple small bowel loops, possibly reflecting small bowel enteritis.  No evidence of small  bowel obstruction or free air.   Original Report Authenticated By: Charline Bills, M.D.     Medications: Scheduled Meds: . amLODipine  5 mg Oral Daily  . enoxaparin (LOVENOX) injection  30 mg Subcutaneous Q24H  . finasteride  5 mg Oral Daily  . hydrALAZINE  25 mg Oral TID  . insulin aspart  0-5 Units Subcutaneous QHS  . insulin aspart  0-9 Units Subcutaneous TID WC  . metoprolol  50 mg Oral BID  . piperacillin-tazobactam (ZOSYN)  IV  2.25 g Intravenous Q8H  . simvastatin  20 mg Oral QHS  . tamsulosin  0.4 mg Oral Daily      LOS: 3 days   Darriana Deboy M.D. [facility] 02/06/2013, 1:25 PM Pager: [phone]  If 7PM-7AM, please contact night-coverage [email or web address] Password [email or web address]

## 2013-02-06 NOTE — Evaluation (Signed)
Physical Therapy Evaluation Patient Details Name: [name] MRN: [id number] DOB: Sep 09, 1935 Today's Date: 02/06/2013 Time: 4098-1191 PT Time Calculation (min): 18 min  PT Assessment / Plan / Recommendation Clinical Impression  Pt is a77 yo male with known history of medical non-compliance admitted for acute renal failure. Pt with noted generalized weakness, impaired balance, and decreased activity tolerance, as well as cognitive impairments/intermittent confusion. Pt unsafe to return home alone at this time and would benefit from SNF placement to address mentioned deficits.    PT Assessment  Patient needs continued PT services    Follow Up Recommendations  SNF;Supervision/Assistance - 24 hour    Does the patient have the potential to tolerate intense rehabilitation      Barriers to Discharge Decreased caregiver support      Equipment Recommendations   (TBD)    Recommendations for Other Services     Frequency Min 3X/week    Precautions / Restrictions Precautions Precautions: Fall Restrictions Weight Bearing Restrictions: No   Pertinent Vitals/Pain Pt reports of R shld pain with mvmt but did not rate      Mobility  Bed Mobility Bed Mobility: Right Sidelying to Sit;Sitting - Scoot to Edge of Bed Right Sidelying to Sit: 4: Min assist Sitting - Scoot to [place] of Bed: 4: Min guard Details for Bed Mobility Assistance: v/c's for task, strong use of bed rail, HOB elevated Transfers Transfers: Sit to Stand;Stand to Sit Sit to Stand: 4: Min assist;With upper extremity assist;From bed Stand to Sit: 4: Min guard;With upper extremity assist;To chair/3-in-1 Details for Transfer Assistance: increased time to achieve standing, pt with wide base of support and desire to hold onto something despite declining use of RW Ambulation/Gait Ambulation/Gait Assistance: 4: Min assist Ambulation Distance (Feet): 75 Feet Assistive device: 1 person hand held assist Ambulation/Gait Assistance  Details: pt mildly unsteady but no LOB due to pt holding onto PT. pt unsafe to ambulate without assist at this time. pt with 2 standing rest breaks Gait Pattern: Step-through pattern;Decreased stride length;Shuffle;Wide base of support Gait velocity: slow Stairs: No    Exercises     PT Diagnosis: Difficulty walking;Generalized weakness  PT Problem List: Decreased strength;Decreased activity tolerance;Decreased balance;Decreased mobility PT Treatment Interventions: Gait training;Functional mobility training;Therapeutic activities;Therapeutic exercise;Balance training   PT Goals Acute Rehab PT Goals PT Goal Formulation: Patient unable to participate in goal setting Time For Goal Achievement: 02/20/13 Potential to Achieve Goals: Fair Pt will go Supine/Side to Sit: with modified independence;with HOB 0 degrees PT Goal: Supine/Side to Sit - Progress: Goal set today Pt will go Sit to Stand: with modified independence;with upper extremity assist PT Goal: Sit to Stand - Progress: Goal set today Pt will Ambulate: >150 feet;with supervision;with least restrictive assistive device PT Goal: Ambulate - Progress: Goal set today Pt will Go Up / Down Stairs: 3-5 stairs;with rail(s);with supervision PT Goal: Up/Down Stairs - Progress: Goal set today  Visit Information  Last PT Received On: 02/06/13 Assistance Needed: +1    Subjective Data  Subjective: Pt received supine in bed with sitter present.   Prior Functioning  Home Living Lives With: Alone Available Help at Discharge:  (unsure - pt poor historian) Type of Home: House Home Access: Stairs to enter [employer] of Steps: 3 Entrance Stairs-Rails: Can reach both Home Layout: One level Bathroom Shower/Tub: Engineer, manufacturing systems: Standard Home Adaptive Equipment: Walker - rolling Additional Comments: per RN pt lives with daughter, per pt he lives alone - questionable history Prior Function Level of  Independence:  Independent Able to Take Stairs?: Yes Driving: Yes Vocation: Retired Musician: No difficulties Dominant Hand: Right    Cognition  Cognition Overall Cognitive Status: Impaired Area of Impairment: Attention;Memory;Following commands;Safety/judgement Arousal/Alertness: Awake/alert Orientation Level: Disoriented to;Time;Situation Behavior During Session: Agitated Current Attention Level: Sustained Attention - Other Comments: constant v/c's to complete task Memory Deficits: pt unable to recall date when asked Following Commands: Follows one step commands inconsistently (most likely due to inattention) Safety/Judgement: Decreased awareness of safety precautions;Decreased safety judgement for tasks assessed;Decreased awareness of need for assistance Cognition - Other Comments: pt with delayed response time and became agitated with PT during asking of history questions.    Extremity/Trunk Assessment Right Upper Extremity Assessment RUE ROM/Strength/Tone: Deficits RUE ROM/Strength/Tone Deficits: 45 deg R shld flex, limited by pain Left Upper Extremity Assessment LUE ROM/Strength/Tone: WFL for tasks assessed Right Lower Extremity Assessment RLE ROM/Strength/Tone: [facility] for tasks assessed Left Lower Extremity Assessment LLE ROM/Strength/Tone: WFL for tasks assessed Trunk Assessment Trunk Assessment: Normal   Balance Balance Balance Assessed: Yes Static Sitting Balance Static Sitting - Balance Support: Bilateral upper extremity supported;Feet supported Static Sitting - Level of Assistance: 5: Stand by assistance Static Sitting - Comment/# of Minutes: 2 min Static Standing Balance Static Standing - Balance Support: Right upper extremity supported Static Standing - Level of Assistance: 4: Min assist Static Standing - Comment/# of Minutes: 1 min  End of Session PT - End of Session Equipment Utilized During Treatment: Gait belt Activity Tolerance: Patient limited by  fatigue Patient left:  (pt with request to have BM, left on commode with sitter present to assist Nurse Communication: Mobility status  GP     Marcene Brawn 02/06/2013, 4:07 PM  Lewis Shock, PT, DPT Pager #: [phone] Office #: [phone]

## 2013-02-06 NOTE — Progress Notes (Signed)
ANTIBIOTIC CONSULT NOTE - Follow-up  Pharmacy Consult for zosyn Indication: small bowel enteritis  Allergies  Allergen Reactions  . Aspirin   . Feldene (Piroxicam) Swelling    Patient Measurements: Height: 5\' 7"  (170.2 cm) Weight: 197 lb (89.359 kg) IBW/kg (Calculated) : 66.1 Body Weight: 89.4  Kg on 08/02/12  Vital Signs: Temp: 98.8 F (37.1 C) (03/12 0611) Temp src: Oral (03/12 0611) BP: 115/93 mmHg (03/12 0611) Pulse Rate: 88 (03/12 0611) Intake/Output from previous day: 03/11 0701 - 03/12 0700 In: 2327.5 [P.O.:360; I.V.:1967.5] Out: 2377 [Urine:2375; Stool:2] Intake/Output from this shift:    Labs:  Recent Labs  02/03/13 1603 02/04/13 1329 02/05/13 0550  WBC 7.2  --   --   HGB 10.6*  --   --   PLT 277  --   --   CREATININE 6.94* 6.71* 6.69*   Estimated Creatinine Clearance: 10.2 ml/min (by C-G formula based on Cr of 6.69). No results found for this basename: VANCOTROUGH, Leodis Binet, VANCORANDOM, GENTTROUGH, GENTPEAK, GENTRANDOM, TOBRATROUGH, TOBRAPEAK, TOBRARND, AMIKACINPEAK, AMIKACINTROU, AMIKACIN,  in the last 72 hours   Microbiology: Recent Results (from the past 720 hour(s))  URINE CULTURE     Status: None   Collection Time    02/04/13  7:26 PM      Result Value Range Status   Specimen Description URINE, CATHETERIZED   Final   Special Requests Normal   Final   Culture  Setup Time 02/05/2013 03:04   Final   Colony Count NO GROWTH   Final   Culture NO GROWTH   Final   Report Status 02/05/2013 FINAL   Final   Assessment: [name] is a 78 yo man admitted with abdominal pain and was initiated on empiric zosyn for possible small bowel enteritis. His renal function remains poor but does have good UOP. He is currently afebrile and his last WBC on 3/9 was WNL. Urine culture is negative and no other cultures are available. Today is day #4 of therapy.   Goal of Therapy: Eradication of infection  Plan:  1. Continue zosyn as ordered 2. F/u renal fxn, C&S,  clinical status 3. MD - please address intended LOT and consider de-escalating therapy if appropriate  Lysle Pearl, PharmD, BCPS Pager # [phone] 02/06/2013 8:03 AM

## 2013-02-06 NOTE — Progress Notes (Signed)
Patient ID: [name], male   DOB: 01/21/1935, 78 y.o.   MRN: [id number]   [facility] Progress Note    Subjective:   Reports to be feeling better and denies any acute problems overnight   Objective:   BP 115/93  Pulse 88  Temp(Src) 98.8 F (37.1 C) (Oral)  Resp 18  Ht 5\' 7"  (1.702 m)  Wt 89.359 kg (197 lb)  BMI 30.85 kg/m2  SpO2 100%  Intake/Output Summary (Last 24 hours) at 02/06/13 7829 Last data filed at 02/06/13 0700  Gross per 24 hour  Intake 2207.5 ml  Output   2377 ml  Net -169.5 ml   Weight change:   Physical Exam: FAO:ZHYQMVHQION resting in bed- eating breakfast GEX:BMWUX RRR, normal S1 and S2  Resp:CTA bilaterally, no rales/rhonchi LKG:MWNU, obese, NT, BS normal UVO:ZDGUY ankle edema  Imaging: Nm Bone Scan Whole Body  02/05/2013  *RADIOLOGY REPORT*  Clinical Data: Follow up prostate cancer metastasis  NUCLEAR MEDICINE WHOLE BODY BONE SCINTIGRAPHY  Technique:  Whole body anterior and posterior images were obtained approximately 3 hours after intravenous injection of radiopharmaceutical.  Radiopharmaceutical: CURIE TC-MDP TECHNETIUM TC 1M MEDRONATE IV KIT  Comparison: 02/09/2012  Findings: Multifocal abnormal areas of increased radiotracer uptake are noted within the axial and appendicular skeleton consistent with prostate cancer metastasis. Compared with the previous exam there has been progression of prostate cancer metastasis.  The large lesion within the proximal right proximal humeral has increased in size compared with previous exam. There has also been increase in size of the proximal left femur lesion. Significant increase in size of sacral metastasis.  Increase in the number of the thoracic and rib metastasis.  There is faint physiologic tracer uptake noted within the kidneys and bladder.  IMPRESSION:  1.  Significant interval progression of the bone metastases.   Original Report Authenticated By: Signa Kell, M.D.      Labs: BMET  Recent Labs Lab 02/03/13 1603 02/04/13 1329 02/05/13 0550 02/06/13 0642  NA 134* 135 137 140  K 5.1 5.0 4.5 4.4  CL 103 104 103 103  CO2 16* 15* 20 23  GLUCOSE 282* 242* 179* 140*  BUN 55* 54* 54* 54*  CREATININE 6.94* 6.71* 6.69* 5.99*  CALCIUM 9.1 8.6 8.1* 7.9*   CBC  Recent Labs Lab 02/03/13 1603  WBC 7.2  NEUTROABS 6.3  HGB 10.6*  HCT 30.5*  MCV 76.8*  PLT 277    Medications:    . amLODipine  5 mg Oral Daily  . enoxaparin (LOVENOX) injection  30 mg Subcutaneous Q24H  . finasteride  5 mg Oral Daily  . hydrALAZINE  25 mg Oral TID  . insulin aspart  0-5 Units Subcutaneous QHS  . insulin aspart  0-9 Units Subcutaneous TID WC  . metoprolol  50 mg Oral BID  . piperacillin-tazobactam (ZOSYN)  IV  2.25 g Intravenous Q8H  . simvastatin  20 mg Oral QHS  . tamsulosin  0.4 mg Oral Daily     Assessment/ Plan:   1. Acute renal failure chronic kidney disease stage III: This is due to obstructive uropathy from what appears to be metastatic prostatic cancer. Renal function slightly better now with bladder outlet obstruction relief from Foley catheter- will repeat renal ultrasound today to help assess need for perc nephs. We'll continue to monitor urine output and reassess electrolytes closely. Urology notes reviewed. Goals of care/management plan also delineated by urology to help this poorly compliant patient with a difficult social situation.  2. Hyponatremia/anion gap Metabolic acidosis/hyperkalemia: Secondary to acute renal failure and possibly distal RTA from obstruction. Electrolytes improving status post relief of obstruction-no acute need for intervention at this time.  3. Hypertension: Fair control on current regime, continue to monitor.  4. Metastatic prostate cancer: PET scan report reviewed, androgen deprivation therapy planned.   Zetta Bills, MD 02/06/2013, 8:39 AM

## 2013-02-07 ENCOUNTER — Inpatient Hospital Stay (HOSPITAL_COMMUNITY): Payer: Medicare Other

## 2013-02-07 DIAGNOSIS — I10 Essential (primary) hypertension: Secondary | ICD-10-CM

## 2013-02-07 LAB — BASIC METABOLIC PANEL
BUN: 48 mg/dL — ABNORMAL HIGH (ref 6–23)
Chloride: 100 mEq/L (ref 96–112)
Creatinine, Ser: 5.27 mg/dL — ABNORMAL HIGH (ref 0.50–1.35)
GFR calc Af Amer: 11 mL/min — ABNORMAL LOW (ref >90)
Glucose, Bld: 181 mg/dL — ABNORMAL HIGH (ref 70–99)

## 2013-02-07 LAB — GLUCOSE, CAPILLARY
Glucose-Capillary: 107 mg/dL — ABNORMAL HIGH (ref 70–99)
Glucose-Capillary: 129 mg/dL — ABNORMAL HIGH (ref 70–99)

## 2013-02-07 MED ORDER — BICALUTAMIDE 50 MG PO TABS
50.0000 mg | ORAL_TABLET | Freq: Every day | ORAL | Status: DC
  Administered 2013-02-07 – 2013-02-13 (×7): 50 mg via ORAL
  Filled 2013-02-07 (×8): qty 1

## 2013-02-07 MED ORDER — LEUPROLIDE ACETATE 7.5 MG IM KIT
7.5000 mg | PACK | Freq: Once | INTRAMUSCULAR | Status: AC
  Administered 2013-02-07: 7.5 mg via INTRAMUSCULAR
  Filled 2013-02-07 (×2): qty 7.5

## 2013-02-07 MED ORDER — AMLODIPINE BESYLATE 10 MG PO TABS
10.0000 mg | ORAL_TABLET | Freq: Every day | ORAL | Status: DC
  Administered 2013-02-08 – 2013-02-13 (×6): 10 mg via ORAL
  Filled 2013-02-07 (×6): qty 1

## 2013-02-07 NOTE — Progress Notes (Signed)
Patient ID: [name]  male  ZOX:[id number]    DOB: 04/06/1935    DOA: 02/03/2013  PCP: Leo Grosser, MD  Assessment/Plan: Principal Problem:   Acute renal failure on CKD stage III with metabolic acidosis likely from bilateral hydronephrosis, evident on the renal ultrasound: Improving now after Foley catheter placement - Appreciate renal and urology following - Creatinine function improving slowly, no acute needs of hemodialysis - awaiting urology interventions  Active Problems:   HYPERLIPIDEMIA - on statin    Diabetes mellitus - Continue sliding scale insulin    Hydronephrosis, bilateral: Due to bladder mass and Prostate cancer - Urology following, mgmt per urology, awaiting further intervention  - Bone scan showed a significant interval progression of the bone metastasis with multifocal abnormal areas of increased uptake in axial and appendicular skeleton  ? Enteritis on abdominal x-ray on admission - I have discontinued Zosyn, patient is tolerating diet, no diarrhea or nausea, vomiting, repeat KUB for comparison  DVT Prophylaxis:  Code Status:  Disposition: SNF or 24 hr supervision   Subjective: Denies any specific complaints, making urine  Objective: Weight change:   Intake/Output Summary (Last 24 hours) at 02/07/13 1400 Last data filed at 02/07/13 0604  Gross per 24 hour  Intake   2060 ml  Output   1207 ml  Net    853 ml   Blood pressure 151/57, pulse 79, temperature 98.1 F (36.7 C), temperature source Oral, resp. rate 18, height 5\' 7"  (1.702 m), weight 89.359 kg (197 lb), SpO2 100.00%.  Physical Exam: General: NAD, alert and awake  CVS: S1-S2 clear, no murmur rubs or gallops Chest: CTAB Abdomen:  soft nontender,NBS Extremities: no c/c/e GU : foley+  Lab Results: Basic Metabolic Panel:  Recent Labs Lab 02/06/13 0642 02/07/13 0835  NA 140 140  K 4.4 3.9  CL 103 100  CO2 23 31  GLUCOSE 140* 181*  BUN 54* 48*  CREATININE 5.99* 5.27*   CALCIUM 7.9* 7.8*   Liver Function Tests:  Recent Labs Lab 02/03/13 1603  AST 72*  ALT 12  ALKPHOS 1452*  BILITOT 0.4  PROT 6.8  ALBUMIN 3.4*   No results found for this basename: LIPASE, AMYLASE,  in the last 168 hours No results found for this basename: AMMONIA,  in the last 168 hours CBC:  Recent Labs Lab 02/03/13 1603  WBC 7.2  NEUTROABS 6.3  HGB 10.6*  HCT 30.5*  MCV 76.8*  PLT 277   Cardiac Enzymes: No results found for this basename: CKTOTAL, CKMB, CKMBINDEX, TROPONINI,  in the last 168 hours BNP: No components found with this basename: POCBNP,  CBG:  Recent Labs Lab 02/06/13 1102 02/06/13 1626 02/06/13 2209 02/07/13 0751 02/07/13 1200  GLUCAP 173* 83 135* 152* 179*     Micro Results: Recent Results (from the past 240 hour(s))  URINE CULTURE     Status: None   Collection Time    02/04/13  7:26 PM      Result Value Range Status   Specimen Description URINE, CATHETERIZED   Final   Special Requests Normal   Final   Culture  Setup Time 02/05/2013 03:04   Final   Colony Count NO GROWTH   Final   Culture NO GROWTH   Final   Report Status 02/05/2013 FINAL   Final    Studies/Results: Ct Abdomen Pelvis Wo Contrast  02/03/2013  *RADIOLOGY REPORT*  Clinical Data: Abdominal pain, history of prostate cancer and chronic kidney disease  CT ABDOMEN AND PELVIS WITHOUT  CONTRAST  Technique:  Multidetector CT imaging of the abdomen and pelvis was performed following the standard protocol without intravenous contrast.  Comparison: 02/03/2013 radiographs  Findings: Degraded by streak artifact due to extremity positioning. There are small bilateral pleural effusions.  Associated airspace opacities; atelectasis versus infiltrate.  Coronary artery calcification.  Heart size upper normal.  Small hiatal hernia.  Organ abnormality/lesion detection is limited in the absence of intravenous contrast. Within this limitation, unremarkable liver, spleen, pancreas, adrenal glands.   No radiodense gallstones.  No biliary ductal dilatation.  Severe bilateral hydroureteronephrosis to the level of the UVJs.  Mild colonic diverticulosis.  No CT evidence for colitis or diverticulitis.  There is contrast within the right colon.  Small bowel loops are normal course and caliber.  No free intraperitoneal air or fluid.  Nonspecific retroperitoneal lymph nodes, measuring up to 12 mm short axis periaortic. Left external iliac chain lymph node measuring 10 mm short axis.  There is scattered atherosclerotic calcification of the aorta and its branches. No aneurysmal dilatation.  Distended bladder with bilateral diverticula.  Multilobulated filling defect along the bladder inferiorly.  Multiple sclerotic foci throughout the visualized bones.  No displaced fracture identified.  IMPRESSION: No bowel obstruction. Unenhanced CT has limited sensitivity for bowel ischemia detection.  No secondary signs such as bowel wall pneumatosis or focal/segmental thickening.  Severe bilateral hydroureteronephrosis and bladder distension. Multiloculated filling defect along the bladder wall inferiorly. Correlate with urinalysis/cystoscopy.  May reflect prostate/bladder carcinoma and/or blood clots.  Evidence of metastatic disease with scattered sclerotic foci and retroperitoneal lymphadenopathy.  Small bilateral pleural effusions with associated airspace opacities; atelectasis versus infiltrate.   Original Report Authenticated By: Jearld Lesch, M.D.    US Renal  02/03/2013  *RADIOLOGY REPORT*  Clinical Data: Acute renal failure.  History of prostate cancer.  RENAL/URINARY TRACT ULTRASOUND COMPLETE  Comparison:  None.  Findings:  Right Kidney:  Measures 11.1 cm.  There is marked hydronephrosis. No stone or mass identified.  Left Kidney:  Measures 11.3 cm.  There is severe hydronephrosis. No stone or mass identified.  Bladder:  A large mass lesion is identified within the urinary bladder.  IMPRESSION: Severe bilateral  hydronephrosis likely due to a large mass lesion in the urinary bladder.   Original Report Authenticated By: Holley Dexter, M.D.    Dg Chest Port 1 View  02/03/2013  *RADIOLOGY REPORT*  Clinical Data: Line placement.  PORTABLE CHEST - 1 VIEW  Comparison: Single view of the chest earlier this same date.  Findings: New right IJ catheter is in place.  Tip of the catheter is just within the right atrium and the line should be withdrawn 3 cm for better positioning.  No pneumothorax identified.  There is cardiomegaly and vascular congestion.  IMPRESSION:  1.  Tip of right IJ catheter projects just in the right atrium. The line should be withdrawn approximately 3 cm for better positioning.  No pneumothorax. 2.  Cardiomegaly and vascular congestion.   Original Report Authenticated By: Holley Dexter, M.D.    Dg Abd Acute W/chest  02/03/2013  *RADIOLOGY REPORT*  Clinical Data: Chronic shortness of breath, constipation, abdominal pain  ACUTE ABDOMEN SERIES (ABDOMEN 2 VIEW & CHEST 1 VIEW)  Comparison: Chest radiographs dated 05/25/2012  Findings: Lungs are essentially clear.  No focal consolidation. Small right pleural effusion.  Mild cardiomegaly.  Nonspecific bowel gas pattern with mucosal thickening involving multiple loops of small bowel in the mid abdomen.  The colon is not decompressed.  This appearance  favors a small bowel enteritis and does not suggest small bowel obstruction.  No evidence of free air under the diaphragm on the upright view.  Degenerative changes of the visualized thoracolumbar spine.  IMPRESSION: Small right pleural effusion.  Mucosal thickening involving multiple small bowel loops, possibly reflecting small bowel enteritis.  No evidence of small bowel obstruction or free air.   Original Report Authenticated By: Charline Bills, M.D.     Medications: Scheduled Meds: . [START ON 02/08/2013] amLODipine  10 mg Oral Daily  . enoxaparin (LOVENOX) injection  30 mg Subcutaneous Q24H  .  finasteride  5 mg Oral Daily  . hydrALAZINE  25 mg Oral TID  . insulin aspart  0-5 Units Subcutaneous QHS  . insulin aspart  0-9 Units Subcutaneous TID WC  . metoprolol  50 mg Oral BID  . simvastatin  20 mg Oral QHS  . tamsulosin  0.4 mg Oral Daily      LOS: 4 days   RAI,RIPUDEEP M.D. [facility] 02/07/2013, 2:00 PM Pager: [phone]  If 7PM-7AM, please contact night-coverage [email or web address] Password [email or web address]

## 2013-02-07 NOTE — Progress Notes (Signed)
Subjective: Patient reports that he is feeling a bit better. He still complains about right shoulder and bilateral leg pain. He has had no problems with the catheter since it was placed earlier this week.  Objective: Vital signs in last 24 hours: Temp:  [97.9 F (36.6 C)-98.2 F (36.8 C)] 98.2 F (36.8 C) (03/13 1433) Pulse Rate:  [73-86] 73 (03/13 1433) Resp:  [16-19] 16 (03/13 1433) BP: (112-151)/(57-69) 112/69 mmHg (03/13 1433) SpO2:  [99 %-100 %] 99 % (03/13 1433)  Intake/Output from previous day: 03/12 0701 - 03/13 0700 In: 2300 [P.O.:600; I.V.:1700] Out: 2309 [Urine:2300; Stool:9] Intake/Output this shift:    Physical Exam:  Constitutional: Vital signs reviewed. WD WN in NAD  . He appears more comfortable and more interactive. Eyes: PERRL, No scleral icterus.   Cardiovascular: RRR Pulmonary/Chest: Normal effort Abdominal: Soft. Non-tender, non-distended, bowel sounds are normal, no masses, organomegaly, or guarding present.  And  Lab Results: No results found for this basename: HGB, HCT,  in the last 72 hours BMET  Recent Labs  02/06/13 0642 02/07/13 0835  NA 140 140  K 4.4 3.9  CL 103 100  CO2 23 31  GLUCOSE 140* 181*  BUN 54* 48*  CREATININE 5.99* 5.27*  CALCIUM 7.9* 7.8*   No results found for this basename: LABPT, INR,  in the last 72 hours No results found for this basename: LABURIN,  in the last 72 hours Results for orders placed during the hospital encounter of 02/03/13  URINE CULTURE     Status: None   Collection Time    02/04/13  7:26 PM      Result Value Range Status   Specimen Description URINE, CATHETERIZED   Final   Special Requests Normal   Final   Culture  Setup Time 02/05/2013 03:04   Final   Colony Count NO GROWTH   Final   Culture NO GROWTH   Final   Report Status 02/05/2013 FINAL   Final    Studies/Results: US Renal  02/07/2013  *RADIOLOGY REPORT*  Clinical Data: Follow up bladder decompression  RENAL/URINARY TRACT ULTRASOUND  COMPLETE  Comparison:  02/03/2013  Findings:  Right Kidney:  Measures 10.6 cm.  Echogenic renal parenchyma, suggesting medical renal disease.  11 x 5 x 9 mm interpolar cyst. Mild hydronephrosis.  Left Kidney:  Measures 11.6 cm.  Echogenic renal parenchyma, suggesting medical renal disease.  Moderate hydronephrosis.  Bladder:  7.3 x 7.6 x 8.1 cm heterogeneous mass/debris within an otherwise decompressed bladder with indwelling Foley catheter.  IMPRESSION: Mild right and moderate left hydronephrosis.  Stable bladder mass/debris.  Indwelling Foley catheter.   Original Report Authenticated By: Charline Bills, M.D.    Dg Abd Portable 2v  02/07/2013  *RADIOLOGY REPORT*  Clinical Data: Enteritis  PORTABLE ABDOMEN - 2 VIEW  Comparison: CT 02/03/2013, KUB 02/03/2013  Findings: Mildly distended small bowel loops, similar to the prior study.  No air-fluid levels on the decubitus view.  There is gas in nondilated colon.  Air-fluid levels are not prominent on the decubitus view.  No free air.  IMPRESSION: No significant change.  Possible gastroenteritis.  Partial small bowel obstruction also possible.   Original Report Authenticated By: Janeece Riggers, M.D.     Assessment/Plan:   1. Progressive, metastatic adenocarcinoma prostate. He has been off of androgen deprivation for some time and there has been progression of bony metastatic disease with symptomatic right proximal humerus lesion and multiple other lesions.    2. Bilateral hydronephrosis with renal insufficiency.  This is some improved with placement of Foley catheter.      Plan: 1. At this point, I have started him back on Lupron as well as Casodex. Lupron causes a flare in testosterone, and he will need Casodex to block this.  2. Casodex should be continued at least 3 weeks at 50 mg a day  3. The patient should have a serum testosterone level drawn. Unfortunately, in the ordering system, I cannot find this to have it drawn. If another physician or  practitioner confined a serum testosterone within the orders sets, please have this drawn on routine basis within the next day or 2.  4. Continue Foley catheter and following creatinine for now. Hopefully, with the improved appearance of his kidneys, his creatinine will continue to decrease with just the Foley. If this alone does not help adequately, he will eventually need percutaneous nephrostomy tube placement  5. I have ordered a dietary consult to improve the patient's nutritional intake  6. I have also asked radiation oncology to see him regarding possible radiotherapy to his painful bony lesions  7. The patient would benefit from bone therapy i.e. Rivka Barbara. However, his oral hygiene/dental situation is so bad, I am afraid that any bisphosphonate or rank ligand inhibitor treatment would severely increases risk of osteonecrosis of the jaw. We will hold off for now on that  LOS: 4 days   Chelsea Aus 02/07/2013, 6:47 PM

## 2013-02-07 NOTE — Clinical Social Work Note (Signed)
Clinical Social Worker followed up with patient regarding PT's recommendation for SNF placement. Patient shrugged his shoulder's and stated "I don't know" and smiled. CSW inquired about other family that could be contacted and patient reported that he had two daughters, Annice Pih and [place]. Patient reported [name] lives in town. Patient was able to give daughter, Adriell Polansky contact information [phone]. Patient was agreeable for CSW to contact daughter, Kace Hartje.  CSW called daughter and she is agreeable for CSW to initiate SNF search in [place]. CSW left SNF packet in patient's room. CSW will update patient and daughter when bed offers are made.   Rozetta Nunnery MSW, [employer] [phone]

## 2013-02-07 NOTE — Progress Notes (Signed)
Physical Therapy Treatment Patient Details Name: [name] MRN: [id number] DOB: 1935/04/10 Today's Date: 02/07/2013 Time: 5621-3086 PT Time Calculation (min): 14 min  PT Assessment / Plan / Recommendation Comments on Treatment Session  Pt unwilling to participate in PT this session.  Pt indicated he did need to use bathroom.   Pt able to sit<>stand 2x's and ambulate 15 feet with supervision.  Pt become agitated with cues to improve technique & safety.    Follow Up Recommendations  SNF;Supervision/Assistance - 24 hour     Does the patient have the potential to tolerate intense rehabilitation     Barriers to Discharge        Equipment Recommendations       Recommendations for Other Services    Frequency Min 3X/week   Plan Discharge plan remains appropriate    Precautions / Restrictions Precautions Precautions: Fall Restrictions Weight Bearing Restrictions: No   Pertinent Vitals/Pain Pt said he was tired.    Mobility  Bed Mobility Bed Mobility: Supine to Sit;Sitting - Scoot to Edge of Bed;Sit to Supine Supine to Sit: 5: Supervision;With rails (Pt requires increased time.  ) Sitting - Scoot to Edge of Bed: 5: Supervision;With rail (uses UE's) Details for Bed Mobility Assistance: Pt needs extra time for task, becomes agitated with instructions for techninque & safety. Transfers Transfers: Sit to Stand;Stand to Sit Sit to Stand: 4: Min guard;With upper extremity assist;From bed;From chair/3-in-1 Stand to Sit: 4: Min guard;With upper extremity assist;To bed;To chair/3-in-1 Details for Transfer Assistance: Increased time to achieve, pt with wide BOS, pt refused HHA. Ambulation/Gait Ambulation/Gait Assistance: 4: Min guard (Pt refused HHA.) Ambulation Distance (Feet): 15 Feet Assistive device: None Ambulation/Gait Assistance Details: Pt has decreased step length & wide base of support.  Pt refused HHA "I can walk by myself". Gait Pattern: Step-through pattern;Decreased  step length - right;Decreased step length - left;Wide base of support Gait velocity: slow Stairs: No    Exercises     PT Diagnosis:    PT Problem List:   PT Treatment Interventions:     PT Goals Acute Rehab PT Goals Potential to Achieve Goals: Fair Pt will go Supine/Side to Sit: with modified independence;with HOB 0 degrees PT Goal: Supine/Side to Sit - Progress: Progressing toward goal Pt will go Sit to Stand: with modified independence;with upper extremity assist PT Goal: Sit to Stand - Progress: Progressing toward goal Pt will Ambulate: >150 feet;with supervision;with least restrictive assistive device PT Goal: Ambulate - Progress: Progressing toward goal  Visit Information  Last PT Received On: 02/07/13 Assistance Needed: +1    Subjective Data  Subjective: Pt received supine in bed with sitter present.  Too tired to walk, would like to use bathroom.   Cognition  Cognition Overall Cognitive Status: Impaired Area of Impairment: Attention;Memory;Following commands;Safety/judgement Arousal/Alertness: Awake/alert Orientation Level: Disoriented to;Time;Situation Behavior During Session: Agitated Cognition - Other Comments: Pt become agitated with instructions for proper technique.    Balance     End of Session PT - End of Session Equipment Utilized During Treatment: Gait belt Activity Tolerance: Patient limited by fatigue Patient left: in bed;with call bell/phone within reach;Other (comment) (Minder in room) Nurse Communication: Mobility status   GP     Enid Baas, SPTA 02/07/2013, 2:47 PM

## 2013-02-07 NOTE — Progress Notes (Signed)
Patient ID: [name], male   DOB: 12-19-1934, 78 y.o.   MRN: [id number]   [facility] Progress Note    Subjective:   Reports to be feeling fair with poor sleep last night.   Objective:   BP 151/57  Pulse 79  Temp(Src) 98.1 F (36.7 C) (Oral)  Resp 18  Ht 5\' 7"  (1.702 m)  Wt 89.359 kg (197 lb)  BMI 30.85 kg/m2  SpO2 100%  Intake/Output Summary (Last 24 hours) at 02/07/13 1056 Last data filed at 02/07/13 0604  Gross per 24 hour  Intake   2180 ml  Output   2309 ml  Net   -129 ml   Weight change:   Physical Exam: JXB:[id number] resting in bed- sitter at bedside YQM:VHQIO RRR, normal S1 and S2  Resp:CTA bilaterally, no rales/rhonchi NGE:XBMW, flat, NT, BS normal Ext:No LE edema  Imaging: Nm Bone Scan Whole Body  02/05/2013  *RADIOLOGY REPORT*  Clinical Data: Follow up prostate cancer metastasis  NUCLEAR MEDICINE WHOLE BODY BONE SCINTIGRAPHY  Technique:  Whole body anterior and posterior images were obtained approximately 3 hours after intravenous injection of radiopharmaceutical.  Radiopharmaceutical: CURIE TC-MDP TECHNETIUM TC 34M MEDRONATE IV KIT  Comparison: 02/09/2012  Findings: Multifocal abnormal areas of increased radiotracer uptake are noted within the axial and appendicular skeleton consistent with prostate cancer metastasis. Compared with the previous exam there has been progression of prostate cancer metastasis.  The large lesion within the proximal right proximal humeral has increased in size compared with previous exam. There has also been increase in size of the proximal left femur lesion. Significant increase in size of sacral metastasis.  Increase in the number of the thoracic and rib metastasis.  There is faint physiologic tracer uptake noted within the kidneys and bladder.  IMPRESSION:  1.  Significant interval progression of the bone metastases.   Original Report Authenticated By: Signa Kell, M.D.    US Renal  02/07/2013   *RADIOLOGY REPORT*  Clinical Data: Follow up bladder decompression  RENAL/URINARY TRACT ULTRASOUND COMPLETE  Comparison:  02/03/2013  Findings:  Right Kidney:  Measures 10.6 cm.  Echogenic renal parenchyma, suggesting medical renal disease.  11 x 5 x 9 mm interpolar cyst. Mild hydronephrosis.  Left Kidney:  Measures 11.6 cm.  Echogenic renal parenchyma, suggesting medical renal disease.  Moderate hydronephrosis.  Bladder:  7.3 x 7.6 x 8.1 cm heterogeneous mass/debris within an otherwise decompressed bladder with indwelling Foley catheter.  IMPRESSION: Mild right and moderate left hydronephrosis.  Stable bladder mass/debris.  Indwelling Foley catheter.   Original Report Authenticated By: Charline Bills, M.D.     Labs: BMET  Recent Labs Lab 02/03/13 1603 02/04/13 1329 02/05/13 0550 02/06/13 0642 02/07/13 0835  NA 134* 135 137 140 140  K 5.1 5.0 4.5 4.4 3.9  CL 103 104 103 103 100  CO2 16* 15* 20 23 31   GLUCOSE 282* 242* 179* 140* 181*  BUN 55* 54* 54* 54* 48*  CREATININE 6.94* 6.71* 6.69* 5.99* 5.27*  CALCIUM 9.1 8.6 8.1* 7.9* 7.8*   CBC  Recent Labs Lab 02/03/13 1603  WBC 7.2  NEUTROABS 6.3  HGB 10.6*  HCT 30.5*  MCV 76.8*  PLT 277    Medications:    . amLODipine  5 mg Oral Daily  . enoxaparin (LOVENOX) injection  30 mg Subcutaneous Q24H  . finasteride  5 mg Oral Daily  . hydrALAZINE  25 mg Oral TID  . insulin aspart  0-5 Units Subcutaneous QHS  . insulin  aspart  0-9 Units Subcutaneous TID WC  . metoprolol  50 mg Oral BID  . piperacillin-tazobactam (ZOSYN)  IV  2.25 g Intravenous Q8H  . simvastatin  20 mg Oral QHS  . tamsulosin  0.4 mg Oral Daily     Assessment/ Plan:    1. Acute renal failure chronic kidney disease stage III: This is due to obstructive uropathy from what appears to be metastatic prostatic cancer. Renal function continues to improve with foley in place and UOP is fair. NO acute HD needs noted. Urology notes reviewed. Goals of care/management plan  also delineated by urology to help this poorly compliant patient with a difficult social situation. Renal US shows improvement in hydronephrosis- await further decision by urology on need for interventions. 2. Hyponatremia/anion gap Metabolic acidosis/hyperkalemia: Secondary to acute renal failure and possibly distal RTA from obstruction. Electrolytes improved status post relief of obstruction.  3. Hypertension: Fair control on current regime, increase amlodipine to 10mg  qday.  4. Metastatic prostate cancer: PET scan report reviewed, androgen deprivation therapy planned.  Zetta Bills, MD 02/07/2013, 10:56 AM

## 2013-02-08 ENCOUNTER — Ambulatory Visit
Admit: 2013-02-08 | Discharge: 2013-02-08 | Disposition: A | Discharge status: Home / Self Care | Payer: Medicare Other | Attending: Radiation Oncology | Admitting: Radiation Oncology

## 2013-02-08 DIAGNOSIS — C801 Malignant (primary) neoplasm, unspecified: Secondary | ICD-10-CM | POA: Insufficient documentation

## 2013-02-08 DIAGNOSIS — Z510 Encounter for antineoplastic radiation therapy: Secondary | ICD-10-CM | POA: Insufficient documentation

## 2013-02-08 DIAGNOSIS — C7951 Secondary malignant neoplasm of bone: Secondary | ICD-10-CM

## 2013-02-08 DIAGNOSIS — C61 Malignant neoplasm of prostate: Secondary | ICD-10-CM | POA: Insufficient documentation

## 2013-02-08 DIAGNOSIS — Z79899 Other long term (current) drug therapy: Secondary | ICD-10-CM | POA: Insufficient documentation

## 2013-02-08 LAB — RENAL FUNCTION PANEL
Albumin: 2.3 g/dL — ABNORMAL LOW (ref 3.5–5.2)
BUN: 45 mg/dL — ABNORMAL HIGH (ref 6–23)
Calcium: 7.8 mg/dL — ABNORMAL LOW (ref 8.4–10.5)
Creatinine, Ser: 4.77 mg/dL — ABNORMAL HIGH (ref 0.50–1.35)
GFR calc non Af Amer: 11 mL/min — ABNORMAL LOW (ref >90)
Phosphorus: 3.3 mg/dL (ref 2.3–4.6)

## 2013-02-08 LAB — PROTIME-INR
INR: 1.06 (ref 0.00–1.49)
Prothrombin Time: 13.7 seconds (ref 11.6–15.2)

## 2013-02-08 LAB — CBC
Hemoglobin: 8.6 g/dL — ABNORMAL LOW (ref 13.0–17.0)
MCH: 26.8 pg (ref 26.0–34.0)
MCHC: 35 g/dL (ref 30.0–36.0)
Platelets: 201 10*3/uL (ref 150–400)
RDW: 16.1 % — ABNORMAL HIGH (ref 11.5–15.5)

## 2013-02-08 LAB — MAGNESIUM: Magnesium: 1.3 mg/dL — ABNORMAL LOW (ref 1.5–2.5)

## 2013-02-08 LAB — TESTOSTERONE: Testosterone: 146 ng/dL — ABNORMAL LOW (ref 300–890)

## 2013-02-08 MED ORDER — METRONIDAZOLE 500 MG PO TABS
500.0000 mg | ORAL_TABLET | Freq: Three times a day (TID) | ORAL | Status: DC
  Administered 2013-02-08 – 2013-02-12 (×13): 500 mg via ORAL
  Filled 2013-02-08 (×20): qty 1

## 2013-02-08 MED ORDER — OXYCODONE HCL 5 MG PO TABS
5.0000 mg | ORAL_TABLET | Freq: Four times a day (QID) | ORAL | Status: DC | PRN
  Administered 2013-02-10 – 2013-02-13 (×5): 5 mg via ORAL
  Filled 2013-02-08 (×5): qty 1

## 2013-02-08 MED ORDER — MAGNESIUM OXIDE 400 (241.3 MG) MG PO TABS
400.0000 mg | ORAL_TABLET | Freq: Two times a day (BID) | ORAL | Status: DC
  Administered 2013-02-08 – 2013-02-13 (×11): 400 mg via ORAL
  Filled 2013-02-08 (×14): qty 1

## 2013-02-08 NOTE — Progress Notes (Signed)
Patient ID: [name], male   DOB: July 25, 1935, 78 y.o.   MRN: [id number]   [facility] Progress Note    Subjective:   Reports to be feeling fair- awaiting SNF placement   Objective:   BP 134/72  Pulse 87  Temp(Src) 97.5 F (36.4 C) (Oral)  Resp 22  Ht 5\' 7"  (1.702 m)  Wt 89.359 kg (197 lb)  BMI 30.85 kg/m2  SpO2 100%  Intake/Output Summary (Last 24 hours) at 02/08/13 0959 Last data filed at 02/08/13 0849  Gross per 24 hour  Intake   1230 ml  Output   3202 ml  Net  -1972 ml   Weight change:   Physical Exam: WUJ:[id number] resting in bed AOZ:HYQMV RRR, normal S1 and S2 Resp: clear to auscultation bilaterally, no rales/rhonchi HQI:ONGE, obese, NT, BS normal Ext:No LE edema  Imaging: US Renal  02/07/2013  *RADIOLOGY REPORT*  Clinical Data: Follow up bladder decompression  RENAL/URINARY TRACT ULTRASOUND COMPLETE  Comparison:  02/03/2013  Findings:  Right Kidney:  Measures 10.6 cm.  Echogenic renal parenchyma, suggesting medical renal disease.  11 x 5 x 9 mm interpolar cyst. Mild hydronephrosis.  Left Kidney:  Measures 11.6 cm.  Echogenic renal parenchyma, suggesting medical renal disease.  Moderate hydronephrosis.  Bladder:  7.3 x 7.6 x 8.1 cm heterogeneous mass/debris within an otherwise decompressed bladder with indwelling Foley catheter.  IMPRESSION: Mild right and moderate left hydronephrosis.  Stable bladder mass/debris.  Indwelling Foley catheter.   Original Report Authenticated By: Charline Bills, M.D.    Dg Abd Portable 2v  02/07/2013  *RADIOLOGY REPORT*  Clinical Data: Enteritis  PORTABLE ABDOMEN - 2 VIEW  Comparison: CT 02/03/2013, KUB 02/03/2013  Findings: Mildly distended small bowel loops, similar to the prior study.  No air-fluid levels on the decubitus view.  There is gas in nondilated colon.  Air-fluid levels are not prominent on the decubitus view.  No free air.  IMPRESSION: No significant change.  Possible gastroenteritis.  Partial small  bowel obstruction also possible.   Original Report Authenticated By: Janeece Riggers, M.D.     Labs: BMET  Recent Labs Lab 02/03/13 1603 02/04/13 1329 02/05/13 0550 02/06/13 0642 02/07/13 0835 02/08/13 0450  NA 134* 135 137 140 140 139  K 5.1 5.0 4.5 4.4 3.9 3.7  CL 103 104 103 103 100 98  CO2 16* 15* 20 23 31  32  GLUCOSE 282* 242* 179* 140* 181* 185*  BUN 55* 54* 54* 54* 48* 45*  CREATININE 6.94* 6.71* 6.69* 5.99* 5.27* 4.77*  CALCIUM 9.1 8.6 8.1* 7.9* 7.8* 7.8*  PHOS  --   --   --   --   --  3.3   CBC  Recent Labs Lab 02/03/13 1603 02/08/13 0450  WBC 7.2 5.4  NEUTROABS 6.3  --   HGB 10.6* 8.6*  HCT 30.5* 24.6*  MCV 76.8* 76.6*  PLT 277 201    Medications:    . amLODipine  10 mg Oral Daily  . bicalutamide  50 mg Oral Daily  . enoxaparin (LOVENOX) injection  30 mg Subcutaneous Q24H  . finasteride  5 mg Oral Daily  . hydrALAZINE  25 mg Oral TID  . insulin aspart  0-5 Units Subcutaneous QHS  . insulin aspart  0-9 Units Subcutaneous TID WC  . metoprolol  50 mg Oral BID  . simvastatin  20 mg Oral QHS  . tamsulosin  0.4 mg Oral Daily     Assessment/ Plan:   1. Acute renal failure chronic  kidney disease stage III: This is due to obstructive uropathy from what appears to be metastatic prostatic cancer. Renal function continues to improve with foley in place and UOP is fair. NO acute HD needs noted. Urology notes reviewed. Goals of care/management plan also delineated by urology to help this poorly compliant patient with a difficult social situation. Renal US shows improvement in hydronephrosis- await further decision by urology on need for interventions.  2. Hypertension: Fair control on current regime, increase amlodipine to 10mg  qday.  3. Metastatic prostate cancer: PET scan report reviewed, androgen deprivation therapy started and await radiation oncology evaluation.   Zetta Bills, MD 02/08/2013, 9:59 AM

## 2013-02-08 NOTE — Clinical Social Work Note (Signed)
Clinical Social Worker updated patient and daughter with SNF bed offers received and CSW left a copy of the responses in patient's room. CSW will continue to follow.  Rozetta Nunnery MSW, [employer] [phone]

## 2013-02-08 NOTE — Progress Notes (Signed)
Subjective: Patient reports he is feeling fine. No problems with the catheter.  Objective: Vital signs in last 24 hours: Temp:  [98.2 F (36.8 C)-98.5 F (36.9 C)] 98.5 F (36.9 C) (03/14 0625) Pulse Rate:  [73-81] 78 (03/14 0625) Resp:  [16-18] 18 (03/14 0625) BP: (112-151)/(57-101) 145/101 mmHg (03/14 0625) SpO2:  [99 %-100 %] 100 % (03/14 0625)  Intake/Output from previous day: 03/13 0701 - 03/14 0700 In: 750 [I.V.:750] Out: 2100 [Urine:2100] Intake/Output this shift:    Physical Exam:  Constitutional: Vital signs reviewed. WD WN in NAD   Eyes: PERRL, No scleral icterus.     Lab Results:  Recent Labs  02/08/13 0450  HGB 8.6*  HCT 24.6*   BMET  Recent Labs  02/07/13 0835 02/08/13 0450  NA 140 139  K 3.9 3.7  CL 100 98  CO2 31 32  GLUCOSE 181* 185*  BUN 48* 45*  CREATININE 5.27* 4.77*  CALCIUM 7.8* 7.8*    Recent Labs  02/08/13 0450  INR 1.06   No results found for this basename: LABURIN,  in the last 72 hours Results for orders placed during the hospital encounter of 02/03/13  URINE CULTURE     Status: None   Collection Time    02/04/13  7:26 PM      Result Value Range Status   Specimen Description URINE, CATHETERIZED   Final   Special Requests Normal   Final   Culture  Setup Time 02/05/2013 03:04   Final   Colony Count NO GROWTH   Final   Culture NO GROWTH   Final   Report Status 02/05/2013 FINAL   Final    Studies/Results: US Renal  02/07/2013  *RADIOLOGY REPORT*  Clinical Data: Follow up bladder decompression  RENAL/URINARY TRACT ULTRASOUND COMPLETE  Comparison:  02/03/2013  Findings:  Right Kidney:  Measures 10.6 cm.  Echogenic renal parenchyma, suggesting medical renal disease.  11 x 5 x 9 mm interpolar cyst. Mild hydronephrosis.  Left Kidney:  Measures 11.6 cm.  Echogenic renal parenchyma, suggesting medical renal disease.  Moderate hydronephrosis.  Bladder:  7.3 x 7.6 x 8.1 cm heterogeneous mass/debris within an otherwise decompressed  bladder with indwelling Foley catheter.  IMPRESSION: Mild right and moderate left hydronephrosis.  Stable bladder mass/debris.  Indwelling Foley catheter.   Original Report Authenticated By: Charline Bills, M.D.    Dg Abd Portable 2v  02/07/2013  *RADIOLOGY REPORT*  Clinical Data: Enteritis  PORTABLE ABDOMEN - 2 VIEW  Comparison: CT 02/03/2013, KUB 02/03/2013  Findings: Mildly distended small bowel loops, similar to the prior study.  No air-fluid levels on the decubitus view.  There is gas in nondilated colon.  Air-fluid levels are not prominent on the decubitus view.  No free air.  IMPRESSION: No significant change.  Possible gastroenteritis.  Partial small bowel obstruction also possible.   Original Report Authenticated By: Janeece Riggers, M.D.     Assessment/Plan:   Metastatic adenocarcinoma prostate. He is symptomatic from his bony metastatic disease, as well as bladder outlet obstruction. Bladder outlet obstruction is caused hydronephrosis, which is improving with catheter drainage. Creatinine is trending downward.  Testosterone level is pending. He received his Lupron injection. He should continue on Casodex for at least 3 weeks to treat his testosterone flare. I have asked radiation oncology to see him regarding treatment of symptomatic bone lesions. I think he can start bone therapy once he is out of the hospital withXGEVA, as a bisphosphonate is contraindicated because of his renal function. I  do not think he needs a dental consult prior to this, as he he is edentulous.   LOS: 5 days   Marcine Matar M 02/08/2013, 8:48 AM

## 2013-02-08 NOTE — Consult Note (Signed)
Radiation Oncology         [phone]) [phone] ________________________________  Name: [name] MRN: [id number]  Date: 02/03/2013  DOB: 1935-09-13  WG:[id number] TOM, MD  No ref. [name] found     REFERRING PHYSICIAN: No ref. [name] found   DIAGNOSIS: The primary encounter diagnosis was Acute renal failure. Diagnoses of Chronic renal insufficiency, Diabetes mellitus, Essential hypertension, benign, Prostate cancer, Renal insufficiency, Type II or unspecified type diabetes mellitus without mention of complication, not stated as uncontrolled, Unspecified disorder resulting from impaired renal function, Hydronephrosis, bilateral, UTI (lower urinary tract infection), and Hypertension were also pertinent to this visit.   HISTORY OF PRESENT ILLNESS::[name] is a 78 y.o. male who is seen for an initial consultation visit. The patient has a history of metastatic prostate cancer. Patient has had a history of noncompliance. He has been on androgen ablation but it has been a number of months since his last injection. He patient was admitted with some lower abdominal/upper pelvic pain. He was found to have bilateral hydronephrosis as well as some urinary bladder obstruction. The patient has had a catheter placed and clinically he is doing better. The patient today indicates that his presenting pain is much improved and is lab work has been improving as well.  The patient has been complaining of some significant pain in the right shoulder region. He had a bone scan which showed multi-focal osseous disease with progression. In particular, activity was seen within the proximal right femur, sacral region, and proximal left femur. The patient denies to me today any lower back pain. He does state that he has occasional hip pain.   PREVIOUS RADIATION THERAPY: No   PAST MEDICAL HISTORY:  has a past medical history of CAD (coronary artery disease); Hypertension; Obesity; Dyslipidemia; Myocardial  infarction (2007); H/O allergic rhinitis; Cancer; Anemia; H/O: gout; H/O hemorrhoids; Pneumonia; H/O: GI bleed; Bone metastasis; Diabetes mellitus; Arthritis; Rhinitis, allergic; Carpal tunnel syndrome; Renal insufficiency; Renal insufficiency; Fatty liver; and Shoulder dislocation (1970).     PAST SURGICAL HISTORY: Past Surgical History  Procedure Laterality Date  . Transurethral resection of prostate  06/08/2011, 11/08/06  . Right hand surgery  1974     FAMILY HISTORY: family history includes Diabetes in his mother.   SOCIAL HISTORY:  reports that he has quit smoking. He has never used smokeless tobacco. He reports that he does not drink alcohol or use illicit drugs.   ALLERGIES: Aspirin and Feldene   MEDICATIONS:  Current Facility-Administered Medications  Medication Dose Route Frequency Zonia Caplin Last Rate Last Dose  . amLODipine (NORVASC) tablet 10 mg  10 mg Oral Daily Jay K. Allena Katz, MD   10 mg at 02/08/13 1024  . bicalutamide (CASODEX) tablet 50 mg  50 mg Oral Daily Marcine Matar, MD   50 mg at 02/08/13 1026  . enoxaparin (LOVENOX) injection 30 mg  30 mg Subcutaneous Q24H Kathlen Mody, MD   30 mg at 02/08/13 1027  . finasteride (PROSCAR) tablet 5 mg  5 mg Oral Daily Ripudeep K Rai, MD   5 mg at 02/08/13 1029  . hydrALAZINE (APRESOLINE) tablet 25 mg  25 mg Oral TID Kathlen Mody, MD   25 mg at 02/08/13 2134  . insulin aspart (novoLOG) injection 0-5 Units  0-5 Units Subcutaneous QHS Ripudeep K Rai, MD      . insulin aspart (novoLOG) injection 0-9 Units  0-9 Units Subcutaneous TID WC Ripudeep Jenna Luo, MD   1 Units at 02/08/13 1820  . magnesium oxide (MAG-OX) tablet  400 mg  400 mg Oral BID Ripudeep Jenna Luo, MD   400 mg at 02/08/13 2134  . metoprolol (LOPRESSOR) tablet 50 mg  50 mg Oral BID Kathlen Mody, MD   50 mg at 02/08/13 2134  . metroNIDAZOLE (FLAGYL) tablet 500 mg  500 mg Oral Q8H Ripudeep K Rai, MD   500 mg at 02/08/13 2134  . nitroGLYCERIN (NITROSTAT) SL tablet 0.4 mg  0.4 mg  Sublingual Q5 min PRN Kathlen Mody, MD      . oxyCODONE (Oxy IR/ROXICODONE) immediate release tablet 5 mg  5 mg Oral Q6H PRN Ripudeep K Rai, MD      . simvastatin (ZOCOR) tablet 20 mg  20 mg Oral QHS Kathlen Mody, MD   20 mg at 02/08/13 2134  . sodium bicarbonate 150 mEq in dextrose 5 % 1,000 mL infusion   Intravenous Continuous Cecille Aver, MD 75 mL/hr at 02/08/13 1822    . tamsulosin (FLOMAX) capsule 0.4 mg  0.4 mg Oral Daily Kathlen Mody, MD   0.4 mg at 02/08/13 1035     REVIEW OF SYSTEMS:  A 15 point review of systems is documented in the electronic medical record. This was obtained by the nursing staff. However, I reviewed this with the patient to discuss relevant findings and make appropriate changes.  Pertinent items are noted in HPI.    PHYSICAL EXAM:  height is 5\' 7"  (1.702 m) and weight is 197 lb (89.359 kg). His oral temperature is 97.8 F (36.6 C). His blood pressure is 107/75 and his pulse is 83. His respiration is 20 and oxygen saturation is 96%.   General: Well-developed, in no acute distress, lying in a hospital bed, the patient was quiet with me today although he did answer to direct questions appropriately. HEENT: Normocephalic, atraumatic Cardiovascular: Regular rate and rhythm Respiratory: Clear to auscultation bilaterally GI: Soft, nontender, normal bowel sounds Extremities: No edema present    LABORATORY DATA:  Lab Results  Component Value Date   WBC 5.4 02/08/2013   HGB 8.6* 02/08/2013   HCT 24.6* 02/08/2013   MCV 76.6* 02/08/2013   PLT 201 02/08/2013   Lab Results  Component Value Date   NA 139 02/08/2013   K 3.7 02/08/2013   CL 98 02/08/2013   CO2 32 02/08/2013   Lab Results  Component Value Date   ALT 12 02/03/2013   AST 72* 02/03/2013   ALKPHOS 1452* 02/03/2013   BILITOT 0.4 02/03/2013      RADIOGRAPHY: Ct Abdomen Pelvis Wo Contrast  02/03/2013  *RADIOLOGY REPORT*  Clinical Data: Abdominal pain, history of prostate cancer and chronic kidney disease   CT ABDOMEN AND PELVIS WITHOUT CONTRAST  Technique:  Multidetector CT imaging of the abdomen and pelvis was performed following the standard protocol without intravenous contrast.  Comparison: 02/03/2013 radiographs  Findings: Degraded by streak artifact due to extremity positioning. There are small bilateral pleural effusions.  Associated airspace opacities; atelectasis versus infiltrate.  Coronary artery calcification.  Heart size upper normal.  Small hiatal hernia.  Organ abnormality/lesion detection is limited in the absence of intravenous contrast. Within this limitation, unremarkable liver, spleen, pancreas, adrenal glands.  No radiodense gallstones.  No biliary ductal dilatation.  Severe bilateral hydroureteronephrosis to the level of the UVJs.  Mild colonic diverticulosis.  No CT evidence for colitis or diverticulitis.  There is contrast within the right colon.  Small bowel loops are normal course and caliber.  No free intraperitoneal air or fluid.  Nonspecific retroperitoneal lymph nodes,  measuring up to 12 mm short axis periaortic. Left external iliac chain lymph node measuring 10 mm short axis.  There is scattered atherosclerotic calcification of the aorta and its branches. No aneurysmal dilatation.  Distended bladder with bilateral diverticula.  Multilobulated filling defect along the bladder inferiorly.  Multiple sclerotic foci throughout the visualized bones.  No displaced fracture identified.  IMPRESSION: No bowel obstruction. Unenhanced CT has limited sensitivity for bowel ischemia detection.  No secondary signs such as bowel wall pneumatosis or focal/segmental thickening.  Severe bilateral hydroureteronephrosis and bladder distension. Multiloculated filling defect along the bladder wall inferiorly. Correlate with urinalysis/cystoscopy.  May reflect prostate/bladder carcinoma and/or blood clots.  Evidence of metastatic disease with scattered sclerotic foci and retroperitoneal lymphadenopathy.  Small  bilateral pleural effusions with associated airspace opacities; atelectasis versus infiltrate.   Original Report Authenticated By: Jearld Lesch, M.D.    Nm Bone Scan Whole Body  02/05/2013  *RADIOLOGY REPORT*  Clinical Data: Follow up prostate cancer metastasis  NUCLEAR MEDICINE WHOLE BODY BONE SCINTIGRAPHY  Technique:  Whole body anterior and posterior images were obtained approximately 3 hours after intravenous injection of radiopharmaceutical.  Radiopharmaceutical: CURIE TC-MDP TECHNETIUM TC 8M MEDRONATE IV KIT  Comparison: 02/09/2012  Findings: Multifocal abnormal areas of increased radiotracer uptake are noted within the axial and appendicular skeleton consistent with prostate cancer metastasis. Compared with the previous exam there has been progression of prostate cancer metastasis.  The large lesion within the proximal right proximal humeral has increased in size compared with previous exam. There has also been increase in size of the proximal left femur lesion. Significant increase in size of sacral metastasis.  Increase in the number of the thoracic and rib metastasis.  There is faint physiologic tracer uptake noted within the kidneys and bladder.  IMPRESSION:  1.  Significant interval progression of the bone metastases.   Original Report Authenticated By: Signa Kell, M.D.    US Renal  02/07/2013  *RADIOLOGY REPORT*  Clinical Data: Follow up bladder decompression  RENAL/URINARY TRACT ULTRASOUND COMPLETE  Comparison:  02/03/2013  Findings:  Right Kidney:  Measures 10.6 cm.  Echogenic renal parenchyma, suggesting medical renal disease.  11 x 5 x 9 mm interpolar cyst. Mild hydronephrosis.  Left Kidney:  Measures 11.6 cm.  Echogenic renal parenchyma, suggesting medical renal disease.  Moderate hydronephrosis.  Bladder:  7.3 x 7.6 x 8.1 cm heterogeneous mass/debris within an otherwise decompressed bladder with indwelling Foley catheter.  IMPRESSION: Mild right and moderate left  hydronephrosis.  Stable bladder mass/debris.  Indwelling Foley catheter.   Original Report Authenticated By: Charline Bills, M.D.    US Renal  02/03/2013  *RADIOLOGY REPORT*  Clinical Data: Acute renal failure.  History of prostate cancer.  RENAL/URINARY TRACT ULTRASOUND COMPLETE  Comparison:  None.  Findings:  Right Kidney:  Measures 11.1 cm.  There is marked hydronephrosis. No stone or mass identified.  Left Kidney:  Measures 11.3 cm.  There is severe hydronephrosis. No stone or mass identified.  Bladder:  A large mass lesion is identified within the urinary bladder.  IMPRESSION: Severe bilateral hydronephrosis likely due to a large mass lesion in the urinary bladder.   Original Report Authenticated By: Holley Dexter, M.D.    Dg Chest Port 1 View  02/03/2013  *RADIOLOGY REPORT*  Clinical Data: Line placement.  PORTABLE CHEST - 1 VIEW  Comparison: Single view of the chest earlier this same date.  Findings: New right IJ catheter is in place.  Tip of the catheter is just  within the right atrium and the line should be withdrawn 3 cm for better positioning.  No pneumothorax identified.  There is cardiomegaly and vascular congestion.  IMPRESSION:  1.  Tip of right IJ catheter projects just in the right atrium. The line should be withdrawn approximately 3 cm for better positioning.  No pneumothorax. 2.  Cardiomegaly and vascular congestion.   Original Report Authenticated By: Holley Dexter, M.D.    Dg Abd Acute W/chest  02/03/2013  *RADIOLOGY REPORT*  Clinical Data: Chronic shortness of breath, constipation, abdominal pain  ACUTE ABDOMEN SERIES (ABDOMEN 2 VIEW & CHEST 1 VIEW)  Comparison: Chest radiographs dated 05/25/2012  Findings: Lungs are essentially clear.  No focal consolidation. Small right pleural effusion.  Mild cardiomegaly.  Nonspecific bowel gas pattern with mucosal thickening involving multiple loops of small bowel in the mid abdomen.  The colon is not decompressed.  This appearance favors a  small bowel enteritis and does not suggest small bowel obstruction.  No evidence of free air under the diaphragm on the upright view.  Degenerative changes of the visualized thoracolumbar spine.  IMPRESSION: Small right pleural effusion.  Mucosal thickening involving multiple small bowel loops, possibly reflecting small bowel enteritis.  No evidence of small bowel obstruction or free air.   Original Report Authenticated By: Charline Bills, M.D.    Dg Abd Portable 2v  02/07/2013  *RADIOLOGY REPORT*  Clinical Data: Enteritis  PORTABLE ABDOMEN - 2 VIEW  Comparison: CT 02/03/2013, KUB 02/03/2013  Findings: Mildly distended small bowel loops, similar to the prior study.  No air-fluid levels on the decubitus view.  There is gas in nondilated colon.  Air-fluid levels are not prominent on the decubitus view.  No free air.  IMPRESSION: No significant change.  Possible gastroenteritis.  Partial small bowel obstruction also possible.   Original Report Authenticated By: Janeece Riggers, M.D.        IMPRESSION: The patient has metastatic prostate cancer with multiple areas of osseous disease. The patient's recent bone scan does show some progression. He complains of significant pain in the right shoulder region and also has some occasional left hip pain within the context of activity within the left proximal femur as well. The patient denied any low back pain to me today.   PLAN: T he patientI believe is a good candidate for palliative radiotherapy. I discussed seeding with such treatment as soon as this could be arranged. He will continue with a simulation today. We will plan to treat the proximal right femur as which is his dominant area of complaint at this time. We also discussed treating the proximal left femur as he does have some occasional pain there and this may help to prevent further weakening in this weight-bearing area. I discussed with him treatment potentially to other areas but with a lack of pain in the  other areas of activity on the bone scan, especially in the sacral region, we elected to hold off on this today.  I will plan to have the patient undergo a course of radiation for 8 fractions on a daily basis Monday through Friday. The treatment should be well-tolerated given the areas of treatment as outlined above. We will watch the patient for skin irritation, fatigue, or other local irritative symptoms. All of the patient's questions were answered.      ________________________________   Radene Gunning, MD, PhD

## 2013-02-08 NOTE — Progress Notes (Signed)
Pt had multiple loose bowel movement all day and all night,non foul smelling and brown in color.

## 2013-02-08 NOTE — Progress Notes (Signed)
Nutrition Brief Note  Patient identified on the Malnutrition Screening Tool (MST) Report  Body mass index is 30.85 kg/(m^2). Patient meets criteria for obese based on current BMI.   Current diet order is CHO Mod Medium, patient is consuming approximately 10-40% of meals at this time. Labs and medications reviewed.   Pt not receptive to RD visit this afternoon.  RD attempted to discuss nutrition and wt status with pt, however unable to engage pt in conversation related to eating/nutrition.  Pt states "I eat what I want, when I want.  I don't want anyone to tell me different."  When RD attempted to re-direct that the goal of visit was to ensure pt was meeting his needs and maintaining his wt for treatment and healing, pt becomes adamant that he is capable of self-care, making decisions on his behalf, and not interested in talking.  RD thanked pt for his time and honesty to which pt replied "I am to who I am, I don't need any help."  No nutrition interventions warranted at this time. Consider diet liberalization to Regular as pt is not following a CHO Modified diet at home and pt is refusing meals due to preference of home cooking.  If nutrition issues arise, please consult RD.   Loyce Dys, MS RD LDN Clinical Inpatient Dietitian Pager: [phone] Weekend/After hours pager: [phone]

## 2013-02-08 NOTE — Progress Notes (Signed)
Patient ID: [name]  male  JYN:[id number]    DOB: 02-25-1935    DOA: 02/03/2013  PCP: Leo Grosser, MD  Assessment/Plan:    Acute renal failure on CKD stage III with metabolic acidosis likely from bilateral hydronephrosis, evident on the renal ultrasound: IMPROVING after Foley catheter placement - Appreciate renal and urology following - Creatinine function improving slowly, no acute needs of hemodialysis    Hydronephrosis, bilateral: Due to bladder mass and metastatic prostate Ca - Bone scan showed a significant interval progression of the bone metastasis with multifocal abnormal areas of increased uptake in axial and appendicular skeleton - Received a Lupron injection, on Casodex continue for 3 weeks - Dr. Retta Diones spoke with radiation oncology in regards to symptomatic lesions    Enteritis: Multiple episodes of diarrhea last night  -DC'ed Zosyn yesterday, patient is tolerating diet, no nausea, vomiting, - Repeat KUB was done which showed possible GE, partial small bowel obstruction is also possible - Will check C. difficile PCR, placed on Flagyl for now  Hypomagnesemia: Replaced    HYPERLIPIDEMIA - on statin    Diabetes mellitus - Continue sliding scale insulin  Right shoulder pain: Bone scan did show large lesion in the proximal right humerus which has increased in size from the previous exam, multiple metastasis throughout including left femur, sacrum metastasis likely the cause of his bilateral lower extremity pain and shoulder pain  DVT Prophylaxis:  Code Status:  Disposition: Patient care-linked today for radiation oncology evaluation, if patient starts radiation treatments, will transfer him to [facility] until safe disposition. D/w Dr Retta Diones, who agrees with the plan.    Subjective: Diarrhea overnight, no nausea or vomiting. Complaining of right shoulder pain, diffuse pain and lower extremity  Objective: Weight change:   Intake/Output Summary (Last  24 hours) at 02/08/13 1320 Last data filed at 02/08/13 0849  Gross per 24 hour  Intake   1230 ml  Output   3202 ml  Net  -1972 ml   Blood pressure 134/72, pulse 78, temperature 97.5 F (36.4 C), temperature source Oral, resp. rate 22, height 5\' 7"  (1.702 m), weight 89.359 kg (197 lb), SpO2 100.00%.  Physical Exam: General: NAD, alert and awake  CVS: S1-S2 clear, no mrg Chest: CTAB Abdomen:  soft NT ,NBS Extremities: no c/c/e GU : foley+  Lab Results: Basic Metabolic Panel:  Recent Labs Lab 02/07/13 0835 02/08/13 0450  NA 140 139  K 3.9 3.7  CL 100 98  CO2 31 32  GLUCOSE 181* 185*  BUN 48* 45*  CREATININE 5.27* 4.77*  CALCIUM 7.8* 7.8*  MG  --  1.3*  PHOS  --  3.3   Liver Function Tests:  Recent Labs Lab 02/03/13 1603 02/08/13 0450  AST 72*  --   ALT 12  --   ALKPHOS 1452*  --   BILITOT 0.4  --   PROT 6.8  --   ALBUMIN 3.4* 2.3*   No results found for this basename: LIPASE, AMYLASE,  in the last 168 hours No results found for this basename: AMMONIA,  in the last 168 hours CBC:  Recent Labs Lab 02/03/13 1603 02/08/13 0450  WBC 7.2 5.4  NEUTROABS 6.3  --   HGB 10.6* 8.6*  HCT 30.5* 24.6*  MCV 76.8* 76.6*  PLT 277 201   Cardiac Enzymes: No results found for this basename: CKTOTAL, CKMB, CKMBINDEX, TROPONINI,  in the last 168 hours BNP: No components found with this basename: POCBNP,  CBG:  Recent Labs Lab  02/07/13 1200 02/07/13 1657 02/07/13 2153 02/08/13 0749 02/08/13 1154  GLUCAP 179* 107* 129* 168* 106*     Micro Results: Recent Results (from the past 240 hour(s))  URINE CULTURE     Status: None   Collection Time    02/04/13  7:26 PM      Result Value Range Status   Specimen Description URINE, CATHETERIZED   Final   Special Requests Normal   Final   Culture  Setup Time 02/05/2013 03:04   Final   Colony Count NO GROWTH   Final   Culture NO GROWTH   Final   Report Status 02/05/2013 FINAL   Final    Studies/Results: Ct  Abdomen Pelvis Wo Contrast  02/03/2013  *RADIOLOGY REPORT*  Clinical Data: Abdominal pain, history of prostate cancer and chronic kidney disease  CT ABDOMEN AND PELVIS WITHOUT CONTRAST  Technique:  Multidetector CT imaging of the abdomen and pelvis was performed following the standard protocol without intravenous contrast.  Comparison: 02/03/2013 radiographs  Findings: Degraded by streak artifact due to extremity positioning. There are small bilateral pleural effusions.  Associated airspace opacities; atelectasis versus infiltrate.  Coronary artery calcification.  Heart size upper normal.  Small hiatal hernia.  Organ abnormality/lesion detection is limited in the absence of intravenous contrast. Within this limitation, unremarkable liver, spleen, pancreas, adrenal glands.  No radiodense gallstones.  No biliary ductal dilatation.  Severe bilateral hydroureteronephrosis to the level of the UVJs.  Mild colonic diverticulosis.  No CT evidence for colitis or diverticulitis.  There is contrast within the right colon.  Small bowel loops are normal course and caliber.  No free intraperitoneal air or fluid.  Nonspecific retroperitoneal lymph nodes, measuring up to 12 mm short axis periaortic. Left external iliac chain lymph node measuring 10 mm short axis.  There is scattered atherosclerotic calcification of the aorta and its branches. No aneurysmal dilatation.  Distended bladder with bilateral diverticula.  Multilobulated filling defect along the bladder inferiorly.  Multiple sclerotic foci throughout the visualized bones.  No displaced fracture identified.  IMPRESSION: No bowel obstruction. Unenhanced CT has limited sensitivity for bowel ischemia detection.  No secondary signs such as bowel wall pneumatosis or focal/segmental thickening.  Severe bilateral hydroureteronephrosis and bladder distension. Multiloculated filling defect along the bladder wall inferiorly. Correlate with urinalysis/cystoscopy.  May reflect  prostate/bladder carcinoma and/or blood clots.  Evidence of metastatic disease with scattered sclerotic foci and retroperitoneal lymphadenopathy.  Small bilateral pleural effusions with associated airspace opacities; atelectasis versus infiltrate.   Original Report Authenticated By: Jearld Lesch, M.D.    US Renal  02/03/2013  *RADIOLOGY REPORT*  Clinical Data: Acute renal failure.  History of prostate cancer.  RENAL/URINARY TRACT ULTRASOUND COMPLETE  Comparison:  None.  Findings:  Right Kidney:  Measures 11.1 cm.  There is marked hydronephrosis. No stone or mass identified.  Left Kidney:  Measures 11.3 cm.  There is severe hydronephrosis. No stone or mass identified.  Bladder:  A large mass lesion is identified within the urinary bladder.  IMPRESSION: Severe bilateral hydronephrosis likely due to a large mass lesion in the urinary bladder.   Original Report Authenticated By: Holley Dexter, M.D.    Dg Chest Port 1 View  02/03/2013  *RADIOLOGY REPORT*  Clinical Data: Line placement.  PORTABLE CHEST - 1 VIEW  Comparison: Single view of the chest earlier this same date.  Findings: New right IJ catheter is in place.  Tip of the catheter is just within the right atrium and the line should  be withdrawn 3 cm for better positioning.  No pneumothorax identified.  There is cardiomegaly and vascular congestion.  IMPRESSION:  1.  Tip of right IJ catheter projects just in the right atrium. The line should be withdrawn approximately 3 cm for better positioning.  No pneumothorax. 2.  Cardiomegaly and vascular congestion.   Original Report Authenticated By: Holley Dexter, M.D.    Dg Abd Acute W/chest  02/03/2013  *RADIOLOGY REPORT*  Clinical Data: Chronic shortness of breath, constipation, abdominal pain  ACUTE ABDOMEN SERIES (ABDOMEN 2 VIEW & CHEST 1 VIEW)  Comparison: Chest radiographs dated 05/25/2012  Findings: Lungs are essentially clear.  No focal consolidation. Small right pleural effusion.  Mild cardiomegaly.   Nonspecific bowel gas pattern with mucosal thickening involving multiple loops of small bowel in the mid abdomen.  The colon is not decompressed.  This appearance favors a small bowel enteritis and does not suggest small bowel obstruction.  No evidence of free air under the diaphragm on the upright view.  Degenerative changes of the visualized thoracolumbar spine.  IMPRESSION: Small right pleural effusion.  Mucosal thickening involving multiple small bowel loops, possibly reflecting small bowel enteritis.  No evidence of small bowel obstruction or free air.   Original Report Authenticated By: Charline Bills, M.D.     Medications: Scheduled Meds: . amLODipine  10 mg Oral Daily  . bicalutamide  50 mg Oral Daily  . enoxaparin (LOVENOX) injection  30 mg Subcutaneous Q24H  . finasteride  5 mg Oral Daily  . hydrALAZINE  25 mg Oral TID  . insulin aspart  0-5 Units Subcutaneous QHS  . insulin aspart  0-9 Units Subcutaneous TID WC  . metoprolol  50 mg Oral BID  . simvastatin  20 mg Oral QHS  . tamsulosin  0.4 mg Oral Daily      LOS: 5 days   Guillermo Difrancesco M.D. [facility] 02/08/2013, 1:20 PM Pager: [phone]  If 7PM-7AM, please contact night-coverage [email or web address] Password [email or web address]

## 2013-02-09 LAB — CBC
HCT: 25.3 % — ABNORMAL LOW (ref 39.0–52.0)
Hemoglobin: 8.9 g/dL — ABNORMAL LOW (ref 13.0–17.0)
MCH: 27 pg (ref 26.0–34.0)
MCHC: 35.2 g/dL (ref 30.0–36.0)
MCV: 76.7 fL — ABNORMAL LOW (ref 78.0–100.0)
Platelets: 202 10*3/uL (ref 150–400)
RBC: 3.3 MIL/uL — ABNORMAL LOW (ref 4.22–5.81)
RDW: 16 % — ABNORMAL HIGH (ref 11.5–15.5)
WBC: 4.4 10*3/uL (ref 4.0–10.5)

## 2013-02-09 LAB — RENAL FUNCTION PANEL
Albumin: 2.2 g/dL — ABNORMAL LOW (ref 3.5–5.2)
BUN: 35 mg/dL — ABNORMAL HIGH (ref 6–23)
CO2: 32 mEq/L (ref 19–32)
Calcium: 7.9 mg/dL — ABNORMAL LOW (ref 8.4–10.5)
Chloride: 99 mEq/L (ref 96–112)
Creatinine, Ser: 4.09 mg/dL — ABNORMAL HIGH (ref 0.50–1.35)
GFR calc Af Amer: 15 mL/min — ABNORMAL LOW (ref >90)
GFR calc non Af Amer: 13 mL/min — ABNORMAL LOW (ref >90)
Glucose, Bld: 122 mg/dL — ABNORMAL HIGH (ref 70–99)
Phosphorus: 3.2 mg/dL (ref 2.3–4.6)
Potassium: 3.6 mEq/L (ref 3.5–5.1)
Sodium: 139 mEq/L (ref 135–145)

## 2013-02-09 LAB — GLUCOSE, CAPILLARY
Glucose-Capillary: 104 mg/dL — ABNORMAL HIGH (ref 70–99)
Glucose-Capillary: 122 mg/dL — ABNORMAL HIGH (ref 70–99)
Glucose-Capillary: 94 mg/dL (ref 70–99)
Glucose-Capillary: 119 mg/dL — ABNORMAL HIGH (ref 70–99)
Glucose-Capillary: 159 mg/dL — ABNORMAL HIGH (ref 70–99)

## 2013-02-09 NOTE — Progress Notes (Signed)
C-diff PCR final result was negative.

## 2013-02-09 NOTE — Progress Notes (Signed)
Patient ID: [name], male   DOB: [birth year], 78 y.o.   MRN: [id number]    Subjective: He is doing well with a decline in the creatinine and a good urine output with foley drainage. ROS: Negative except he would like regular food.   Objective: Vital signs in last 24 hours: Temp:  [97.8 F (36.6 C)-98.1 F (36.7 C)] 98.1 F (36.7 C) (03/15 0507) Pulse Rate:  [71-83] 71 (03/15 0507) Resp:  [18-20] 18 (03/15 0507) BP: (107-140)/(72-78) 121/77 mmHg (03/15 0507) SpO2:  [96 %-97 %] 97 % (03/15 0507)  Intake/Output from previous day: 03/14 0701 - 03/15 0700 In: 2432.8 [P.O.:240; I.V.:1498.8] Out: 2453 [Urine:2450; Stool:3] Intake/Output this shift:    General appearance: alert and no distress Male genitalia: normal, Urine in foley tubing light pink.   Lab Results:   Recent Labs  02/08/13 0450 02/09/13 0625  WBC 5.4 4.4  HGB 8.6* 8.9*  HCT 24.6* 25.3*  PLT 201 202   BMET  Recent Labs  02/08/13 0450 02/09/13 0625  NA 139 139  K 3.7 3.6  CL 98 99  CO2 32 32  GLUCOSE 185* 122*  BUN 45* 35*  CREATININE 4.77* 4.09*  CALCIUM 7.8* 7.9*   PT/INR  Recent Labs  02/08/13 0450  LABPROT 13.7  INR 1.06   ABG No results found for this basename: PHART, PCO2, PO2, HCO3,  in the last 72 hours  Studies/Results: Dg Abd Portable 2v  02/07/2013  *RADIOLOGY REPORT*  Clinical Data: Enteritis  PORTABLE ABDOMEN - 2 VIEW  Comparison: CT 02/03/2013, KUB 02/03/2013  Findings: Mildly distended small bowel loops, similar to the prior study.  No air-fluid levels on the decubitus view.  There is gas in nondilated colon.  Air-fluid levels are not prominent on the decubitus view.  No free air.  IMPRESSION: No significant change.  Possible gastroenteritis.  Partial small bowel obstruction also possible.   Original Report Authenticated By: Janeece Riggers, M.D.     Anti-infectives: Anti-infectives   Start     Dose/Rate Route Frequency Ordered Stop   02/08/13 1400  metroNIDAZOLE (FLAGYL)  tablet 500 mg     500 mg Oral 3 times per day 02/08/13 1328     02/04/13 0600  piperacillin-tazobactam (ZOSYN) IVPB 2.25 g  Status:  Discontinued     2.25 g 100 mL/hr over 30 Minutes Intravenous 3 times per day 02/03/13 1932 02/07/13 1358   02/03/13 1945  piperacillin-tazobactam (ZOSYN) IVPB 3.375 g     3.375 g 100 mL/hr over 30 Minutes Intravenous  Once 02/03/13 1932 02/03/13 2211      Current Facility-Administered Medications  Medication Dose Route Frequency Provider Last Rate Last Dose  . amLODipine (NORVASC) tablet 10 mg  10 mg Oral Daily Jay K. Allena Katz, MD   10 mg at 02/08/13 1024  . bicalutamide (CASODEX) tablet 50 mg  50 mg Oral Daily Marcine Matar, MD   50 mg at 02/08/13 1026  . enoxaparin (LOVENOX) injection 30 mg  30 mg Subcutaneous Q24H Kathlen Mody, MD   30 mg at 02/08/13 1027  . finasteride (PROSCAR) tablet 5 mg  5 mg Oral Daily Ripudeep K Rai, MD   5 mg at 02/08/13 1029  . hydrALAZINE (APRESOLINE) tablet 25 mg  25 mg Oral TID Kathlen Mody, MD   25 mg at 02/08/13 2134  . insulin aspart (novoLOG) injection 0-5 Units  0-5 Units Subcutaneous QHS Ripudeep K Rai, MD      . insulin aspart (novoLOG) injection 0-9 Units  0-9 Units Subcutaneous TID WC Ripudeep Jenna Luo, MD   1 Units at 02/09/13 0818  . magnesium oxide (MAG-OX) tablet 400 mg  400 mg Oral BID Ripudeep Jenna Luo, MD   400 mg at 02/08/13 2134  . metoprolol (LOPRESSOR) tablet 50 mg  50 mg Oral BID Kathlen Mody, MD   50 mg at 02/08/13 2134  . metroNIDAZOLE (FLAGYL) tablet 500 mg  500 mg Oral Q8H Ripudeep K Rai, MD   500 mg at 02/09/13 0542  . nitroGLYCERIN (NITROSTAT) SL tablet 0.4 mg  0.4 mg Sublingual Q5 min PRN Kathlen Mody, MD      . oxyCODONE (Oxy IR/ROXICODONE) immediate release tablet 5 mg  5 mg Oral Q6H PRN Ripudeep K Rai, MD      . simvastatin (ZOCOR) tablet 20 mg  20 mg Oral QHS Kathlen Mody, MD   20 mg at 02/08/13 2134  . sodium bicarbonate 150 mEq in dextrose 5 % 1,000 mL infusion   Intravenous Continuous Cecille Aver, MD 75 mL/hr at 02/09/13 0654    . tamsulosin (FLOMAX) capsule 0.4 mg  0.4 mg Oral Daily Kathlen Mody, MD   0.4 mg at 02/08/13 1035    Assessment: Renal function improving with bladder drainage.    Plan: Continue current care.      LOS: 6 days    Emslee Lopezmartinez J 02/09/2013

## 2013-02-09 NOTE — Progress Notes (Signed)
Patient ID: [name], male   DOB: 13-Nov-1935, 78 y.o.   MRN: [id number]   [facility] Progress Note    Subjective:   Reports to be feeling fair- denies CP/SOB   Objective:   BP 121/77  Pulse 71  Temp(Src) 98.1 F (36.7 C) (Oral)  Resp 18  Ht 5\' 7"  (1.702 m)  Wt 89.359 kg (197 lb)  BMI 30.85 kg/m2  SpO2 97%  Intake/Output Summary (Last 24 hours) at 02/09/13 6213 Last data filed at 02/09/13 0865  Gross per 24 hour  Intake 2192.75 ml  Output   1903 ml  Net 289.75 ml   Weight change:   Physical Exam: Gen: sitting up on the side of his bed eating breakfast HQI:ONGEX RRR, normal S1 and S2  Resp:Decreased BS over bases- otherwise CTA BMW:UXLK, obese, NT, BS normal GMW:NUUVO LE edema  Imaging: Dg Abd Portable 2v  02/07/2013  *RADIOLOGY REPORT*  Clinical Data: Enteritis  PORTABLE ABDOMEN - 2 VIEW  Comparison: CT 02/03/2013, KUB 02/03/2013  Findings: Mildly distended small bowel loops, similar to the prior study.  No air-fluid levels on the decubitus view.  There is gas in nondilated colon.  Air-fluid levels are not prominent on the decubitus view.  No free air.  IMPRESSION: No significant change.  Possible gastroenteritis.  Partial small bowel obstruction also possible.   Original Report Authenticated By: Janeece Riggers, M.D.     Labs: BMET  Recent Labs Lab 02/03/13 1603 02/04/13 1329 02/05/13 0550 02/06/13 5366 02/07/13 0835 02/08/13 0450 02/09/13 0625  NA 134* 135 137 140 140 139 139  K 5.1 5.0 4.5 4.4 3.9 3.7 3.6  CL 103 104 103 103 100 98 99  CO2 16* 15* 20 23 31  32 32  GLUCOSE 282* 242* 179* 140* 181* 185* 122*  BUN 55* 54* 54* 54* 48* 45* 35*  CREATININE 6.94* 6.71* 6.69* 5.99* 5.27* 4.77* 4.09*  CALCIUM 9.1 8.6 8.1* 7.9* 7.8* 7.8* 7.9*  PHOS  --   --   --   --   --  3.3 3.2   CBC  Recent Labs Lab 02/03/13 1603 02/08/13 0450 02/09/13 0625  WBC 7.2 5.4 4.4  NEUTROABS 6.3  --   --   HGB 10.6* 8.6* 8.9*  HCT 30.5* 24.6* 25.3*  MCV  76.8* 76.6* 76.7*  PLT 277 201 202    Medications:    . amLODipine  10 mg Oral Daily  . bicalutamide  50 mg Oral Daily  . enoxaparin (LOVENOX) injection  30 mg Subcutaneous Q24H  . finasteride  5 mg Oral Daily  . hydrALAZINE  25 mg Oral TID  . insulin aspart  0-5 Units Subcutaneous QHS  . insulin aspart  0-9 Units Subcutaneous TID WC  . magnesium oxide  400 mg Oral BID  . metoprolol  50 mg Oral BID  . metroNIDAZOLE  500 mg Oral Q8H  . simvastatin  20 mg Oral QHS  . tamsulosin  0.4 mg Oral Daily     Assessment/ Plan:   1. Acute renal failure chronic kidney disease stage III: This is due to obstructive uropathy from what appears to be metastatic prostatic cancer. Renal function continues to improve with foley in place and UOP is fair. NO acute HD needs noted. Urology notes reviewed. Goals of care/management plan also delineated by urology to help this poorly compliant patient with a difficult social situation. DC IVFs and allow PO intake 2. Hypertension: Fair control on current regime, increase amlodipine to 10mg  qday.  3. Metastatic prostate cancer: PET scan report reviewed, androgen deprivation therapy started and await radiation oncology evaluation.  Will sign off and set him up for OP renal follow up. Please call with concerns  Zetta Bills, MD 02/09/2013, 9:05 AM

## 2013-02-09 NOTE — Progress Notes (Signed)
Patient ID: [name]  male  WUJ:[id number]    DOB: 1935/07/04    DOA: 02/03/2013  PCP: Leo Grosser, MD  Assessment/Plan:    Acute renal failure on CKD stage III with metabolic acidosis likely from bilateral hydronephrosis, evident on the renal ultrasound: IMPROVING after Foley catheter placement - Appreciate renal and urology following, Cr function improving slowly, no acute needs of hemodialysis    Hydronephrosis, bilateral: Due to bladder mass and metastatic prostate Ca - Bone scan showed a significant interval progression of the bone metastasis with multifocal abnormal areas of increased uptake in axial and appendicular skeleton - Received a Lupron injection, on Casodex, continue for 3 weeks - Dr. Retta Diones spoke with radiation oncology in regards to symptomatic lesions - Rad Onc eval reviewed, patient to start radiation treatments on Monday    Enteritis: Multiple episodes of diarrhea last night  - Repeat KUB was done which showed possible GE, partial small bowel obstruction is also possible - CDiff PCR negative, on Flagyl for now    HYPERLIPIDEMIA - on statin    Diabetes mellitus - Continue sliding scale insulin  Right shoulder pain: Bone scan did show large lesion in the proximal right humerus which has increased in size from the previous exam, multiple metastasis throughout including left femur, sacrum metastasis likely the cause of his bilateral lower extremity pain and shoulder pain  DVT Prophylaxis:  Code Status:  Disposition: Patient starting radiation treatments on Monday, will transfer him to [facility]. D/w Dr Retta Diones, who agrees with the plan.    Subjective: Feeling a lot better today, no specific complaints this morning  Objective: Weight change:   Intake/Output Summary (Last 24 hours) at 02/09/13 1226 Last data filed at 02/09/13 1036  Gross per 24 hour  Intake 2312.75 ml  Output   1904 ml  Net 408.75 ml   Blood pressure 121/77, pulse  71, temperature 98.1 F (36.7 C), temperature source Oral, resp. rate 18, height 5\' 7"  (1.702 m), weight 89.359 kg (197 lb), SpO2 97.00%.  Physical Exam: General: NAD,  CVS: S1-S2 clear, no mrg Chest: CTAB Abdomen:  soft NT ,NBS Extremities: no c/c/e GU : foley+  Lab Results: Basic Metabolic Panel:  Recent Labs Lab 02/08/13 0450 02/09/13 0625  NA 139 139  K 3.7 3.6  CL 98 99  CO2 32 32  GLUCOSE 185* 122*  BUN 45* 35*  CREATININE 4.77* 4.09*  CALCIUM 7.8* 7.9*  MG 1.3*  --   PHOS 3.3 3.2   Liver Function Tests:  Recent Labs Lab 02/03/13 1603 02/08/13 0450 02/09/13 0625  AST 72*  --   --   ALT 12  --   --   ALKPHOS 1452*  --   --   BILITOT 0.4  --   --   PROT 6.8  --   --   ALBUMIN 3.4* 2.3* 2.2*   No results found for this basename: LIPASE, AMYLASE,  in the last 168 hours No results found for this basename: AMMONIA,  in the last 168 hours CBC:  Recent Labs Lab 02/03/13 1603 02/08/13 0450 02/09/13 0625  WBC 7.2 5.4 4.4  NEUTROABS 6.3  --   --   HGB 10.6* 8.6* 8.9*  HCT 30.5* 24.6* 25.3*  MCV 76.8* 76.6* 76.7*  PLT 277 201 202   Cardiac Enzymes: No results found for this basename: CKTOTAL, CKMB, CKMBINDEX, TROPONINI,  in the last 168 hours BNP: No components found with this basename: POCBNP,  CBG:  Recent Labs  Lab 02/08/13 1154 02/08/13 1737 02/08/13 2240 02/09/13 0736 02/09/13 1208  GLUCAP 106* 142* 104* 122* 94     Micro Results: Recent Results (from the past 240 hour(s))  URINE CULTURE     Status: None   Collection Time    02/04/13  7:26 PM      Result Value Range Status   Specimen Description URINE, CATHETERIZED   Final   Special Requests Normal   Final   Culture  Setup Time 02/05/2013 03:04   Final   Colony Count NO GROWTH   Final   Culture NO GROWTH   Final   Report Status 02/05/2013 FINAL   Final  CLOSTRIDIUM DIFFICILE BY PCR     Status: None   Collection Time    02/08/13  5:20 PM      Result Value Range Status   C  difficile by pcr NEGATIVE  NEGATIVE Final    Studies/Results: Ct Abdomen Pelvis Wo Contrast  02/03/2013  *RADIOLOGY REPORT*  Clinical Data: Abdominal pain, history of prostate cancer and chronic kidney disease  CT ABDOMEN AND PELVIS WITHOUT CONTRAST  Technique:  Multidetector CT imaging of the abdomen and pelvis was performed following the standard protocol without intravenous contrast.  Comparison: 02/03/2013 radiographs  Findings: Degraded by streak artifact due to extremity positioning. There are small bilateral pleural effusions.  Associated airspace opacities; atelectasis versus infiltrate.  Coronary artery calcification.  Heart size upper normal.  Small hiatal hernia.  Organ abnormality/lesion detection is limited in the absence of intravenous contrast. Within this limitation, unremarkable liver, spleen, pancreas, adrenal glands.  No radiodense gallstones.  No biliary ductal dilatation.  Severe bilateral hydroureteronephrosis to the level of the UVJs.  Mild colonic diverticulosis.  No CT evidence for colitis or diverticulitis.  There is contrast within the right colon.  Small bowel loops are normal course and caliber.  No free intraperitoneal air or fluid.  Nonspecific retroperitoneal lymph nodes, measuring up to 12 mm short axis periaortic. Left external iliac chain lymph node measuring 10 mm short axis.  There is scattered atherosclerotic calcification of the aorta and its branches. No aneurysmal dilatation.  Distended bladder with bilateral diverticula.  Multilobulated filling defect along the bladder inferiorly.  Multiple sclerotic foci throughout the visualized bones.  No displaced fracture identified.  IMPRESSION: No bowel obstruction. Unenhanced CT has limited sensitivity for bowel ischemia detection.  No secondary signs such as bowel wall pneumatosis or focal/segmental thickening.  Severe bilateral hydroureteronephrosis and bladder distension. Multiloculated filling defect along the bladder wall  inferiorly. Correlate with urinalysis/cystoscopy.  May reflect prostate/bladder carcinoma and/or blood clots.  Evidence of metastatic disease with scattered sclerotic foci and retroperitoneal lymphadenopathy.  Small bilateral pleural effusions with associated airspace opacities; atelectasis versus infiltrate.   Original Report Authenticated By: Jearld Lesch, M.D.    US Renal  02/03/2013  *RADIOLOGY REPORT*  Clinical Data: Acute renal failure.  History of prostate cancer.  RENAL/URINARY TRACT ULTRASOUND COMPLETE  Comparison:  None.  Findings:  Right Kidney:  Measures 11.1 cm.  There is marked hydronephrosis. No stone or mass identified.  Left Kidney:  Measures 11.3 cm.  There is severe hydronephrosis. No stone or mass identified.  Bladder:  A large mass lesion is identified within the urinary bladder.  IMPRESSION: Severe bilateral hydronephrosis likely due to a large mass lesion in the urinary bladder.   Original Report Authenticated By: Holley Dexter, M.D.    Dg Chest Port 1 View  02/03/2013  *RADIOLOGY REPORT*  Clinical Data: Line  placement.  PORTABLE CHEST - 1 VIEW  Comparison: Single view of the chest earlier this same date.  Findings: New right IJ catheter is in place.  Tip of the catheter is just within the right atrium and the line should be withdrawn 3 cm for better positioning.  No pneumothorax identified.  There is cardiomegaly and vascular congestion.  IMPRESSION:  1.  Tip of right IJ catheter projects just in the right atrium. The line should be withdrawn approximately 3 cm for better positioning.  No pneumothorax. 2.  Cardiomegaly and vascular congestion.   Original Report Authenticated By: Holley Dexter, M.D.    Dg Abd Acute W/chest  02/03/2013  *RADIOLOGY REPORT*  Clinical Data: Chronic shortness of breath, constipation, abdominal pain  ACUTE ABDOMEN SERIES (ABDOMEN 2 VIEW & CHEST 1 VIEW)  Comparison: Chest radiographs dated 05/25/2012  Findings: Lungs are essentially clear.  No focal  consolidation. Small right pleural effusion.  Mild cardiomegaly.  Nonspecific bowel gas pattern with mucosal thickening involving multiple loops of small bowel in the mid abdomen.  The colon is not decompressed.  This appearance favors a small bowel enteritis and does not suggest small bowel obstruction.  No evidence of free air under the diaphragm on the upright view.  Degenerative changes of the visualized thoracolumbar spine.  IMPRESSION: Small right pleural effusion.  Mucosal thickening involving multiple small bowel loops, possibly reflecting small bowel enteritis.  No evidence of small bowel obstruction or free air.   Original Report Authenticated By: Charline Bills, M.D.     Medications: Scheduled Meds: . amLODipine  10 mg Oral Daily  . bicalutamide  50 mg Oral Daily  . enoxaparin (LOVENOX) injection  30 mg Subcutaneous Q24H  . finasteride  5 mg Oral Daily  . hydrALAZINE  25 mg Oral TID  . insulin aspart  0-5 Units Subcutaneous QHS  . insulin aspart  0-9 Units Subcutaneous TID WC  . magnesium oxide  400 mg Oral BID  . metoprolol  50 mg Oral BID  . metroNIDAZOLE  500 mg Oral Q8H  . simvastatin  20 mg Oral QHS  . tamsulosin  0.4 mg Oral Daily      LOS: 6 days   Treacy Holcomb M.D. [facility] 02/09/2013, 12:26 PM Pager: [phone]  If 7PM-7AM, please contact night-coverage [email or web address] Password [email or web address]

## 2013-02-10 LAB — GLUCOSE, CAPILLARY
Glucose-Capillary: 135 mg/dL — ABNORMAL HIGH (ref 70–99)
Glucose-Capillary: 168 mg/dL — ABNORMAL HIGH (ref 70–99)

## 2013-02-10 NOTE — Progress Notes (Signed)
Patient ID: [name]  male  ZOX:[id number]    DOB: 1935-04-25    DOA: 02/03/2013  PCP: Leo Grosser, MD   Interim summary Patient is a 78 year old male with multiple medical problems including metastatic prostrate cancer, CAD, hypertension presented with abdominal pain for 3-4 days prior to admission with no nausea vomiting. He also reported intermittent constitutional diarrhea but denied any fevers or chills. He had decreased by mouth intake in the last 3 days prior to admission. His lab work was significant for acute renal failure with creatinine of 6.94 and elevated Alk Phos of 1452. His abdominal KUB was consistent with enteritis. During hospitalization, renal ultrasound was obtained which showed bilateral hydronephrosis and new bladder mass. Renal (Dr Allena Katz) and urology (Dr Retta Diones) was consulted. The patient has a history of metastatic prostate cancer but had been noncompliant with his followup with urology. He refused a Foley catheter for initial 2 days of hospitalization. Per urology the bladder mass is almost certainly extension from his prostate CA. Patient agreed to have the Foley catheter placed on 3/10 by Dr Retta Diones and since then the creatinine function has been improving. Patient received his Lupron injection on 3/10, and was started on Casodex to continue for 3 weeks by urology. Patient underwent bone scan which showed diffuse bony metastasis. He received radiation oncology evaluation on 3/14 (see Dr Joellen Jersey note). He will start radiation treatments Mon-Fri tomorrow.  Consults:  Renal: Dr Allena Katz (signed off on 3/15) Urology: Dr Retta Diones Rad-Onc: Dr Mitzi Hansen  Assessment/Plan:    Acute renal failure on CKD stage III with metabolic acidosis likely from bilateral hydronephrosis, evident on the renal ultrasound: IMPROVING after Foley catheter placement - Cr function improving slowly, no acute needs of hemodialysis    Hydronephrosis, bilateral: Due to bladder mass and metastatic  prostate Ca - Bone scan showed a significant interval progression of the bone metastasis with multifocal abnormal areas of increased uptake in axial and appendicular skeleton - Received a Lupron injection, on Casodex, continue for 3 weeks - Dr. Retta Diones spoke with radiation oncology in regards to symptomatic lesions - Rad Onc eval reviewed, patient to start radiation treatments tomorrow - In regards to the bladder mass, per urology, no further evaluation needs to be done and focus on quality of life with regards to palliation of disease.    Enteritis: Tolerating diet - Repeat KUB was done which showed possible GE, partial small bowel obstruction is also possible - CDiff PCR negative, on Flagyl for now    HYPERLIPIDEMIA - on statin    Diabetes mellitus - Continue sliding scale insulin, changed diet to carb modified (patient is extremely upset with renal diet)  Right shoulder pain: Bone scan did show large lesion in the proximal right humerus which has increased in size from the previous exam, multiple metastasis throughout including left femur, sacrum metastasis likely the cause of his bilateral lower extremity pain and shoulder pain - pain controlled currently  DVT Prophylaxis:  Code Status:  Disposition: Transfer him to [facility], D/w Dr Retta Diones, who agrees with the plan. Due to his history of noncompliance and had been lost to followup, Social work is assisting with SNF placement. D/W Dr Darnelle Catalan at [facility].   Subjective: Extremely upset today about his renal diet, pain is otherwise controlled  Objective: Weight change:   Intake/Output Summary (Last 24 hours) at 02/10/13 1017 Last data filed at 02/10/13 0543  Gross per 24 hour  Intake   2519 ml  Output   2100 ml  Net    419 ml   Blood pressure 121/73, pulse 72, temperature 97.8 F (36.6 C), temperature source Oral, resp. rate 17, height 5\' 7"  (1.702 m), weight 89.359 kg (197 lb), SpO2 99.00%.  Physical Exam: General: NAD,  pain is controlled, comfortable but upset about her diet CVS: S1-S2 clear, no mrg Chest: CTAB Abdomen:  soft NT ,NBS Extremities: no c/c/e GU : foley+  Lab Results: Basic Metabolic Panel:  Recent Labs Lab 02/08/13 0450 02/09/13 0625 02/10/13 0435  NA 139 139  --   K 3.7 3.6  --   CL 98 99  --   CO2 32 32  --   GLUCOSE 185* 122*  --   BUN 45* 35*  --   CREATININE 4.77* 4.09* 4.02*  CALCIUM 7.8* 7.9*  --   MG 1.3*  --   --   PHOS 3.3 3.2  --    Liver Function Tests:  Recent Labs Lab 02/03/13 1603 02/08/13 0450 02/09/13 0625  AST 72*  --   --   ALT 12  --   --   ALKPHOS 1452*  --   --   BILITOT 0.4  --   --   PROT 6.8  --   --   ALBUMIN 3.4* 2.3* 2.2*   No results found for this basename: LIPASE, AMYLASE,  in the last 168 hours No results found for this basename: AMMONIA,  in the last 168 hours CBC:  Recent Labs Lab 02/03/13 1603 02/08/13 0450 02/09/13 0625  WBC 7.2 5.4 4.4  NEUTROABS 6.3  --   --   HGB 10.6* 8.6* 8.9*  HCT 30.5* 24.6* 25.3*  MCV 76.8* 76.6* 76.7*  PLT 277 201 202   Cardiac Enzymes: No results found for this basename: CKTOTAL, CKMB, CKMBINDEX, TROPONINI,  in the last 168 hours BNP: No components found with this basename: POCBNP,  CBG:  Recent Labs Lab 02/09/13 0736 02/09/13 1208 02/09/13 1732 02/09/13 2115 02/10/13 0759  GLUCAP 122* 94 119* 159* 135*     Micro Results: Recent Results (from the past 240 hour(s))  URINE CULTURE     Status: None   Collection Time    02/04/13  7:26 PM      Result Value Range Status   Specimen Description URINE, CATHETERIZED   Final   Special Requests Normal   Final   Culture  Setup Time 02/05/2013 03:04   Final   Colony Count NO GROWTH   Final   Culture NO GROWTH   Final   Report Status 02/05/2013 FINAL   Final  CLOSTRIDIUM DIFFICILE BY PCR     Status: None   Collection Time    02/08/13  5:20 PM      Result Value Range Status   C difficile by pcr NEGATIVE  NEGATIVE Final     Studies/Results: Ct Abdomen Pelvis Wo Contrast  02/03/2013  *RADIOLOGY REPORT*  Clinical Data: Abdominal pain, history of prostate cancer and chronic kidney disease  CT ABDOMEN AND PELVIS WITHOUT CONTRAST  Technique:  Multidetector CT imaging of the abdomen and pelvis was performed following the standard protocol without intravenous contrast.  Comparison: 02/03/2013 radiographs  Findings: Degraded by streak artifact due to extremity positioning. There are small bilateral pleural effusions.  Associated airspace opacities; atelectasis versus infiltrate.  Coronary artery calcification.  Heart size upper normal.  Small hiatal hernia.  Organ abnormality/lesion detection is limited in the absence of intravenous contrast. Within this limitation, unremarkable liver, spleen, pancreas, adrenal glands.  No  radiodense gallstones.  No biliary ductal dilatation.  Severe bilateral hydroureteronephrosis to the level of the UVJs.  Mild colonic diverticulosis.  No CT evidence for colitis or diverticulitis.  There is contrast within the right colon.  Small bowel loops are normal course and caliber.  No free intraperitoneal air or fluid.  Nonspecific retroperitoneal lymph nodes, measuring up to 12 mm short axis periaortic. Left external iliac chain lymph node measuring 10 mm short axis.  There is scattered atherosclerotic calcification of the aorta and its branches. No aneurysmal dilatation.  Distended bladder with bilateral diverticula.  Multilobulated filling defect along the bladder inferiorly.  Multiple sclerotic foci throughout the visualized bones.  No displaced fracture identified.  IMPRESSION: No bowel obstruction. Unenhanced CT has limited sensitivity for bowel ischemia detection.  No secondary signs such as bowel wall pneumatosis or focal/segmental thickening.  Severe bilateral hydroureteronephrosis and bladder distension. Multiloculated filling defect along the bladder wall inferiorly. Correlate with  urinalysis/cystoscopy.  May reflect prostate/bladder carcinoma and/or blood clots.  Evidence of metastatic disease with scattered sclerotic foci and retroperitoneal lymphadenopathy.  Small bilateral pleural effusions with associated airspace opacities; atelectasis versus infiltrate.   Original Report Authenticated By: Jearld Lesch, M.D.    US Renal  02/03/2013  *RADIOLOGY REPORT*  Clinical Data: Acute renal failure.  History of prostate cancer.  RENAL/URINARY TRACT ULTRASOUND COMPLETE  Comparison:  None.  Findings:  Right Kidney:  Measures 11.1 cm.  There is marked hydronephrosis. No stone or mass identified.  Left Kidney:  Measures 11.3 cm.  There is severe hydronephrosis. No stone or mass identified.  Bladder:  A large mass lesion is identified within the urinary bladder.  IMPRESSION: Severe bilateral hydronephrosis likely due to a large mass lesion in the urinary bladder.   Original Report Authenticated By: Holley Dexter, M.D.    Dg Chest Port 1 View  02/03/2013  *RADIOLOGY REPORT*  Clinical Data: Line placement.  PORTABLE CHEST - 1 VIEW  Comparison: Single view of the chest earlier this same date.  Findings: New right IJ catheter is in place.  Tip of the catheter is just within the right atrium and the line should be withdrawn 3 cm for better positioning.  No pneumothorax identified.  There is cardiomegaly and vascular congestion.  IMPRESSION:  1.  Tip of right IJ catheter projects just in the right atrium. The line should be withdrawn approximately 3 cm for better positioning.  No pneumothorax. 2.  Cardiomegaly and vascular congestion.   Original Report Authenticated By: Holley Dexter, M.D.    Dg Abd Acute W/chest  02/03/2013  *RADIOLOGY REPORT*  Clinical Data: Chronic shortness of breath, constipation, abdominal pain  ACUTE ABDOMEN SERIES (ABDOMEN 2 VIEW & CHEST 1 VIEW)  Comparison: Chest radiographs dated 05/25/2012  Findings: Lungs are essentially clear.  No focal consolidation. Small right  pleural effusion.  Mild cardiomegaly.  Nonspecific bowel gas pattern with mucosal thickening involving multiple loops of small bowel in the mid abdomen.  The colon is not decompressed.  This appearance favors a small bowel enteritis and does not suggest small bowel obstruction.  No evidence of free air under the diaphragm on the upright view.  Degenerative changes of the visualized thoracolumbar spine.  IMPRESSION: Small right pleural effusion.  Mucosal thickening involving multiple small bowel loops, possibly reflecting small bowel enteritis.  No evidence of small bowel obstruction or free air.   Original Report Authenticated By: Charline Bills, M.D.    Bone scan on 02/05/13  Multifocal abnormal areas of increased  radiotracer uptake are noted within the axial and appendicular skeleton consistent with prostate cancer metastasis. Compared with the previous exam there has been progression of prostate cancer metastasis. The large lesion within the proximal right proximal humeral has  increased in size compared with previous exam. There has also been increase in size of the proximal left femur lesion. Significant increase in size of sacral metastasis. Increase in the number of the thoracic and rib metastasis. There is faint physiologic tracer uptake noted within the kidneys and bladder.  IMPRESSION:  1. Significant interval progression of the bone metastases.  Medications: Scheduled Meds: . amLODipine  10 mg Oral Daily  . bicalutamide  50 mg Oral Daily  . enoxaparin (LOVENOX) injection  30 mg Subcutaneous Q24H  . finasteride  5 mg Oral Daily  . hydrALAZINE  25 mg Oral TID  . insulin aspart  0-5 Units Subcutaneous QHS  . insulin aspart  0-9 Units Subcutaneous TID WC  . magnesium oxide  400 mg Oral BID  . metoprolol  50 mg Oral BID  . metroNIDAZOLE  500 mg Oral Q8H  . simvastatin  20 mg Oral QHS  . tamsulosin  0.4 mg Oral Daily      LOS: 7 days   Eleanora Guinyard M.D. [facility] 02/10/2013, 10:17 AM Pager: [phone]  If 7PM-7AM, please contact night-coverage [email or web address] Password [email or web address]

## 2013-02-10 NOTE — Progress Notes (Signed)
Report called to Onalee Hua, RN for room [address] on [address] at [facility].  [facility] called.

## 2013-02-10 NOTE — Progress Notes (Signed)
Called Jola Babinski (family) and notified her of room # pt would be going to at [facility].  She will let daughters know.

## 2013-02-10 NOTE — Progress Notes (Signed)
[facility] in route to pick up pt, pt ready.

## 2013-02-10 NOTE — Progress Notes (Signed)
Pt for transfer to [facility] of [address].  Reviewed plan of care with pt who agrees.

## 2013-02-11 ENCOUNTER — Telehealth: Payer: Self-pay | Admitting: *Deleted

## 2013-02-11 ENCOUNTER — Encounter: Payer: Self-pay | Admitting: Radiation Oncology

## 2013-02-11 ENCOUNTER — Ambulatory Visit
Admit: 2013-02-11 | Discharge: 2013-02-11 | Disposition: A | Discharge status: Home / Self Care | Payer: Medicare Other | Attending: Radiation Oncology | Admitting: Radiation Oncology

## 2013-02-11 DIAGNOSIS — N139 Obstructive and reflux uropathy, unspecified: Secondary | ICD-10-CM

## 2013-02-11 DIAGNOSIS — C7952 Secondary malignant neoplasm of bone marrow: Secondary | ICD-10-CM

## 2013-02-11 DIAGNOSIS — E872 Acidosis: Secondary | ICD-10-CM | POA: Diagnosis present

## 2013-02-11 DIAGNOSIS — C7951 Secondary malignant neoplasm of bone: Secondary | ICD-10-CM

## 2013-02-11 LAB — GLUCOSE, CAPILLARY
Glucose-Capillary: 139 mg/dL — ABNORMAL HIGH (ref 70–99)
Glucose-Capillary: 90 mg/dL (ref 70–99)
Glucose-Capillary: 127 mg/dL — ABNORMAL HIGH (ref 70–99)
Glucose-Capillary: 198 mg/dL — ABNORMAL HIGH (ref 70–99)

## 2013-02-11 LAB — BASIC METABOLIC PANEL
Chloride: 95 mEq/L — ABNORMAL LOW (ref 96–112)
Creatinine, Ser: 4.02 mg/dL — ABNORMAL HIGH (ref 0.50–1.35)
GFR calc Af Amer: 15 mL/min — ABNORMAL LOW (ref >90)
GFR calc non Af Amer: 13 mL/min — ABNORMAL LOW (ref >90)
Potassium: 2.9 mEq/L — ABNORMAL LOW (ref 3.5–5.1)

## 2013-02-11 MED ORDER — POTASSIUM CHLORIDE CRYS ER 20 MEQ PO TBCR
20.0000 meq | EXTENDED_RELEASE_TABLET | Freq: Two times a day (BID) | ORAL | Status: DC
  Administered 2013-02-11 – 2013-02-13 (×5): 20 meq via ORAL
  Filled 2013-02-11 (×6): qty 1

## 2013-02-11 NOTE — Clinical Social Work Placement (Addendum)
    Clinical Social Work Department CLINICAL SOCIAL WORK PLACEMENT NOTE 02/11/2013  Patient:  [name], [name]  Account Number:  [id number] Admit date:  02/03/2013  Clinical Social Worker:  Hulan Fray  Date/time:  02/11/2013 09:15 AM  Clinical Social Work is seeking post-discharge placement for this patient at the following level of care:   SKILLED NURSING   (*CSW will update this form in Epic as items are completed)   02/07/2013  Patient/family provided with [facility] Department of Clinical Social Work's list of facilities offering this level of care within the geographic area requested by the patient (or if unable, by the patient's family).  02/07/2013  Patient/family informed of their freedom to choose among providers that offer the needed level of care, that participate in Medicare, Medicaid or managed care program needed by the patient, have an available bed and are willing to accept the patient.  02/07/2013  Patient/family informed of [facility]' ownership interest in [facility], as well as of the fact that they are under no obligation to receive care at this facility.  PASARR submitted to EDS on 02/11/2013 PASARR number received from EDS on 02/11/2013  FL2 transmitted to all facilities in geographic area requested by pt/family on  02/07/2013 FL2 transmitted to all facilities within larger geographic area on   Patient informed that his/her managed care company has contracts with or will negotiate with  certain facilities, including the following:     Patient/family informed of bed offers received:  02/08/2013 Patient chooses bed at [facility] Physician recommends and patient chooses bed at    Patient to be transferred to  on  [facility] on 02/13/2013 Patient to be transferred to facility by ambulance [name])  The following physician request were entered in Epic:   Additional Comments:  [name], MSW,  LCSWA  Clinical Social Work [phone]

## 2013-02-11 NOTE — Progress Notes (Signed)
Simulation Verification Note - right shoulder  The patient was brought to the treatment unit and placed in the planned treatment position. The clinical setup was verified. Then port films were obtained and uploaded to the radiation oncology medical record software.  The treatment beams were carefully compared against the planned radiation fields. The position location and shape of the radiation fields was reviewed. They targeted volume of tissue appears to be appropriately covered by the radiation beams. Organs at risk appear to be excluded as planned.  Based on my personal review, I approved the simulation verification. The patient's treatment will proceed as planned.  -----------------------------------  Lonie Peak, MD

## 2013-02-11 NOTE — Progress Notes (Addendum)
[facility] PROGRESS NOTE  [name] ZOX:[id number] DOB: 07/30/1935 DOA: 02/03/2013 PCP: Leo Grosser, MD  Brief narrative: [name] is an 78 y.o. male with multiple medical problems including metastatic prostrate cancer, CAD, hypertension who presented to the hospital 02/03/13 with abdominal pain for 3-4 days prior to admission.  Upon initial evaluation, he was found to have ARF with a creatinine of 6.94 and elevated Alk Phos of 1452. His abdominal KUB was consistent with enteritis. A renal ultrasound was obtained which showed bilateral hydronephrosis and new bladder mass. Renal (Dr Allena Katz) and urology (Dr Retta Diones) was consulted. Despite his history of known metastatic prostate cancer, he was lost to follow up. He refused a Foley catheter for initial 2 days of hospitalization. Per urology the bladder mass is almost certainly extension from his prostate CA. Patient agreed to have the Foley catheter placed on 3/10 by Dr Retta Diones and since then the creatinine function has been improving. Patient received his Lupron injection on 3/10, and was started on Casodex to continue for 3 weeks by urology. Patient underwent bone scan which showed diffuse bony metastasis. He is S/P radiation oncology evaluation on 02/08/13 and is scheduled to begin XRT 02/11/13.   Assessment/Plan: Principal Problem:   Acute renal failure with metabolic acidosis / bilateral hydronephrosis, secondary to obstructive uropathy from prostate cancer -Creatinine improving status post foley placement.  -Acidosis resolved. Active Problems:   Microcytic anemia -Fecal occult blood negative. -Likely anemia of chronic disease.  No current indication for transfusion.   Hypokalemia / Hypomagnesemia -Monitor and replace electrolytes as needed.   Enteritis:  -Tolerating diet  - Repeat KUB was done which showed possible GE, partial small bowel obstruction.  - CDiff PCR negative, on Flagyl.   HYPERLIPIDEMIA -Continue  statin.   Diabetes mellitus -Continue SSI. -Hemoglobin A1c 7.2%. -CBGs 112-168. -Will liberalize diet to help with intake and comfort.   Prostate cancer metastatic to bone - Bone scan showed a significant interval progression of the bone metastasis with multifocal abnormal areas of increased uptake in axial and appendicular skeleton.  - Received a Lupron injection, now on Casodex, continue for 3 weeks per urology recommendations.  - Status post evaluation by Dr. Mitzi Hansen for consideration of XRT, for palliative XRT 02/11/13.  8 fractions planned to right femur. - In regards to the bladder mass, per urology, no further evaluation needs to be done and focus on quality of life with regards to palliation of disease.    Code Status: Full. Family Communication: None at bedside. Disposition Plan: SNF.   Medical Consultants:  Dr. Zetta Bills, Nephrology  Dr. Patsi Sears, Urology.  Dr. Jonna Coup, Radiation Oncology.  Other Consultants:  Physical therapy  Anti-infectives:  Flagyl 02/08/13--->  Zosyn 02/03/13--->02/03/13  HPI/Subjective: [name] is having some frequent loose stools but only passing small amounts at a time. Some occasional nausea and vomiting. Overall, pain is reasonably well-controlled. No dyspnea or cough.  Objective: Filed Vitals:   02/10/13 1529 02/10/13 2130 02/10/13 2301 02/11/13 0500  BP: 113/82 107/56 120/52 105/53  Pulse: 80 67 70 78  Temp: 97.7 F (36.5 C) 97.9 F (36.6 C)  97.8 F (36.6 C)  TempSrc: Oral Oral  Oral  Resp: 18 16  16   Height:      Weight:      SpO2: 100% 97%  98%    Intake/Output Summary (Last 24 hours) at 02/11/13 0749 Last data filed at 02/11/13 0528  Gross per 24 hour  Intake    360 ml  Output   1003 ml  Net   -643 ml    Exam: Gen:  NAD Cardiovascular:  RRR, No M/R/G Respiratory:  Lungs CTAB Gastrointestinal:  Abdomen soft, NT/ND, + BS Extremities:  No C/E/C  Data Reviewed: Basic Metabolic  Panel:  Recent Labs Lab 02/06/13 0642 02/07/13 0835 02/08/13 0450 02/09/13 0625 02/10/13 0435 02/11/13 0353  NA 140 140 139 139  --  138  K 4.4 3.9 3.7 3.6  --  2.9*  CL 103 100 98 99  --  95*  CO2 23 31 32 32  --  34*  GLUCOSE 140* 181* 185* 122*  --  120*  BUN 54* 48* 45* 35*  --  31*  CREATININE 5.99* 5.27* 4.77* 4.09* 4.02* 4.02*  CALCIUM 7.9* 7.8* 7.8* 7.9*  --  8.0*  MG  --   --  1.3*  --   --   --   PHOS  --   --  3.3 3.2  --   --    GFR Estimated Creatinine Clearance: 16.9 ml/min (by C-G formula based on Cr of 4.02). Liver Function Tests:  Recent Labs Lab 02/08/13 0450 02/09/13 0625  ALBUMIN 2.3* 2.2*   Coagulation profile  Recent Labs Lab 02/08/13 0450  INR 1.06    CBC:  Recent Labs Lab 02/08/13 0450 02/09/13 0625  WBC 5.4 4.4  HGB 8.6* 8.9*  HCT 24.6* 25.3*  MCV 76.6* 76.7*  PLT 201 202   CBG:  Recent Labs Lab 02/09/13 1732 02/09/13 2115 02/10/13 0759 02/10/13 1711 02/10/13 2130  GLUCAP 119* 159* 135* 168* 112*   Microbiology Recent Results (from the past 240 hour(s))  URINE CULTURE     Status: None   Collection Time    02/04/13  7:26 PM      Result Value Range Status   Specimen Description URINE, CATHETERIZED   Final   Special Requests Normal   Final   Culture  Setup Time 02/05/2013 03:04   Final   Colony Count NO GROWTH   Final   Culture NO GROWTH   Final   Report Status 02/05/2013 FINAL   Final  CLOSTRIDIUM DIFFICILE BY PCR     Status: None   Collection Time    02/08/13  5:20 PM      Result Value Range Status   C difficile by pcr NEGATIVE  NEGATIVE Final     Procedures and Diagnostic Studies: Ct Abdomen Pelvis Wo Contrast  02/03/2013  *RADIOLOGY REPORT*  Clinical Data: Abdominal pain, history of prostate cancer and chronic kidney disease  CT ABDOMEN AND PELVIS WITHOUT CONTRAST  Technique:  Multidetector CT imaging of the abdomen and pelvis was performed following the standard protocol without intravenous contrast.   Comparison: 02/03/2013 radiographs  Findings: Degraded by streak artifact due to extremity positioning. There are small bilateral pleural effusions.  Associated airspace opacities; atelectasis versus infiltrate.  Coronary artery calcification.  Heart size upper normal.  Small hiatal hernia.  Organ abnormality/lesion detection is limited in the absence of intravenous contrast. Within this limitation, unremarkable liver, spleen, pancreas, adrenal glands.  No radiodense gallstones.  No biliary ductal dilatation.  Severe bilateral hydroureteronephrosis to the level of the UVJs.  Mild colonic diverticulosis.  No CT evidence for colitis or diverticulitis.  There is contrast within the right colon.  Small bowel loops are normal course and caliber.  No free intraperitoneal air or fluid.  Nonspecific retroperitoneal lymph nodes, measuring up to 12 mm short axis periaortic. Left external iliac chain lymph  node measuring 10 mm short axis.  There is scattered atherosclerotic calcification of the aorta and its branches. No aneurysmal dilatation.  Distended bladder with bilateral diverticula.  Multilobulated filling defect along the bladder inferiorly.  Multiple sclerotic foci throughout the visualized bones.  No displaced fracture identified.  IMPRESSION: No bowel obstruction. Unenhanced CT has limited sensitivity for bowel ischemia detection.  No secondary signs such as bowel wall pneumatosis or focal/segmental thickening.  Severe bilateral hydroureteronephrosis and bladder distension. Multiloculated filling defect along the bladder wall inferiorly. Correlate with urinalysis/cystoscopy.  May reflect prostate/bladder carcinoma and/or blood clots.  Evidence of metastatic disease with scattered sclerotic foci and retroperitoneal lymphadenopathy.  Small bilateral pleural effusions with associated airspace opacities; atelectasis versus infiltrate.   Original Report Authenticated By: Jearld Lesch, M.D.    Nm Bone Scan Whole  Body  02/05/2013  *RADIOLOGY REPORT*  Clinical Data: Follow up prostate cancer metastasis  NUCLEAR MEDICINE WHOLE BODY BONE SCINTIGRAPHY  Technique:  Whole body anterior and posterior images were obtained approximately 3 hours after intravenous injection of radiopharmaceutical.  Radiopharmaceutical: CURIE TC-MDP TECHNETIUM TC 59M MEDRONATE IV KIT  Comparison: 02/09/2012  Findings: Multifocal abnormal areas of increased radiotracer uptake are noted within the axial and appendicular skeleton consistent with prostate cancer metastasis. Compared with the previous exam there has been progression of prostate cancer metastasis.  The large lesion within the proximal right proximal humeral has increased in size compared with previous exam. There has also been increase in size of the proximal left femur lesion. Significant increase in size of sacral metastasis.  Increase in the number of the thoracic and rib metastasis.  There is faint physiologic tracer uptake noted within the kidneys and bladder.  IMPRESSION:  1.  Significant interval progression of the bone metastases.   Original Report Authenticated By: Signa Kell, M.D.    US Renal  02/07/2013  *RADIOLOGY REPORT*  Clinical Data: Follow up bladder decompression  RENAL/URINARY TRACT ULTRASOUND COMPLETE  Comparison:  02/03/2013  Findings:  Right Kidney:  Measures 10.6 cm.  Echogenic renal parenchyma, suggesting medical renal disease.  11 x 5 x 9 mm interpolar cyst. Mild hydronephrosis.  Left Kidney:  Measures 11.6 cm.  Echogenic renal parenchyma, suggesting medical renal disease.  Moderate hydronephrosis.  Bladder:  7.3 x 7.6 x 8.1 cm heterogeneous mass/debris within an otherwise decompressed bladder with indwelling Foley catheter.  IMPRESSION: Mild right and moderate left hydronephrosis.  Stable bladder mass/debris.  Indwelling Foley catheter.   Original Report Authenticated By: Charline Bills, M.D.    US Renal  02/03/2013  *RADIOLOGY REPORT*  Clinical  Data: Acute renal failure.  History of prostate cancer.  RENAL/URINARY TRACT ULTRASOUND COMPLETE  Comparison:  None.  Findings:  Right Kidney:  Measures 11.1 cm.  There is marked hydronephrosis. No stone or mass identified.  Left Kidney:  Measures 11.3 cm.  There is severe hydronephrosis. No stone or mass identified.  Bladder:  A large mass lesion is identified within the urinary bladder.  IMPRESSION: Severe bilateral hydronephrosis likely due to a large mass lesion in the urinary bladder.   Original Report Authenticated By: Holley Dexter, M.D.    Dg Chest Port 1 View  02/03/2013  *RADIOLOGY REPORT*  Clinical Data: Line placement.  PORTABLE CHEST - 1 VIEW  Comparison: Single view of the chest earlier this same date.  Findings: New right IJ catheter is in place.  Tip of the catheter is just within the right atrium and the line should be withdrawn 3 cm for  better positioning.  No pneumothorax identified.  There is cardiomegaly and vascular congestion.  IMPRESSION:  1.  Tip of right IJ catheter projects just in the right atrium. The line should be withdrawn approximately 3 cm for better positioning.  No pneumothorax. 2.  Cardiomegaly and vascular congestion.   Original Report Authenticated By: Holley Dexter, M.D.    Dg Abd Acute W/chest  02/03/2013  *RADIOLOGY REPORT*  Clinical Data: Chronic shortness of breath, constipation, abdominal pain  ACUTE ABDOMEN SERIES (ABDOMEN 2 VIEW & CHEST 1 VIEW)  Comparison: Chest radiographs dated 05/25/2012  Findings: Lungs are essentially clear.  No focal consolidation. Small right pleural effusion.  Mild cardiomegaly.  Nonspecific bowel gas pattern with mucosal thickening involving multiple loops of small bowel in the mid abdomen.  The colon is not decompressed.  This appearance favors a small bowel enteritis and does not suggest small bowel obstruction.  No evidence of free air under the diaphragm on the upright view.  Degenerative changes of the visualized thoracolumbar  spine.  IMPRESSION: Small right pleural effusion.  Mucosal thickening involving multiple small bowel loops, possibly reflecting small bowel enteritis.  No evidence of small bowel obstruction or free air.   Original Report Authenticated By: Charline Bills, M.D.    Dg Abd Portable 2v  02/07/2013  *RADIOLOGY REPORT*  Clinical Data: Enteritis  PORTABLE ABDOMEN - 2 VIEW  Comparison: CT 02/03/2013, KUB 02/03/2013  Findings: Mildly distended small bowel loops, similar to the prior study.  No air-fluid levels on the decubitus view.  There is gas in nondilated colon.  Air-fluid levels are not prominent on the decubitus view.  No free air.  IMPRESSION: No significant change.  Possible gastroenteritis.  Partial small bowel obstruction also possible.   Original Report Authenticated By: Janeece Riggers, M.D.     Scheduled Meds: . amLODipine  10 mg Oral Daily  . bicalutamide  50 mg Oral Daily  . enoxaparin (LOVENOX) injection  30 mg Subcutaneous Q24H  . finasteride  5 mg Oral Daily  . hydrALAZINE  25 mg Oral TID  . insulin aspart  0-5 Units Subcutaneous QHS  . insulin aspart  0-9 Units Subcutaneous TID WC  . magnesium oxide  400 mg Oral BID  . metoprolol  50 mg Oral BID  . metroNIDAZOLE  500 mg Oral Q8H  . simvastatin  20 mg Oral QHS  . tamsulosin  0.4 mg Oral Daily   Continuous Infusions: .  sodium bicarbonate  infusion 1000 mL 75 mL/hr at 02/11/13 0334    Time spent: 25 minutes.   LOS: 8 days   RAMA,CHRISTINA  [facility] Pager [phone].  If 8PM-8AM, please contact night-coverage at [email or web address], password [facility] 02/11/2013, 7:49 AM

## 2013-02-11 NOTE — Clinical Social Work Note (Signed)
Unit 6N CSW will sign off. CSW provided report to 3 Chad CSW at [facility] and will continue to follow for SNF placement at discharge.   Rozetta Nunnery MSW, [employer] [phone]

## 2013-02-11 NOTE — Progress Notes (Signed)
Simulation Verification Note - left femur  INPATIENT  The patient was brought to the treatment unit and placed in the planned treatment position. The clinical setup was verified. Then port films were obtained and uploaded to the radiation oncology medical record software.  The treatment beams were carefully compared against the planned radiation fields. The position location and shape of the radiation fields was reviewed. They targeted volume of tissue appears to be appropriately covered by the radiation beams. Organs at risk appear to be excluded as planned.  Based on my personal review, I approved the simulation verification. The patient's treatment will proceed as planned.  -----------------------------------  Lonie Peak, MD

## 2013-02-11 NOTE — Progress Notes (Signed)
CSW received report from [place] Judkins, [place] from [place] as pt transferred from [facility] today.  CSW reviewed chart and noted that pt was evaluated for radiation treatment on 02/08/2013 and was set to begin radiation treatment today 02/11/2013.  CSW re-initiated SNF search with information about radiation treatment plan.  CSW attempted to meet with pt at bedside to update pt on SNF bed offers, however pt was being transported to radiation treatment at that time.  CSW contacted pt daughter, [name] via telephone to discuss. CSW discussed need to update facilities about pt radiation treatment plan and that new bed offers may differ than initial bed offers that were provided to pt and pt daughter and pt daughter expressed understanding.   Pt daughter request that bed offers be sent via e-mail.  CSW sent current bed offers for SNF via e-mail to pt daughter.  CSW to follow up with pt and pt daughter tomorrow and update bed offers if any further facilities respond.  CSW to continue to follow to assist with pt discharge needs to SNF when pt medically stable for discharge.  [name], MSW, LCSWA  Clinical Social Work [phone]

## 2013-02-11 NOTE — Telephone Encounter (Signed)
Called the floor asking to speak with patients nurse room 1336,per Derrek Monaco is off the floor at present, asked to inform nurse we will get patient around 415pm thursday for port and treat in rad onc, can have kimberly call us at [phone] and ask for Val Malloy,RN,if any questions 1:56 PM

## 2013-02-11 NOTE — Progress Notes (Signed)
PT Cancellation Note  Patient Details Name: [name] MRN: [id number] DOB: Apr 24, 1935   Cancelled Treatment:    Reason Eval/Treat Not Completed: Medical issues which prohibited therapy (pt stated he feels nauseous. Will follow. )   Tamala Ser 02/11/2013, 9:37 AM [phone]

## 2013-02-12 ENCOUNTER — Other Ambulatory Visit: Payer: Medicare Other

## 2013-02-12 ENCOUNTER — Ambulatory Visit
Admit: 2013-02-12 | Discharge: 2013-02-12 | Disposition: A | Discharge status: Home / Self Care | Payer: Medicare Other | Attending: Radiation Oncology | Admitting: Radiation Oncology

## 2013-02-12 LAB — BASIC METABOLIC PANEL
GFR calc Af Amer: 16 mL/min — ABNORMAL LOW (ref >90)
GFR calc non Af Amer: 14 mL/min — ABNORMAL LOW (ref >90)
Glucose, Bld: 160 mg/dL — ABNORMAL HIGH (ref 70–99)
Potassium: 3.4 mEq/L — ABNORMAL LOW (ref 3.5–5.1)
Sodium: 138 mEq/L (ref 135–145)

## 2013-02-12 LAB — MAGNESIUM: Magnesium: 1.4 mg/dL — ABNORMAL LOW (ref 1.5–2.5)

## 2013-02-12 LAB — GLUCOSE, CAPILLARY: Glucose-Capillary: 106 mg/dL — ABNORMAL HIGH (ref 70–99)

## 2013-02-12 MED ORDER — MORPHINE SULFATE 4 MG/ML IJ SOLN
4.0000 mg | INTRAMUSCULAR | Status: DC | PRN
  Administered 2013-02-12 – 2013-02-13 (×2): 4 mg via INTRAVENOUS
  Filled 2013-02-12 (×2): qty 1

## 2013-02-12 NOTE — Progress Notes (Signed)
[name], [place] [phone] 02/12/2013

## 2013-02-12 NOTE — Progress Notes (Signed)
[facility] PROGRESS NOTE  Levander Katzenstein QIO:[id number] DOB: 10/23/1935 DOA: 02/03/2013 PCP: Leo Grosser, MD  Brief narrative: [name] is an 78 y.o. male with multiple medical problems including metastatic prostrate cancer, CAD, hypertension who presented to the hospital 02/03/13 with abdominal pain for 3-4 days prior to admission.  Upon initial evaluation, he was found to have ARF with a creatinine of 6.94 and elevated Alk Phos of 1452. His abdominal KUB was consistent with enteritis. A renal ultrasound was obtained which showed bilateral hydronephrosis and new bladder mass. Renal (Dr Allena Katz) and urology (Dr Retta Diones) was consulted. Despite his history of known metastatic prostate cancer, he was lost to follow up. He refused a Foley catheter for initial 2 days of hospitalization. Per urology the bladder mass is almost certainly extension from his prostate CA. Patient agreed to have the Foley catheter placed on 3/10 by Dr Retta Diones and since then the creatinine function has been improving. Patient received his Lupron injection on 3/10, and was started on Casodex to continue for 3 weeks by urology. Patient underwent bone scan which showed diffuse bony metastasis. He is S/P radiation oncology evaluation on 02/08/13 and began radiation treatments on 02/11/2013.  Assessment/Plan: Principal Problem:   Acute renal failure with metabolic acidosis / bilateral hydronephrosis, secondary to obstructive uropathy from prostate cancer -Creatinine improving status post foley placement.  -Acidosis resolved. Active Problems:   Microcytic anemia -Fecal occult blood negative. -Likely anemia of chronic disease.  No current indication for transfusion.   Hypokalemia / Hypomagnesemia -Monitor and replace electrolytes as needed.   Enteritis:  -Tolerating diet  - Repeat KUB was done which showed possible GE, partial small bowel obstruction.  - CDiff PCR negative, on Flagyl.   HYPERLIPIDEMIA -Continue  statin.   Diabetes mellitus -Continue SSI. -Hemoglobin A1c 7.2%. -CBGs 90-198. -Will liberalize diet to help with intake and comfort.   Prostate cancer metastatic to bone - Bone scan showed a significant interval progression of the bone metastasis with multifocal abnormal areas of increased uptake in axial and appendicular skeleton.  - Received a Lupron injection, now on Casodex, continue for 3 weeks per urology recommendations.  - Begin radiation treatment 02/12/1999 1400 the care of Dr. Mitzi Hansen.  8 fractions planned to right femur. - In regards to the bladder mass, per urology, no further evaluation needs to be done and focus on quality of life with regards to palliation of disease.    Code Status: Full. Family Communication: None at bedside. Disposition Plan: SNF.   Medical Consultants:  Dr. Zetta Bills, Nephrology  Dr. Patsi Sears, Urology.  Dr. Jonna Coup, Radiation Oncology.  Other Consultants:  Physical therapy  Anti-infectives:  Flagyl 02/08/13--->  Zosyn 02/03/13--->02/03/13  HPI/Subjective: Jevaughn Degollado is without current complaints, but does have some pain at times. No nausea or vomiting.  Objective: Filed Vitals:   02/11/13 0500 02/11/13 1317 02/11/13 2100 02/12/13 0547  BP: 105/53 98/63 137/71 127/81  Pulse: 78 63 78 79  Temp: 97.8 F (36.6 C) 98 F (36.7 C) 97.6 F (36.4 C) 97.9 F (36.6 C)  TempSrc: Oral Oral Oral Oral  Resp: 16 14 20 18   Height:      Weight:      SpO2: 98% 98% 100% 93%    Intake/Output Summary (Last 24 hours) at 02/12/13 1131 Last data filed at 02/12/13 0700  Gross per 24 hour  Intake    680 ml  Output    200 ml  Net    480 ml  Exam: Gen:  NAD Cardiovascular:  RRR, No M/R/G Respiratory:  Lungs CTAB Gastrointestinal:  Abdomen soft, NT/ND, + BS Extremities:  No C/E/C  Data Reviewed: Basic Metabolic Panel:  Recent Labs Lab 02/07/13 0835 02/08/13 0450 02/09/13 0625 02/10/13 0435 02/11/13 0353  02/12/13 0530  NA 140 139 139  --  138 138  K 3.9 3.7 3.6  --  2.9* 3.4*  CL 100 98 99  --  95* 94*  CO2 31 32 32  --  34* 37*  GLUCOSE 181* 185* 122*  --  120* 160*  BUN 48* 45* 35*  --  31* 29*  CREATININE 5.27* 4.77* 4.09* 4.02* 4.02* 3.91*  CALCIUM 7.8* 7.8* 7.9*  --  8.0* 8.2*  MG  --  1.3*  --   --   --  1.4*  PHOS  --  3.3 3.2  --   --   --    GFR Estimated Creatinine Clearance: 17.4 ml/min (by C-G formula based on Cr of 3.91). Liver Function Tests:  Recent Labs Lab 02/08/13 0450 02/09/13 0625  ALBUMIN 2.3* 2.2*   Coagulation profile  Recent Labs Lab 02/08/13 0450  INR 1.06    CBC:  Recent Labs Lab 02/08/13 0450 02/09/13 0625  WBC 5.4 4.4  HGB 8.6* 8.9*  HCT 24.6* 25.3*  MCV 76.6* 76.7*  PLT 201 202   CBG:  Recent Labs Lab 02/11/13 0753 02/11/13 1128 02/11/13 1757 02/11/13 2104 02/12/13 0741  GLUCAP 139* 90 127* 198* 155*   Microbiology Recent Results (from the past 240 hour(s))  URINE CULTURE     Status: None   Collection Time    02/04/13  7:26 PM      Result Value Range Status   Specimen Description URINE, CATHETERIZED   Final   Special Requests Normal   Final   Culture  Setup Time 02/05/2013 03:04   Final   Colony Count NO GROWTH   Final   Culture NO GROWTH   Final   Report Status 02/05/2013 FINAL   Final  CLOSTRIDIUM DIFFICILE BY PCR     Status: None   Collection Time    02/08/13  5:20 PM      Result Value Range Status   C difficile by pcr NEGATIVE  NEGATIVE Final     Procedures and Diagnostic Studies: Ct Abdomen Pelvis Wo Contrast  02/03/2013  *RADIOLOGY REPORT*  Clinical Data: Abdominal pain, history of prostate cancer and chronic kidney disease  CT ABDOMEN AND PELVIS WITHOUT CONTRAST  Technique:  Multidetector CT imaging of the abdomen and pelvis was performed following the standard protocol without intravenous contrast.  Comparison: 02/03/2013 radiographs  Findings: Degraded by streak artifact due to extremity positioning.  There are small bilateral pleural effusions.  Associated airspace opacities; atelectasis versus infiltrate.  Coronary artery calcification.  Heart size upper normal.  Small hiatal hernia.  Organ abnormality/lesion detection is limited in the absence of intravenous contrast. Within this limitation, unremarkable liver, spleen, pancreas, adrenal glands.  No radiodense gallstones.  No biliary ductal dilatation.  Severe bilateral hydroureteronephrosis to the level of the UVJs.  Mild colonic diverticulosis.  No CT evidence for colitis or diverticulitis.  There is contrast within the right colon.  Small bowel loops are normal course and caliber.  No free intraperitoneal air or fluid.  Nonspecific retroperitoneal lymph nodes, measuring up to 12 mm short axis periaortic. Left external iliac chain lymph node measuring 10 mm short axis.  There is scattered atherosclerotic calcification of the aorta and  its branches. No aneurysmal dilatation.  Distended bladder with bilateral diverticula.  Multilobulated filling defect along the bladder inferiorly.  Multiple sclerotic foci throughout the visualized bones.  No displaced fracture identified.  IMPRESSION: No bowel obstruction. Unenhanced CT has limited sensitivity for bowel ischemia detection.  No secondary signs such as bowel wall pneumatosis or focal/segmental thickening.  Severe bilateral hydroureteronephrosis and bladder distension. Multiloculated filling defect along the bladder wall inferiorly. Correlate with urinalysis/cystoscopy.  May reflect prostate/bladder carcinoma and/or blood clots.  Evidence of metastatic disease with scattered sclerotic foci and retroperitoneal lymphadenopathy.  Small bilateral pleural effusions with associated airspace opacities; atelectasis versus infiltrate.   Original Report Authenticated By: Jearld Lesch, M.D.    Nm Bone Scan Whole Body  02/05/2013  *RADIOLOGY REPORT*  Clinical Data: Follow up prostate cancer metastasis  NUCLEAR  MEDICINE WHOLE BODY BONE SCINTIGRAPHY  Technique:  Whole body anterior and posterior images were obtained approximately 3 hours after intravenous injection of radiopharmaceutical.  Radiopharmaceutical: CURIE TC-MDP TECHNETIUM TC 69M MEDRONATE IV KIT  Comparison: 02/09/2012  Findings: Multifocal abnormal areas of increased radiotracer uptake are noted within the axial and appendicular skeleton consistent with prostate cancer metastasis. Compared with the previous exam there has been progression of prostate cancer metastasis.  The large lesion within the proximal right proximal humeral has increased in size compared with previous exam. There has also been increase in size of the proximal left femur lesion. Significant increase in size of sacral metastasis.  Increase in the number of the thoracic and rib metastasis.  There is faint physiologic tracer uptake noted within the kidneys and bladder.  IMPRESSION:  1.  Significant interval progression of the bone metastases.   Original Report Authenticated By: Signa Kell, M.D.    US Renal  02/07/2013  *RADIOLOGY REPORT*  Clinical Data: Follow up bladder decompression  RENAL/URINARY TRACT ULTRASOUND COMPLETE  Comparison:  02/03/2013  Findings:  Right Kidney:  Measures 10.6 cm.  Echogenic renal parenchyma, suggesting medical renal disease.  11 x 5 x 9 mm interpolar cyst. Mild hydronephrosis.  Left Kidney:  Measures 11.6 cm.  Echogenic renal parenchyma, suggesting medical renal disease.  Moderate hydronephrosis.  Bladder:  7.3 x 7.6 x 8.1 cm heterogeneous mass/debris within an otherwise decompressed bladder with indwelling Foley catheter.  IMPRESSION: Mild right and moderate left hydronephrosis.  Stable bladder mass/debris.  Indwelling Foley catheter.   Original Report Authenticated By: Charline Bills, M.D.    US Renal  02/03/2013  *RADIOLOGY REPORT*  Clinical Data: Acute renal failure.  History of prostate cancer.  RENAL/URINARY TRACT ULTRASOUND COMPLETE   Comparison:  None.  Findings:  Right Kidney:  Measures 11.1 cm.  There is marked hydronephrosis. No stone or mass identified.  Left Kidney:  Measures 11.3 cm.  There is severe hydronephrosis. No stone or mass identified.  Bladder:  A large mass lesion is identified within the urinary bladder.  IMPRESSION: Severe bilateral hydronephrosis likely due to a large mass lesion in the urinary bladder.   Original Report Authenticated By: Holley Dexter, M.D.    Dg Chest Port 1 View  02/03/2013  *RADIOLOGY REPORT*  Clinical Data: Line placement.  PORTABLE CHEST - 1 VIEW  Comparison: Single view of the chest earlier this same date.  Findings: New right IJ catheter is in place.  Tip of the catheter is just within the right atrium and the line should be withdrawn 3 cm for better positioning.  No pneumothorax identified.  There is cardiomegaly and vascular congestion.  IMPRESSION:  1.  Tip of right IJ catheter projects just in the right atrium. The line should be withdrawn approximately 3 cm for better positioning.  No pneumothorax. 2.  Cardiomegaly and vascular congestion.   Original Report Authenticated By: Holley Dexter, M.D.    Dg Abd Acute W/chest  02/03/2013  *RADIOLOGY REPORT*  Clinical Data: Chronic shortness of breath, constipation, abdominal pain  ACUTE ABDOMEN SERIES (ABDOMEN 2 VIEW & CHEST 1 VIEW)  Comparison: Chest radiographs dated 05/25/2012  Findings: Lungs are essentially clear.  No focal consolidation. Small right pleural effusion.  Mild cardiomegaly.  Nonspecific bowel gas pattern with mucosal thickening involving multiple loops of small bowel in the mid abdomen.  The colon is not decompressed.  This appearance favors a small bowel enteritis and does not suggest small bowel obstruction.  No evidence of free air under the diaphragm on the upright view.  Degenerative changes of the visualized thoracolumbar spine.  IMPRESSION: Small right pleural effusion.  Mucosal thickening involving multiple small  bowel loops, possibly reflecting small bowel enteritis.  No evidence of small bowel obstruction or free air.   Original Report Authenticated By: Charline Bills, M.D.    Dg Abd Portable 2v  02/07/2013  *RADIOLOGY REPORT*  Clinical Data: Enteritis  PORTABLE ABDOMEN - 2 VIEW  Comparison: CT 02/03/2013, KUB 02/03/2013  Findings: Mildly distended small bowel loops, similar to the prior study.  No air-fluid levels on the decubitus view.  There is gas in nondilated colon.  Air-fluid levels are not prominent on the decubitus view.  No free air.  IMPRESSION: No significant change.  Possible gastroenteritis.  Partial small bowel obstruction also possible.   Original Report Authenticated By: Janeece Riggers, M.D.     Scheduled Meds: . amLODipine  10 mg Oral Daily  . bicalutamide  50 mg Oral Daily  . enoxaparin (LOVENOX) injection  30 mg Subcutaneous Q24H  . finasteride  5 mg Oral Daily  . hydrALAZINE  25 mg Oral TID  . insulin aspart  0-5 Units Subcutaneous QHS  . insulin aspart  0-9 Units Subcutaneous TID WC  . magnesium oxide  400 mg Oral BID  . metoprolol  50 mg Oral BID  . metroNIDAZOLE  500 mg Oral Q8H  . potassium chloride  20 mEq Oral BID  . simvastatin  20 mg Oral QHS  . tamsulosin  0.4 mg Oral Daily   Continuous Infusions: .  sodium bicarbonate  infusion 1000 mL 75 mL/hr at 02/12/13 0937    Time spent: 25 minutes.   LOS: 9 days   Jamai Dolce  [facility] Pager [phone].  If 8PM-8AM, please contact night-coverage at [email or web address], password [facility] 02/12/2013, 11:31 AM

## 2013-02-12 NOTE — Progress Notes (Signed)
PT Cancellation Note  Patient Details Name: [name] MRN: [id number] DOB: 1935-11-07   Cancelled Treatment:    Reason Eval/Treat Not Completed: Fatigue/lethargy limiting ability to participate;Other (comment) stated " I just don't feel up to it"   Rada Hay 02/12/2013, 2:52 PM Blanchard Kelch PT

## 2013-02-13 ENCOUNTER — Ambulatory Visit
Admit: 2013-02-13 | Discharge: 2013-02-13 | Disposition: A | Discharge status: Home / Self Care | Payer: Medicare Other | Attending: Radiation Oncology | Admitting: Radiation Oncology

## 2013-02-13 LAB — GLUCOSE, CAPILLARY: Glucose-Capillary: 120 mg/dL — ABNORMAL HIGH (ref 70–99)

## 2013-02-13 LAB — BASIC METABOLIC PANEL
BUN: 24 mg/dL — ABNORMAL HIGH (ref 6–23)
CO2: 37 mEq/L — ABNORMAL HIGH (ref 19–32)
Calcium: 8.7 mg/dL (ref 8.4–10.5)
Chloride: 93 mEq/L — ABNORMAL LOW (ref 96–112)
Creatinine, Ser: 3.66 mg/dL — ABNORMAL HIGH (ref 0.50–1.35)
Glucose, Bld: 137 mg/dL — ABNORMAL HIGH (ref 70–99)

## 2013-02-13 MED ORDER — FINASTERIDE 5 MG PO TABS: 5.0000 mg | ORAL_TABLET | Freq: Every day | ORAL | Status: DC

## 2013-02-13 MED ORDER — MAGNESIUM OXIDE 400 (241.3 MG) MG PO TABS: 400.0000 mg | ORAL_TABLET | Freq: Every day | ORAL | Status: DC

## 2013-02-13 MED ORDER — OXYCODONE HCL 5 MG PO TABS: 5.0000 mg | ORAL_TABLET | Freq: Four times a day (QID) | ORAL | Status: DC | PRN

## 2013-02-13 MED ORDER — ONDANSETRON HCL 4 MG PO TABS
4.0000 mg | ORAL_TABLET | ORAL | Status: AC
  Administered 2013-02-13: 4 mg via ORAL
  Filled 2013-02-13: qty 1

## 2013-02-13 MED ORDER — BICALUTAMIDE 50 MG PO TABS: 50.0000 mg | ORAL_TABLET | Freq: Every day | ORAL | Status: AC

## 2013-02-13 NOTE — Progress Notes (Signed)
CSW received notification from MD that pt likely medically ready for d/c today.  CSW met with pt at bedside to discuss SNF bed offers.   Pt states that he is not familiar with SNF facilities and would be fine with pt daughter, [name] determining which SNF pt goes to.   CSW contacted pt daughter, [name] via telephone to discuss. Pt daughter chooses bed at [facility].   CSW contacted facility and confirmed bed availability for today.   CSW notified MD.  CSW to facilitate pt discharge needs this afternoon.  [name], MSW, LCSWA  Clinical Social Work [phone]

## 2013-02-13 NOTE — Progress Notes (Signed)
Pt for discharge to [facility]  CSW provided discharge packet in [place], faxed pt d/c information via [facility], discussed with pt at bedside and pt daughter via telephone, provided RN phone number to call report, and arranged ambulance transportation for pt to [facility].  No further social work needs identified at this time. CSW signing off.   [name], MSW, LCSWA  Clinical Social Work [phone]

## 2013-02-13 NOTE — Progress Notes (Signed)
  Subjective: Patient reports that his leg/bone is feeling better from the radiation. The catheter is bothersome to him.  Objective: Vital signs in last 24 hours: Temp:  [97.7 F (36.5 C)-98.2 F (36.8 C)] 98.1 F (36.7 C) (03/19 0553) Pulse Rate:  [60-115] 63 (03/19 0553) Resp:  [18-20] 18 (03/19 0553) BP: (108-120)/(55-79) 109/78 mmHg (03/19 0553) SpO2:  [98 %-100 %] 100 % (03/19 0553)  Intake/Output from previous day: 03/18 0701 - 03/19 0700 In: 480 [P.O.:480] Out: 801 [Urine:800; Stool:1] Intake/Output this shift:    Physical Exam:  Constitutional: Vital signs reviewed. WD WN in NAD   Eyes: PERRL, No scleral icterus.      Lab Results: No results found for this basename: HGB, HCT,  in the last 72 hours BMET  Recent Labs  02/12/13 0530 02/13/13 0355  NA 138 137  K 3.4* 3.6  CL 94* 93*  CO2 37* 37*  GLUCOSE 160* 137*  BUN 29* 24*  CREATININE 3.91* 3.66*  CALCIUM 8.2* 8.7   No results found for this basename: LABPT, INR,  in the last 72 hours No results found for this basename: LABURIN,  in the last 72 hours Results for orders placed during the hospital encounter of 02/03/13  URINE CULTURE     Status: None   Collection Time    02/04/13  7:26 PM      Result Value Range Status   Specimen Description URINE, CATHETERIZED   Final   Special Requests Normal   Final   Culture  Setup Time 02/05/2013 03:04   Final   Colony Count NO GROWTH   Final   Culture NO GROWTH   Final   Report Status 02/05/2013 FINAL   Final  CLOSTRIDIUM DIFFICILE BY PCR     Status: None   Collection Time    02/08/13  5:20 PM      Result Value Range Status   C difficile by pcr NEGATIVE  NEGATIVE Final    Studies/Results: No results found.  Assessment/Plan:  Metastatic adenocarcinoma prostate. He has been started back on androgen deprivation, currently with a one month Lupron injection and Casodex, 50 mg which should continue for at least 2 more weeks  Acute renal insufficiency on  chronic kidney disease. His creatinine is improving with catheter drainage of his bladder. I sincerely doubt his creatinine will be back down to normal range, as I think his hydronephrosis was long-standing  Bony metastatic disease. Back on androgen depravation, giving palliative radiation. He is feeling better  I spoke with him about further management. If he perks up, and his functional status improves, I think he would be a candidate for a decompressive TURP to allow him to be catheter free. I can follow this up on an outpatient basis.    LOS: 10 days   Marcine Matar M 02/13/2013, 8:24 AM

## 2013-02-13 NOTE — Progress Notes (Signed)
Report called to [facility] at [facility].  Philomena Doheny RN

## 2013-02-13 NOTE — Discharge Summary (Signed)
Physician Discharge Summary  Banner Huckaba ZOX:[id number] DOB: [birth year] DOA: 02/03/2013  PCP: Leo Grosser, MD  Admit date: 02/03/2013 Discharge date: 02/13/2013  Recommendations for Outpatient Follow-up:  1. Continue radiation treatments per radiation oncology schedule 2. Please call urology to schedule appointment in about one month from this discharge 3. Continue Casodex for another 3 weeks on discharge  Discharge Diagnoses:  Principal Problem:   Acute renal failure Active Problems:   HYPERLIPIDEMIA   Diabetes mellitus   Hydronephrosis, bilateral   Prostate cancer metastatic to bone   Metabolic acidosis   Obstructive uropathy  Discharge Condition: Medically stable for discharge to skilled nursing facility today  Diet recommendation: As tolerated  History of present illness:  78 y.o. male with past medical history of metastatic prostrate cancer, CAD, hypertension who presented to the hospital 02/03/13 with abdominal pain for 3-4 days prior to this admission. Upon initial evaluation, he was found to have acute kidney failure with a creatinine of 6.94 and elevated Alk Phos of 1452. His abdominal KUB was consistent with enteritis. A renal ultrasound was obtained which showed bilateral hydronephrosis and new bladder mass. Renal (Dr Allena Katz) and urology (Dr Retta Diones) was consulted. Despite his history of known metastatic prostate cancer, he was lost to follow up. He refused a Foley catheter for initial 2 days of hospitalization. Per urology the bladder mass is almost certainly extension from his prostate cancer. Patient agreed to have the Foley catheter placed on 3/10 by Dr Retta Diones and since then the creatinine function has been improving. Patient received  Lupron injection on 3/10, and was started on Casodex to continue for 3 weeks by urology. Patient underwent bone scan which showed diffuse bony metastasis. He is S/P radiation oncology evaluation on 02/08/13 and began radiation  treatments on 02/11/2013.   Assessment/Plan:   Principal Problem:  Acute kidney failure with metabolic acidosis / bilateral hydronephrosis, secondary to obstructive uropathy from prostate cancer   Creatinine is steadily improving, creatinine today is 3.66 (trended down from 6.94 on admission)  Metabolic acidosis resolved  Patient will followup with urology in about one month from discharge. Active Problems:  Anemia of chronic disease  Likely related to history of prostate cancer and now likely bladder cancer Hypokalemia / Hypomagnesemia   Patient will continue taking magnesium oxide daily. Please continue checking magnesium at least once a week to make sure it is within normal limits.  Potassium repleted. Enteritis:   Possible gastroenteritis based on KUB. C.diff is negative. Patient was on Flagyl from 02/08/2013. We will discontinue Flagyl prior to discharge. HYPERLIPIDEMIA   Continue statin Diabetes mellitus   Hemoglobin A1c 7.2   Continue home medications Prostate cancer metastatic to bone   Bone scan showed a significant interval progression of the bone metastasis with multifocal abnormal areas of increased uptake in axial and appendicular skeleton.   Received a Lupron injection, now on Casodex, continue for 3 weeks per urology recommendations.   Began radiation treatment 02/11/2013 under the care of Dr. Mitzi Hansen. 8 fractions planned to right femur.   In regards to the bladder mass, per urology, no further evaluation needs to be done and focus on quality of life with regards to palliation of disease.   Code Status: Full.  Family Communication: None at bedside.  Disposition Plan: SNF today.  Medical Consultants:  Dr. Zetta Bills, Nephrology  Dr. Patsi Sears, Urology.  Dr. Jonna Coup, Radiation Oncology. Other Consultants:  Physical therapy Anti-infectives:  Flagyl 02/08/13---> 02/13/2013 Zosyn 02/03/13--->02/03/13    Discharge Exam:  Filed Vitals:    02/13/13 0553  BP: 109/78  Pulse: 63  Temp: 98.1 F (36.7 C)  Resp: 18   Filed Vitals:   02/12/13 1335 02/12/13 2100 02/12/13 2153 02/13/13 0553  BP: 109/79 108/55 120/60 109/78  Pulse: 115 60  63  Temp: 98.2 F (36.8 C) 97.7 F (36.5 C)  98.1 F (36.7 C)  TempSrc:  Oral  Oral  Resp: 20 20  18   Height:      Weight:      SpO2: 98% 100%  100%    General: Pt is alert, follows commands appropriately, not in acute distress Cardiovascular: Regular rate and rhythm, S1/S2 +, no murmurs, no rubs, no gallops Respiratory: Clear to auscultation bilaterally, no wheezing, no crackles, no rhonchi Abdominal: Soft, non tender, non distended, bowel sounds +, no guarding Extremities: no edema, no cyanosis, pulses palpable bilaterally DP and PT Neuro: Grossly nonfocal  Discharge Instructions  Discharge Orders   Future Appointments Provider Department Dept Phone   02/13/2013 12:00 PM [facility] [phone]   02/14/2013 11:10 AM [facility] [phone]   02/15/2013 11:45 AM [facility] [phone]   02/18/2013 12:20 PM [facility] [phone]   02/19/2013 11:55 AM [facility]COL[facility] [phone]   02/20/2013 11:55 AM [facility] [phone]   Future Orders Complete By Expires     Call MD for:  difficulty breathing, headache or visual disturbances  As directed     Call MD for:  persistant dizziness or light-headedness  As directed     Call MD for:  persistant nausea and vomiting  As directed     Call MD for:  severe uncontrolled pain  As directed     Diet - low sodium heart healthy  As directed     Discharge instructions  As directed     Comments:      1. Continue radiation treatments  per radiation oncology schedule 2. Please call urology to schedule appointment in one month from this discharge date 02/13/2013. 3. Continue  Casodex for another 3 weeks on discharge    Increase activity slowly  As directed         Medication List    STOP taking these medications       hydrALAZINE 25 MG tablet  Commonly known as:  APRESOLINE     isosorbide mononitrate 60 MG 24 hr tablet  Commonly known as:  IMDUR      TAKE these medications       allopurinol 100 MG tablet  Commonly known as:  ZYLOPRIM  Take 200 mg by mouth daily.     amLODipine 5 MG tablet  Commonly known as:  NORVASC  Take 5 mg by mouth daily.     bicalutamide 50 MG tablet  Commonly known as:  CASODEX  Take 1 tablet (50 mg total) by mouth daily.     colchicine 0.6 MG tablet  Take 1 tablet (0.6 mg total) by mouth daily.     finasteride 5 MG tablet  Commonly known as:  PROSCAR  Take 1 tablet (5 mg total) by mouth daily.     furosemide 20 MG tablet  Commonly known as:  LASIX  Take 20 mg by mouth daily.     magnesium oxide 400 (241.3  MG) MG tablet  Commonly known as:  MAG-OX  Take 1 tablet (400 mg total) by mouth daily.     metoprolol 50 MG tablet  Commonly known as:  LOPRESSOR  Take 50 mg by mouth 2 (two) times daily.     nitroGLYCERIN 0.4 MG SL tablet  Commonly known as:  NITROSTAT  Place 0.4 mg under the tongue every 5 (five) minutes as needed.     oxyCODONE 5 MG immediate release tablet  Commonly known as:  Oxy IR/ROXICODONE  Take 1 tablet (5 mg total) by mouth every 6 (six) hours as needed.     simvastatin 20 MG tablet  Commonly known as:  ZOCOR  Take 20 mg by mouth at bedtime.     sitaGLIPtin 25 MG tablet  Commonly known as:  JANUVIA  Take 1 tablet (25 mg total) by mouth daily with breakfast.     tamsulosin 0.4 MG Caps  Commonly known as:  FLOMAX  Take 0.4 mg by mouth daily.     Vitamin D (Ergocalciferol) 50000 UNITS Caps  Commonly known as:  DRISDOL  Take 50,000 Units by  mouth 2 (two) times a week.           Follow-up Information   Follow up with [facility] TOM, MD In 2 weeks.   Contact information:   [address] [place] [address] [phone]       Follow up with Chelsea Aus, MD. Schedule an appointment as soon as possible for a visit in 4 weeks.   Contact information:   [address] [place] [address] [phone]        The results of significant diagnostics from this hospitalization (including imaging, microbiology, ancillary and laboratory) are listed below for reference.    Significant Diagnostic Studies: Ct Abdomen Pelvis Wo Contrast  02/03/2013  *RADIOLOGY REPORT*  Clinical Data: Abdominal pain, history of prostate cancer and chronic kidney disease  CT ABDOMEN AND PELVIS WITHOUT CONTRAST  Technique:  Multidetector CT imaging of the abdomen and pelvis was performed following the standard protocol without intravenous contrast.  Comparison: 02/03/2013 radiographs  Findings: Degraded by streak artifact due to extremity positioning. There are small bilateral pleural effusions.  Associated airspace opacities; atelectasis versus infiltrate.  Coronary artery calcification.  Heart size upper normal.  Small hiatal hernia.  Organ abnormality/lesion detection is limited in the absence of intravenous contrast. Within this limitation, unremarkable liver, spleen, pancreas, adrenal glands.  No radiodense gallstones.  No biliary ductal dilatation.  Severe bilateral hydroureteronephrosis to the level of the UVJs.  Mild colonic diverticulosis.  No CT evidence for colitis or diverticulitis.  There is contrast within the right colon.  Small bowel loops are normal course and caliber.  No free intraperitoneal air or fluid.  Nonspecific retroperitoneal lymph nodes, measuring up to 12 mm short axis periaortic. Left external iliac chain lymph node measuring 10 mm short axis.  There is scattered atherosclerotic calcification of  the aorta and its branches. No aneurysmal dilatation.  Distended bladder with bilateral diverticula.  Multilobulated filling defect along the bladder inferiorly.  Multiple sclerotic foci throughout the visualized bones.  No displaced fracture identified.  IMPRESSION: No bowel obstruction. Unenhanced CT has limited sensitivity for bowel ischemia detection.  No secondary signs such as bowel wall pneumatosis or focal/segmental thickening.  Severe bilateral hydroureteronephrosis and bladder distension. Multiloculated filling defect along the bladder wall inferiorly. Correlate with urinalysis/cystoscopy.  May reflect prostate/bladder carcinoma and/or blood clots.  Evidence  of metastatic disease with scattered sclerotic foci and retroperitoneal lymphadenopathy.  Small bilateral pleural effusions with associated airspace opacities; atelectasis versus infiltrate.   Original Report Authenticated By: Jearld Lesch, M.D.    Nm Bone Scan Whole Body  02/05/2013  *RADIOLOGY REPORT*  Clinical Data: Follow up prostate cancer metastasis  NUCLEAR MEDICINE WHOLE BODY BONE SCINTIGRAPHY  Technique:  Whole body anterior and posterior images were obtained approximately 3 hours after intravenous injection of radiopharmaceutical.  Radiopharmaceutical: CURIE TC-MDP TECHNETIUM TC 59M MEDRONATE IV KIT  Comparison: 02/09/2012  Findings: Multifocal abnormal areas of increased radiotracer uptake are noted within the axial and appendicular skeleton consistent with prostate cancer metastasis. Compared with the previous exam there has been progression of prostate cancer metastasis.  The large lesion within the proximal right proximal humeral has increased in size compared with previous exam. There has also been increase in size of the proximal left femur lesion. Significant increase in size of sacral metastasis.  Increase in the number of the thoracic and rib metastasis.  There is faint physiologic tracer uptake noted within the  kidneys and bladder.  IMPRESSION:  1.  Significant interval progression of the bone metastases.   Original Report Authenticated By: Signa Kell, M.D.    US Renal  02/07/2013  *RADIOLOGY REPORT*  Clinical Data: Follow up bladder decompression  RENAL/URINARY TRACT ULTRASOUND COMPLETE  Comparison:  02/03/2013  Findings:  Right Kidney:  Measures 10.6 cm.  Echogenic renal parenchyma, suggesting medical renal disease.  11 x 5 x 9 mm interpolar cyst. Mild hydronephrosis.  Left Kidney:  Measures 11.6 cm.  Echogenic renal parenchyma, suggesting medical renal disease.  Moderate hydronephrosis.  Bladder:  7.3 x 7.6 x 8.1 cm heterogeneous mass/debris within an otherwise decompressed bladder with indwelling Foley catheter.  IMPRESSION: Mild right and moderate left hydronephrosis.  Stable bladder mass/debris.  Indwelling Foley catheter.   Original Report Authenticated By: Charline Bills, M.D.    US Renal  02/03/2013  *RADIOLOGY REPORT*  Clinical Data: Acute renal failure.  History of prostate cancer.  RENAL/URINARY TRACT ULTRASOUND COMPLETE  Comparison:  None.  Findings:  Right Kidney:  Measures 11.1 cm.  There is marked hydronephrosis. No stone or mass identified.  Left Kidney:  Measures 11.3 cm.  There is severe hydronephrosis. No stone or mass identified.  Bladder:  A large mass lesion is identified within the urinary bladder.  IMPRESSION: Severe bilateral hydronephrosis likely due to a large mass lesion in the urinary bladder.   Original Report Authenticated By: Holley Dexter, M.D.    Dg Chest Port 1 View  02/03/2013  *RADIOLOGY REPORT*  Clinical Data: Line placement.  PORTABLE CHEST - 1 VIEW  Comparison: Single view of the chest earlier this same date.  Findings: New right IJ catheter is in place.  Tip of the catheter is just within the right atrium and the line should be withdrawn 3 cm for better positioning.  No pneumothorax identified.  There is cardiomegaly and vascular congestion.  IMPRESSION:  1.  Tip of  right IJ catheter projects just in the right atrium. The line should be withdrawn approximately 3 cm for better positioning.  No pneumothorax. 2.  Cardiomegaly and vascular congestion.   Original Report Authenticated By: Holley Dexter, M.D.    Dg Abd Acute W/chest  02/03/2013  *RADIOLOGY REPORT*  Clinical Data: Chronic shortness of breath, constipation, abdominal pain  ACUTE ABDOMEN SERIES (ABDOMEN 2 VIEW & CHEST 1 VIEW)  Comparison: Chest radiographs dated 05/25/2012  Findings: Lungs are essentially  clear.  No focal consolidation. Small right pleural effusion.  Mild cardiomegaly.  Nonspecific bowel gas pattern with mucosal thickening involving multiple loops of small bowel in the mid abdomen.  The colon is not decompressed.  This appearance favors a small bowel enteritis and does not suggest small bowel obstruction.  No evidence of free air under the diaphragm on the upright view.  Degenerative changes of the visualized thoracolumbar spine.  IMPRESSION: Small right pleural effusion.  Mucosal thickening involving multiple small bowel loops, possibly reflecting small bowel enteritis.  No evidence of small bowel obstruction or free air.   Original Report Authenticated By: Charline Bills, M.D.    Dg Abd Portable 2v  02/07/2013  *RADIOLOGY REPORT*  Clinical Data: Enteritis  PORTABLE ABDOMEN - 2 VIEW  Comparison: CT 02/03/2013, KUB 02/03/2013  Findings: Mildly distended small bowel loops, similar to the prior study.  No air-fluid levels on the decubitus view.  There is gas in nondilated colon.  Air-fluid levels are not prominent on the decubitus view.  No free air.  IMPRESSION: No significant change.  Possible gastroenteritis.  Partial small bowel obstruction also possible.   Original Report Authenticated By: Janeece Riggers, M.D.     Microbiology: Recent Results (from the past 240 hour(s))  URINE CULTURE     Status: None   Collection Time    02/04/13  7:26 PM      Result Value Range Status   Specimen  Description URINE, CATHETERIZED   Final   Special Requests Normal   Final   Culture  Setup Time 02/05/2013 03:04   Final   Colony Count NO GROWTH   Final   Culture NO GROWTH   Final   Report Status 02/05/2013 FINAL   Final  CLOSTRIDIUM DIFFICILE BY PCR     Status: None   Collection Time    02/08/13  5:20 PM      Result Value Range Status   C difficile by pcr NEGATIVE  NEGATIVE Final     Labs: Basic Metabolic Panel:  Recent Labs Lab 02/08/13 0450 02/09/13 0625 02/10/13 0435 02/11/13 0353 02/12/13 0530 02/13/13 0355  NA 139 139  --  138 138 137  K 3.7 3.6  --  2.9* 3.4* 3.6  CL 98 99  --  95* 94* 93*  CO2 32 32  --  34* 37* 37*  GLUCOSE 185* 122*  --  120* 160* 137*  BUN 45* 35*  --  31* 29* 24*  CREATININE 4.77* 4.09* 4.02* 4.02* 3.91* 3.66*  CALCIUM 7.8* 7.9*  --  8.0* 8.2* 8.7  MG 1.3*  --   --   --  1.4*  --   PHOS 3.3 3.2  --   --   --   --    Liver Function Tests:  Recent Labs Lab 02/08/13 0450 02/09/13 0625  ALBUMIN 2.3* 2.2*   No results found for this basename: LIPASE, AMYLASE,  in the last 168 hours No results found for this basename: AMMONIA,  in the last 168 hours CBC:  Recent Labs Lab 02/08/13 0450 02/09/13 0625  WBC 5.4 4.4  HGB 8.6* 8.9*  HCT 24.6* 25.3*  MCV 76.6* 76.7*  PLT 201 202   Cardiac Enzymes: No results found for this basename: CKTOTAL, CKMB, CKMBINDEX, TROPONINI,  in the last 168 hours BNP: BNP (last 3 results) No results found for this basename: PROBNP,  in the last 8760 hours CBG:  Recent Labs Lab 02/12/13 0741 02/12/13 1149 02/12/13 1715 02/12/13 2102  02/13/13 0731  GLUCAP 155* 106* 140* 122* 133*    Time coordinating discharge: Over 30 minutes  Signed:  Manson Passey, MD  [facility]  02/13/2013, 11:22 AM  Pager #: [phone]

## 2013-02-14 ENCOUNTER — Ambulatory Visit
Admit: 2013-02-14 | Discharge: 2013-02-14 | Disposition: A | Discharge status: Home / Self Care | Payer: Medicare Other | Attending: Radiation Oncology | Admitting: Radiation Oncology

## 2013-02-15 ENCOUNTER — Ambulatory Visit
Admit: 2013-02-15 | Discharge: 2013-02-15 | Disposition: A | Discharge status: Home / Self Care | Payer: Medicare Other | Attending: Radiation Oncology | Admitting: Radiation Oncology

## 2013-02-15 ENCOUNTER — Non-Acute Institutional Stay (SKILLED_NURSING_FACILITY): Payer: Medicare Other | Admitting: Internal Medicine

## 2013-02-15 ENCOUNTER — Ambulatory Visit
Admission: RE | Admit: 2013-02-15 | Discharge: 2013-02-15 | Disposition: A | Discharge status: Home / Self Care | Payer: Medicare Other | Source: Ambulatory Visit | Attending: Radiation Oncology | Admitting: Radiation Oncology

## 2013-02-15 ENCOUNTER — Telehealth (HOSPITAL_COMMUNITY): Payer: Self-pay | Admitting: Emergency Medicine

## 2013-02-15 VITALS — BP 84/61 | HR 70 | Temp 98.4°F

## 2013-02-15 DIAGNOSIS — Z8639 Personal history of other endocrine, nutritional and metabolic disease: Secondary | ICD-10-CM

## 2013-02-15 DIAGNOSIS — N139 Obstructive and reflux uropathy, unspecified: Secondary | ICD-10-CM

## 2013-02-15 DIAGNOSIS — C61 Malignant neoplasm of prostate: Secondary | ICD-10-CM

## 2013-02-15 DIAGNOSIS — I252 Old myocardial infarction: Secondary | ICD-10-CM

## 2013-02-15 DIAGNOSIS — C800 Disseminated malignant neoplasm, unspecified: Secondary | ICD-10-CM

## 2013-02-15 DIAGNOSIS — Z8739 Personal history of other diseases of the musculoskeletal system and connective tissue: Secondary | ICD-10-CM

## 2013-02-15 DIAGNOSIS — I10 Essential (primary) hypertension: Secondary | ICD-10-CM

## 2013-02-15 DIAGNOSIS — N179 Acute kidney failure, unspecified: Secondary | ICD-10-CM

## 2013-02-15 DIAGNOSIS — R5381 Other malaise: Secondary | ICD-10-CM

## 2013-02-15 DIAGNOSIS — E785 Hyperlipidemia, unspecified: Secondary | ICD-10-CM

## 2013-02-15 DIAGNOSIS — D638 Anemia in other chronic diseases classified elsewhere: Secondary | ICD-10-CM | POA: Insufficient documentation

## 2013-02-15 DIAGNOSIS — C7951 Secondary malignant neoplasm of bone: Secondary | ICD-10-CM | POA: Insufficient documentation

## 2013-02-15 DIAGNOSIS — C7952 Secondary malignant neoplasm of bone marrow: Secondary | ICD-10-CM

## 2013-02-15 DIAGNOSIS — E119 Type 2 diabetes mellitus without complications: Secondary | ICD-10-CM

## 2013-02-15 DIAGNOSIS — R531 Weakness: Secondary | ICD-10-CM

## 2013-02-15 NOTE — Assessment & Plan Note (Signed)
Remains chest pain free. Continue b blocker, statin, s/l NTG

## 2013-02-15 NOTE — Assessment & Plan Note (Addendum)
Continue allopurinol and colchicine for now. No recent gout attack

## 2013-02-15 NOTE — Assessment & Plan Note (Signed)
Continue sitagliptin, monitor cbg

## 2013-02-15 NOTE — Assessment & Plan Note (Signed)
Has foley in place, on finasteride and tamsulosin, being followed by urology. Monitor urine output.

## 2013-02-15 NOTE — Progress Notes (Signed)
Weekly Management Note Current Dose:  17.5 Gy  Projected Dose: 28 Gy   Narrative:  The patient presents for routine under treatment assessment.  CBCT/MVCT images/Port film x-rays were reviewed.  The chart was checked. No pain. Alert x 2.   Physical Findings: In a wheelchair. No distress.  Impression:  The patient is tolerating radiation.  Plan:  Continue treatment as planned.

## 2013-02-15 NOTE — Assessment & Plan Note (Signed)
Will have him work with PT/OT as tolerated to help regain strength and restore function. Fall precautions. Encourage po intake.

## 2013-02-15 NOTE — Progress Notes (Signed)
Patient ID: [name], male   DOB: 20-Jun-1935, 78 y.o.   MRN: [id number]    PCP: Leo Grosser, MD  Code Status: full code  Allergies  Allergen Reactions  . Aspirin   . Feldene (Piroxicam) Swelling    Chief Complaint: new admit post hospitalization  HPI:  78 y/o male patient with hx of prostate cancer lost to follow up was admitted to hospital with abdominal pain. He was found to have ARFwith creatinine of 6.94 and elevated ALP. kub was suggestive of enteritis and he was started on antibiotics. Renal usg showed b/l hydronephrosis and new bladder mass. Renal and urology was consulted. He had foley inserted and was given lupron and casodex. Bone scan was s/o diffuse metastases. He received 2 cycles of radiation in hospital. Given his weakness he was sent to [facility] for STR. He was seen in his room today. He received radiation today. He is not in a good mood and refuses to interact. He mentions everything is fine with him, denies being in any pain and says he would like to be by himself.  No concerns from staff. Unable to obtain ros from pt as he refuses to communicate.  Review of Systems:  See hpi  Past Medical History  Diagnosis Date  . CAD (coronary artery disease)   . Hypertension   . Obesity   . Dyslipidemia   . Myocardial infarction 2007  . H/O allergic rhinitis   . Cancer     metastatic prostate cancer  . Anemia   . H/O: gout   . H/O hemorrhoids   . Pneumonia   . H/O: GI bleed   . Bone metastasis   . Diabetes mellitus   . Arthritis   . Rhinitis, allergic   . Carpal tunnel syndrome   . Renal insufficiency     ckd III  . Renal insufficiency   . Fatty liver   . Shoulder dislocation 1970    right   Past Surgical History  Procedure Laterality Date  . Transurethral resection of prostate  06/08/2011, 11/08/06  . Right hand surgery  1974   Social History:   reports that he has quit smoking. He has never used smokeless tobacco. He reports that he does not drink  alcohol or use illicit drugs.  Family History  Problem Relation Age of Onset  . Diabetes Mother     Medications: Patient's Medications  New Prescriptions   No medications on file  Previous Medications   ALLOPURINOL (ZYLOPRIM) 100 MG TABLET    Take 200 mg by mouth daily.   AMLODIPINE (NORVASC) 5 MG TABLET    Take 5 mg by mouth daily.   BICALUTAMIDE (CASODEX) 50 MG TABLET    Take 1 tablet (50 mg total) by mouth daily.   COLCHICINE 0.6 MG TABLET    Take 1 tablet (0.6 mg total) by mouth daily.   FINASTERIDE (PROSCAR) 5 MG TABLET    Take 1 tablet (5 mg total) by mouth daily.   FUROSEMIDE (LASIX) 20 MG TABLET    Take 20 mg by mouth daily.   MAGNESIUM OXIDE (MAG-OX) 400 (241.3 MG) MG TABLET    Take 1 tablet (400 mg total) by mouth daily.   METOPROLOL (LOPRESSOR) 50 MG TABLET    Take 50 mg by mouth 2 (two) times daily.     NITROGLYCERIN (NITROSTAT) 0.4 MG SL TABLET    Place 0.4 mg under the tongue every 5 (five) minutes as needed.     OXYCODONE (OXY IR/ROXICODONE)  5 MG IMMEDIATE RELEASE TABLET    Take 1 tablet (5 mg total) by mouth every 6 (six) hours as needed.   SIMVASTATIN (ZOCOR) 20 MG TABLET    Take 20 mg by mouth at bedtime.     SITAGLIPTIN (JANUVIA) 25 MG TABLET    Take 1 tablet (25 mg total) by mouth daily with breakfast.   TAMSULOSIN HCL (FLOMAX) 0.4 MG CAPS    Take 0.4 mg by mouth daily.   VITAMIN D, ERGOCALCIFEROL, (DRISDOL) 50000 UNITS CAPS    Take 50,000 Units by mouth 2 (two) times a week.  Modified Medications   No medications on file  Discontinued Medications   No medications on file     Physical Exam:  Filed Vitals:   02/15/13 1559  BP: 104/56  Pulse: 77  Resp: 16  Height: 5\' 7"  (1.702 m)  Weight: 195 lb (88.451 kg)   Pt refused physical exam. Has foley catheter in place   Labs reviewed: Basic Metabolic Panel:  Recent Labs Lab 02/09/13 0625 02/10/13 0435 02/11/13 0353 02/12/13 0530 02/13/13 0355  NA 139  --  138 138 137  K 3.6  --  2.9* 3.4* 3.6  CL  99  --  95* 94* 93*  CO2 32  --  34* 37* 37*  GLUCOSE 122*  --  120* 160* 137*  BUN 35*  --  31* 29* 24*  CREATININE 4.09* 4.02* 4.02* 3.91* 3.66*  CALCIUM 7.9*  --  8.0* 8.2* 8.7  MG  --   --   --  1.4*  --   PHOS 3.2  --   --   --   --    Liver Function Tests:  Recent Labs Lab 02/09/13 0625  ALBUMIN 2.2*   No results found for this basename: LIPASE, AMYLASE,  in the last 168 hours No results found for this basename: AMMONIA,  in the last 168 hours CBC:  Recent Labs Lab 02/09/13 0625  WBC 4.4  HGB 8.9*  HCT 25.3*  MCV 76.7*  PLT 202   CBG:  Recent Labs Lab 02/12/13 1149 02/12/13 1715 02/12/13 2102 02/13/13 0731 02/13/13 1146  GLUCAP 106* 140* 122* 133* 120*    Radiological Exams: Reviewed in EPIC  02/03/13 CT-scan of abdomen and pelvis:  No bowel obstruction. Unenhanced CT has limited sensitivity for  bowel ischemia detection. No secondary signs such as bowel wall  pneumatosis or focal/segmental thickening.  Severe bilateral hydroureteronephrosis and bladder distension.  Multiloculated filling defect along the bladder wall inferiorly.  Correlate with urinalysis/cystoscopy. May reflect prostate/bladder  carcinoma and/or blood clots.  Evidence of metastatic disease with scattered sclerotic foci and  retroperitoneal lymphadenopathy.  Small bilateral pleural effusions with associated airspace  opacities; atelectasis versus infiltrate.  02/05/13 bone scan- Significant interval progression of the bone metastases.   Assessment/Plan Acute renal failure Improving but persists. B/l hydronephrosis from obstructive uropathy from prostate cancer contributing mainly. Monitor bmp. To follow with urology  Anemia of chronic disease Malignancy and CKD could be contributing to this Monitor cbc  HYPERLIPIDEMIA We will continue to monitor current regimen and make changes as necessary. statin  Prostate cancer metastatic to bone Received lupron injection and to continue  casodex. Also undergoing radiation treatment for mets to the bone. Also has extension to bladder which is being managed conservatively now. Follows with urology and radiation/oncology. Has foley catheter in place.on oxycodone 5 mg q6h prn for pain  H/O: gout Continue allopurinol and colchicine for now. No recent gout attack  Hypertension bp remains stable currently. Continue amlodipine 5 mg daily, furosemide 20 mg daily, lopressor 50 mg bid  OLD MYOCARDIAL INFARCTION Remains chest pain free. Continue b blocker, statin, s/l NTG  Diabetes mellitus Continue sitagliptin, monitor cbg  Obstructive uropathy Has foley in place, on finasteride and tamsulosin, being followed by urology. Monitor urine output.   Weakness generalized Will have him work with PT/OT as tolerated to help regain strength and restore function. Fall precautions. Encourage po intake.    Goals of care: patient to return home after completion of therapy, med compliance and follow up to be reinforced prior to discharge from facility. Advance directive to be discussed with patient and family   Labs/tests ordered: Cbc, cmp, magnesium

## 2013-02-15 NOTE — Assessment & Plan Note (Signed)
Malignancy and CKD could be contributing to this Monitor cbc

## 2013-02-15 NOTE — Assessment & Plan Note (Signed)
bp remains stable currently. Continue amlodipine 5 mg daily, furosemide 20 mg daily, lopressor 50 mg bid

## 2013-02-15 NOTE — Progress Notes (Signed)
Mr. Hosick has received 5/8 fractions to his left femur.  He denies any pain.  Travel by wheelchair.  He is presently a resident of [facility].

## 2013-02-15 NOTE — Assessment & Plan Note (Addendum)
Received lupron injection and to continue casodex. Also undergoing radiation treatment for mets to the bone. Also has extension to bladder which is being managed conservatively now. Follows with urology and radiation/oncology. Has foley catheter in place.on oxycodone 5 mg q6h prn for pain

## 2013-02-15 NOTE — Assessment & Plan Note (Signed)
We will continue to monitor current regimen and make changes as necessary. statin

## 2013-02-15 NOTE — Assessment & Plan Note (Signed)
Improving but persists. B/l hydronephrosis from obstructive uropathy from prostate cancer contributing mainly. Monitor bmp. To follow with urology

## 2013-02-18 ENCOUNTER — Ambulatory Visit
Admit: 2013-02-18 | Discharge: 2013-02-18 | Disposition: A | Discharge status: Home / Self Care | Payer: Medicare Other | Attending: Radiation Oncology | Admitting: Radiation Oncology

## 2013-02-19 ENCOUNTER — Ambulatory Visit
Admit: 2013-02-19 | Discharge: 2013-02-19 | Disposition: A | Discharge status: Home / Self Care | Payer: Medicare Other | Attending: Radiation Oncology | Admitting: Radiation Oncology

## 2013-02-20 ENCOUNTER — Encounter: Payer: Self-pay | Admitting: Radiation Oncology

## 2013-02-20 ENCOUNTER — Ambulatory Visit
Admit: 2013-02-20 | Discharge: 2013-02-20 | Disposition: A | Discharge status: Home / Self Care | Payer: Medicare Other | Attending: Radiation Oncology | Admitting: Radiation Oncology

## 2013-02-20 ENCOUNTER — Ambulatory Visit
Admission: RE | Admit: 2013-02-20 | Discharge: 2013-02-20 | Disposition: A | Discharge status: Home / Self Care | Payer: Medicare Other | Source: Ambulatory Visit | Attending: Radiation Oncology | Admitting: Radiation Oncology

## 2013-02-20 VITALS — BP 130/78 | HR 68 | Temp 97.7°F

## 2013-02-20 DIAGNOSIS — C7951 Secondary malignant neoplasm of bone: Secondary | ICD-10-CM

## 2013-02-20 NOTE — Progress Notes (Signed)
   Department of Radiation Oncology  Phone:  [phone] Fax:        [phone]  Weekly Treatment Note    Name: [name] Date: 02/20/2013 MRN: [id number] DOB: 1935-04-22   Current dose: 28 Gy  Current fraction: 8   MEDICATIONS: Current Outpatient Prescriptions  Medication Sig Dispense Refill  . allopurinol (ZYLOPRIM) 100 MG tablet Take 200 mg by mouth daily.      Marland Kitchen amLODipine (NORVASC) 5 MG tablet Take 5 mg by mouth daily.      . bicalutamide (CASODEX) 50 MG tablet Take 1 tablet (50 mg total) by mouth daily.  21 tablet  0  . colchicine 0.6 MG tablet Take 1 tablet (0.6 mg total) by mouth daily.  30 tablet  0  . finasteride (PROSCAR) 5 MG tablet Take 1 tablet (5 mg total) by mouth daily.  30 tablet  0  . furosemide (LASIX) 20 MG tablet Take 20 mg by mouth daily.      . magnesium oxide (MAG-OX) 400 (241.3 MG) MG tablet Take 1 tablet (400 mg total) by mouth daily.  30 tablet  0  . metoprolol (LOPRESSOR) 50 MG tablet Take 50 mg by mouth 2 (two) times daily.        . nitroGLYCERIN (NITROSTAT) 0.4 MG SL tablet Place 0.4 mg under the tongue every 5 (five) minutes as needed.        Marland Kitchen oxyCODONE (OXY IR/ROXICODONE) 5 MG immediate release tablet Take 1 tablet (5 mg total) by mouth every 6 (six) hours as needed.  45 tablet  0  . simvastatin (ZOCOR) 20 MG tablet Take 20 mg by mouth at bedtime.        . sitaGLIPtin (JANUVIA) 25 MG tablet Take 1 tablet (25 mg total) by mouth daily with breakfast.  30 tablet  0  . Tamsulosin HCl (FLOMAX) 0.4 MG CAPS Take 0.4 mg by mouth daily.      . Vitamin D, Ergocalciferol, (DRISDOL) 50000 UNITS CAPS Take 50,000 Units by mouth 2 (two) times a week.       No current facility-administered medications for this encounter.     ALLERGIES: Aspirin and Feldene   LABORATORY DATA:  Lab Results  Component Value Date   WBC 4.4 02/09/2013   HGB 8.9* 02/09/2013   HCT 25.3* 02/09/2013   MCV 76.7* 02/09/2013   PLT 202 02/09/2013   Lab Results  Component  Value Date   NA 137 02/13/2013   K 3.6 02/13/2013   CL 93* 02/13/2013   CO2 37* 02/13/2013   Lab Results  Component Value Date   ALT 12 02/03/2013   AST 72* 02/03/2013   ALKPHOS 1452* 02/03/2013   BILITOT 0.4 02/03/2013     NARRATIVE: Helayne Seminole was seen today for weekly treatment management. The chart was checked and the patient's films were reviewed. The patient completed his final fraction today. He did well with treatment. No difficulties in terms of acute toxicity.  PHYSICAL EXAMINATION: temperature is 97.7 F (36.5 C). His blood pressure is 130/78 and his pulse is 68.      no significant skin irritation in the treatment area  ASSESSMENT: The patient did satisfactorily with treatment.  PLAN: Followup in one month.

## 2013-02-20 NOTE — Progress Notes (Signed)
Patient completes 8 of 8 treatments to left and right humerus.Much more pleasant and cooperative today than last weekly assessment.One month follow up to be scheduled.

## 2013-02-22 ENCOUNTER — Non-Acute Institutional Stay (SKILLED_NURSING_FACILITY): Payer: Medicare Other | Admitting: Internal Medicine

## 2013-02-22 DIAGNOSIS — R21 Rash and other nonspecific skin eruption: Secondary | ICD-10-CM

## 2013-02-22 DIAGNOSIS — J189 Pneumonia, unspecified organism: Secondary | ICD-10-CM

## 2013-02-22 NOTE — Progress Notes (Signed)
Patient ID: [name], male   DOB: 1935/02/18, 78 y.o.   MRN: [id number]  Cc- rash  HPI- 78 y/o male patient seen today for rash on his back and chest and abdomen for 2 days. He mentions noticing rash after his chemotherapy. It is associated with itching and he has been scratching it. He denies any rash elsewhere. He denies any mucosal involvement. Denies any itching elsewhere. No new products or creams tried. He has recently been started on levaquin 02/19/13 and rash started 2 days later. No other complaints  Review of Systems  Constitutional: Negative for fever and chills.  HENT: Negative for congestion.   Eyes: Negative for blurred vision.  Respiratory: Negative for cough and shortness of breath.   Cardiovascular: Negative for chest pain and palpitations.  Gastrointestinal: Negative for heartburn and nausea.  Skin: Positive for itching and rash.  Neurological: Negative for dizziness, sensory change and headaches.   BP 104/56  Pulse 77  Temp(Src) 97.3 F (36.3 C)  Resp 16  Wt 195 lb (88.451 kg)  BMI 30.53 kg/m2  SpO2 96%  Physical Exam  Constitutional: He is oriented to person, place, and time.  HENT:  Head: Normocephalic and atraumatic.  Mouth/Throat: Oropharynx is clear and moist.  Eyes: Conjunctivae are normal. Pupils are equal, round, and reactive to light.  Neck: Normal range of motion. Neck supple.  Cardiovascular: Normal rate.   Pulmonary/Chest: Effort normal and breath sounds normal.  Abdominal: Soft.  Neurological: He is alert and oriented to person, place, and time.  Skin: Skin is warm and dry. Rash noted. He is not diaphoretic. There is erythema.  Has scratch marks and mcular rash in right arm, back, chest and abdomen with erythema. No pustules noted.    Assessment/plan-  Pneumonia- to complete course of levaquin with florastor. Breathing stable at present  Rash- new, could be side effect from chemotherapy vs levaquin. No mucosal involvement. Airway  protected. Will have him on hydrocortisone cream 0.5% bid  and benadryl 25 mg q8h prn for now for 5 days and reassess. He will complete antibiotic on 02/25/13

## 2013-02-28 ENCOUNTER — Non-Acute Institutional Stay (SKILLED_NURSING_FACILITY): Payer: Medicare Other | Admitting: Internal Medicine

## 2013-02-28 DIAGNOSIS — M109 Gout, unspecified: Secondary | ICD-10-CM

## 2013-02-28 DIAGNOSIS — E119 Type 2 diabetes mellitus without complications: Secondary | ICD-10-CM

## 2013-02-28 DIAGNOSIS — C61 Malignant neoplasm of prostate: Secondary | ICD-10-CM

## 2013-02-28 DIAGNOSIS — C801 Malignant (primary) neoplasm, unspecified: Secondary | ICD-10-CM

## 2013-02-28 DIAGNOSIS — I10 Essential (primary) hypertension: Secondary | ICD-10-CM

## 2013-02-28 NOTE — Progress Notes (Signed)
Patient ID: [name], male   DOB: 1935/07/18, 78 y.o.   MRN: [id number]    PCP: Leo Grosser, MD  Allergies  Allergen Reactions  . Aspirin   . Feldene (Piroxicam) Swelling    Chief Complaint: discharge visit  HPI:  78 y/o male patient with h/o htn, dm, carcinoma of prostate with metastases to the bone, gout was here for STR. He has worked with PT/OT and has progressed with his mobility. He is moderately independent with bed mobility, transfers and ambulation is with roller walker. He refuses to use roller walker at home though. He denies any complaints today. He will be discharged home next week. His rash has improved  Review of Systems:  Negative for fever, chills, nausea, vomiting Denies dizziness, SOB, chest pain Pain currently under control  Past Medical History  Diagnosis Date  . CAD (coronary artery disease)   . Hypertension   . Obesity   . Dyslipidemia   . Myocardial infarction 2007  . H/O allergic rhinitis   . Cancer     metastatic prostate cancer  . Anemia   . H/O: gout   . H/O hemorrhoids   . Pneumonia   . H/O: GI bleed   . Bone metastasis   . Diabetes mellitus   . Arthritis   . Rhinitis, allergic   . Carpal tunnel syndrome   . Renal insufficiency     ckd III  . Renal insufficiency   . Fatty liver   . Shoulder dislocation 1970    right   Past Surgical History  Procedure Laterality Date  . Transurethral resection of prostate  06/08/2011, 11/08/06  . Right hand surgery  1974   Social History:   reports that he has quit smoking. He has never used smokeless tobacco. He reports that he does not drink alcohol or use illicit drugs.  Family History  Problem Relation Age of Onset  . Diabetes Mother     Medications: Patient's Medications  New Prescriptions   No medications on file  Previous Medications   ALLOPURINOL (ZYLOPRIM) 100 MG TABLET    Take 200 mg by mouth daily.   AMLODIPINE (NORVASC) 5 MG TABLET    Take 5 mg by mouth daily.   BICALUTAMIDE (CASODEX) 50 MG TABLET    Take 1 tablet (50 mg total) by mouth daily.   COLCHICINE 0.6 MG TABLET    Take 1 tablet (0.6 mg total) by mouth daily.   FINASTERIDE (PROSCAR) 5 MG TABLET    Take 1 tablet (5 mg total) by mouth daily.   FUROSEMIDE (LASIX) 20 MG TABLET    Take 20 mg by mouth 2 (two) times daily.    MAGNESIUM OXIDE (MAG-OX) 400 (241.3 MG) MG TABLET    Take 1 tablet (400 mg total) by mouth daily.   METOPROLOL (LOPRESSOR) 50 MG TABLET    Take 50 mg by mouth 2 (two) times daily.     NITROGLYCERIN (NITROSTAT) 0.4 MG SL TABLET    Place 0.4 mg under the tongue every 5 (five) minutes as needed.     OXYCODONE (OXY IR/ROXICODONE) 5 MG IMMEDIATE RELEASE TABLET    Take 1 tablet (5 mg total) by mouth every 6 (six) hours as needed.   SIMVASTATIN (ZOCOR) 20 MG TABLET    Take 20 mg by mouth at bedtime.     SITAGLIPTIN (JANUVIA) 25 MG TABLET    Take 1 tablet (25 mg total) by mouth daily with breakfast.   TAMSULOSIN HCL (FLOMAX) 0.4 MG CAPS  Take 0.4 mg by mouth daily.   VITAMIN D, ERGOCALCIFEROL, (DRISDOL) 50000 UNITS CAPS    Take 50,000 Units by mouth 2 (two) times a week.  Modified Medications   No medications on file  Discontinued Medications   No medications on file     Physical Exam:  Filed Vitals:   02/28/13 1508  BP: 112/58  Pulse: 72  Temp: 97.5 F (36.4 C)  Resp: 18  Height: 5\' 7"  (1.702 m)  Weight: 195 lb (88.451 kg)   Physical Exam  Constitutional: He is oriented to person, place, and time. He appears well-developed and well-nourished. No distress.  HENT:  Head: Normocephalic and atraumatic.  Eyes: Pupils are equal, round, and reactive to light.  Neck: Normal range of motion. Neck supple.  Cardiovascular: Normal rate and regular rhythm.   Pulmonary/Chest: Effort normal and breath sounds normal.  Abdominal: Soft. Bowel sounds are normal.  Musculoskeletal: Normal range of motion.  Neurological: He is alert and oriented to person, place, and time.  Skin: Skin  is warm and dry. He is not diaphoretic.  Psychiatric: He has a normal mood and affect.     Labs reviewed: Basic Metabolic Panel:  Recent Labs  16/10/96 0450 02/09/13 0625  02/11/13 0353 02/12/13 0530 02/13/13 0355  NA 139 139  --  138 138 137  K 3.7 3.6  --  2.9* 3.4* 3.6  CL 98 99  --  95* 94* 93*  CO2 32 32  --  34* 37* 37*  GLUCOSE 185* 122*  --  120* 160* 137*  BUN 45* 35*  --  31* 29* 24*  CREATININE 4.77* 4.09*  < > 4.02* 3.91* 3.66*  CALCIUM 7.8* 7.9*  --  8.0* 8.2* 8.7  MG 1.3*  --   --   --  1.4*  --   PHOS 3.3 3.2  --   --   --   --   < > = values in this interval not displayed. Liver Function Tests:  Recent Labs  04/03/12 1413 02/03/13 1603 02/08/13 0450 02/09/13 0625  AST 16 72*  --   --   ALT 11 12  --   --   ALKPHOS 163* 1452*  --   --   BILITOT 0.5 0.4  --   --   PROT 7.0 6.8  --   --   ALBUMIN 3.9 3.4* 2.3* 2.2*   No results found for this basename: LIPASE, AMYLASE,  in the last 8760 hours No results found for this basename: AMMONIA,  in the last 8760 hours CBC:  Recent Labs  04/03/12 1413  05/24/12 0545  02/03/13 1603 02/08/13 0450 02/09/13 0625  WBC 7.1  --  4.6  < > 7.2 5.4 4.4  NEUTROABS 4.4  --  3.1  --  6.3  --   --   HGB 10.8*  < > 10.3*  < > 10.6* 8.6* 8.9*  HCT 34.6*  < > 32.8*  < > 30.5* 24.6* 25.3*  MCV 84.2  --  83.7  < > 76.8* 76.6* 76.7*  PLT 278  --  277  < > 277 201 202  < > = values in this interval not displayed. Cardiac Enzymes:  Recent Labs  05/25/12 1849 05/26/12 0103 05/26/12 1011  CKTOTAL 83 87 83  CKMB 3.6 3.5 4.0  TROPONINI <0.30 <0.30 <0.30    Assessment/Plan Patient is stable to be discharged home with home health services, PT and social work. He refuses to use roller  walker at home. Prescriptions for medications for a month provided after reviewing his medication list.  htn- bp stable in facility. Will continue amlodipine and metoprolol tartrate and lasix  Hyperlipidemia- continue current dose  statin  DM- continue sitagliptin and to follow with pcp, continue statin  Prostate cancer with mets to bone- to continue follow up with cancer centre and urology. Continue flomax and finasteride with bicalutamide for now.  Gout- no recent flare up. Continue allopurinol and colchicine  To follow with pcp as outpatient

## 2013-03-01 NOTE — Progress Notes (Signed)
  Radiation Oncology         [phone] ________________________________  Name: [name] MRN: [id number]  Date: 02/08/2013  DOB: 1935/11/09  SIMULATION AND TREATMENT PLANNING NOTE  DIAGNOSIS:  Metastatic prostate cancer  NARRATIVE:  The patient was brought to the CT Simulation planning suite.  Identity was confirmed.  All relevant records and images related to the planned course of therapy were reviewed.   Written consent to proceed with treatment was confirmed which was freely given after reviewing the details related to the planned course of therapy had been reviewed with the patient.  Then, the patient was set-up in a stable reproducible  supine position for radiation therapy.  CT images were obtained.  Surface markings were placed.  2 target sites will be treated. A customized VAC lock bag was constructed to help with immobilization for treatment to the left femur. A customized accuform device was constructed to help with immobilization to treat the right humerus. These 2 complex treatment devices will be used on a daily basis.  The CT images were loaded into the planning software.  Then the target and avoidance structures were contoured.  Treatment planning then occurred.  The radiation prescription was entered and confirmed.  A total of 4 complex treatment devices were fabricated which relate to the designed radiation treatment fields: 2 fields for each site.. Each of these customized fields/ complex treatment devices will be used on a daily basis during the radiation course. I have requested : Isodose Plan.   PLAN:  The patient will receive 28 Gy in 8 fractions at 3.5 gray per fraction to each target site..  ________________________________   Radene Gunning, MD, PhD

## 2013-03-01 NOTE — Progress Notes (Signed)
  Radiation Oncology         [phone] ________________________________  Name: [name] MRN: [id number]  Date: 02/20/2013  DOB: 1935/06/26  End of Treatment Note  Diagnosis:   Metastatic prostate cancer     Indication for treatment:  Palliative       Radiation treatment dates:   02/11/2013 3 02/20/2013  Site/dose:    1. the right humerus was treated to 28 gray at 3.5 gray per fraction 2. The proximal left femur was treated to 28 gray at 3.5 gray per fraction.   Narrative: The patient tolerated radiation treatment relatively well.   The patient did not exhibit any substantial difficulties with acute toxicity.  Plan: The patient has completed radiation treatment. The patient will return to radiation oncology clinic for routine followup in one month. I advised the patient to call or return sooner if they have any questions or concerns related to their recovery or treatment. ________________________________  Radene Gunning, M.D., Ph.D.

## 2013-03-06 ENCOUNTER — Encounter: Payer: Self-pay | Admitting: Physician Assistant

## 2013-03-06 ENCOUNTER — Ambulatory Visit (INDEPENDENT_AMBULATORY_CARE_PROVIDER_SITE_OTHER): Payer: Medicare Other | Admitting: Physician Assistant

## 2013-03-06 VITALS — BP 116/62 | HR 76 | Temp 98.7°F | Resp 16

## 2013-03-06 DIAGNOSIS — J069 Acute upper respiratory infection, unspecified: Secondary | ICD-10-CM

## 2013-03-06 NOTE — Progress Notes (Signed)
Patient ID: [name] MRN: [id number], DOB: March 11, 1935, 78 y.o. Date of Encounter: 03/06/2013, 1:46 PM    Chief Complaint:  Chief Complaint  Patient presents with  . multiple problems    weak/unsteady  c/o pneumonia  says he needs to back to hosp  no heat at house undergoing chemo for cancer     HPI: 78 y.o. year old male says he had pneumonia "when he was at the nursing home and when he was in the hospital." Says he is out of oil at the house and has no heat and he lives alone. Says he feels very cold and he has this cold and congestion. Says he "cant be in that cold house sick like this-I need to be back at the hospital. I am so weak i need to lay down but i need somewhere warm. "  I told him that we can arrange for people to come to his house to help him if he wants me to help him with his medical problems. However, he is still insistant on going to the hospital instead. There is no need in me prescribing medications if he is going to the hospital. He again states he is going to the hospital so I did no further evaluation.  During the visit I reviewed his chart. Was just at [facility]. 3/28-was on Levaquin for pneumonia. I asked him if he completed all of the antibiotic while he was at the nursing home or if he had to complete it at home. He responds that he doesn't know.   During his visit he was coughing repeatedly. He coughed up a large amount of phlegm into the trash can.  Home Meds: Current Outpatient Prescriptions on File Prior to Visit  Medication Sig Dispense Refill  . allopurinol (ZYLOPRIM) 100 MG tablet Take 200 mg by mouth daily.      Marland Kitchen amLODipine (NORVASC) 5 MG tablet Take 5 mg by mouth daily.      . bicalutamide (CASODEX) 50 MG tablet Take 1 tablet (50 mg total) by mouth daily.  21 tablet  0  . colchicine 0.6 MG tablet Take 1 tablet (0.6 mg total) by mouth daily.  30 tablet  0  . finasteride (PROSCAR) 5 MG tablet Take 1 tablet (5 mg total) by mouth daily.  30 tablet  0  .  furosemide (LASIX) 20 MG tablet Take 20 mg by mouth 2 (two) times daily.       . magnesium oxide (MAG-OX) 400 (241.3 MG) MG tablet Take 1 tablet (400 mg total) by mouth daily.  30 tablet  0  . metoprolol (LOPRESSOR) 50 MG tablet Take 50 mg by mouth 2 (two) times daily.        . nitroGLYCERIN (NITROSTAT) 0.4 MG SL tablet Place 0.4 mg under the tongue every 5 (five) minutes as needed.        Marland Kitchen oxyCODONE (OXY IR/ROXICODONE) 5 MG immediate release tablet Take 1 tablet (5 mg total) by mouth every 6 (six) hours as needed.  45 tablet  0  . simvastatin (ZOCOR) 20 MG tablet Take 20 mg by mouth at bedtime.        . sitaGLIPtin (JANUVIA) 25 MG tablet Take 1 tablet (25 mg total) by mouth daily with breakfast.  30 tablet  0  . Tamsulosin HCl (FLOMAX) 0.4 MG CAPS Take 0.4 mg by mouth daily.      . Vitamin D, Ergocalciferol, (DRISDOL) 50000 UNITS CAPS Take 50,000 Units by mouth 2 (two) times  a week.       No current facility-administered medications on file prior to visit.    Allergies:  Allergies  Allergen Reactions  . Aspirin   . Feldene (Piroxicam) Swelling      Review of Systems: Unable to obtain any further than those mentioned in HPI.   Physical Exam not performed.   ASSESSMENT AND PLAN:  78 y.o. year old male with  1. Acute upper respiratory infections of unspecified site No Charge for this visit. He is going to the hospital for evaluation and management.  [address] [place], [place], [facility] 03/06/2013 1:46 PM

## 2013-03-07 ENCOUNTER — Emergency Department (HOSPITAL_COMMUNITY): Payer: Medicare Other

## 2013-03-07 ENCOUNTER — Inpatient Hospital Stay (HOSPITAL_COMMUNITY)
Admission: EM | Admit: 2013-03-07 | Discharge: 2013-03-08 | DRG: 292 | Disposition: A | Discharge status: Home / Self Care | Payer: Medicare Other | Attending: Internal Medicine | Admitting: Internal Medicine

## 2013-03-07 ENCOUNTER — Encounter (HOSPITAL_COMMUNITY): Payer: Self-pay | Admitting: General Practice

## 2013-03-07 DIAGNOSIS — N139 Obstructive and reflux uropathy, unspecified: Secondary | ICD-10-CM

## 2013-03-07 DIAGNOSIS — I1310 Hypertensive heart and chronic kidney disease without heart failure, with stage 1 through stage 4 chronic kidney disease, or unspecified chronic kidney disease: Secondary | ICD-10-CM | POA: Diagnosis present

## 2013-03-07 DIAGNOSIS — R531 Weakness: Secondary | ICD-10-CM

## 2013-03-07 DIAGNOSIS — J309 Allergic rhinitis, unspecified: Secondary | ICD-10-CM | POA: Diagnosis present

## 2013-03-07 DIAGNOSIS — Z6827 Body mass index (BMI) 27.0-27.9, adult: Secondary | ICD-10-CM

## 2013-03-07 DIAGNOSIS — I509 Heart failure, unspecified: Secondary | ICD-10-CM | POA: Diagnosis present

## 2013-03-07 DIAGNOSIS — I2510 Atherosclerotic heart disease of native coronary artery without angina pectoris: Secondary | ICD-10-CM | POA: Diagnosis present

## 2013-03-07 DIAGNOSIS — R079 Chest pain, unspecified: Secondary | ICD-10-CM | POA: Diagnosis present

## 2013-03-07 DIAGNOSIS — E875 Hyperkalemia: Secondary | ICD-10-CM

## 2013-03-07 DIAGNOSIS — N179 Acute kidney failure, unspecified: Secondary | ICD-10-CM

## 2013-03-07 DIAGNOSIS — Z79899 Other long term (current) drug therapy: Secondary | ICD-10-CM

## 2013-03-07 DIAGNOSIS — C7952 Secondary malignant neoplasm of bone marrow: Secondary | ICD-10-CM | POA: Diagnosis present

## 2013-03-07 DIAGNOSIS — N1330 Unspecified hydronephrosis: Secondary | ICD-10-CM

## 2013-03-07 DIAGNOSIS — E119 Type 2 diabetes mellitus without complications: Secondary | ICD-10-CM | POA: Diagnosis present

## 2013-03-07 DIAGNOSIS — N289 Disorder of kidney and ureter, unspecified: Secondary | ICD-10-CM

## 2013-03-07 DIAGNOSIS — Z886 Allergy status to analgesic agent status: Secondary | ICD-10-CM

## 2013-03-07 DIAGNOSIS — N390 Urinary tract infection, site not specified: Secondary | ICD-10-CM

## 2013-03-07 DIAGNOSIS — N259 Disorder resulting from impaired renal tubular function, unspecified: Secondary | ICD-10-CM

## 2013-03-07 DIAGNOSIS — E669 Obesity, unspecified: Secondary | ICD-10-CM | POA: Diagnosis present

## 2013-03-07 DIAGNOSIS — I5033 Acute on chronic diastolic (congestive) heart failure: Principal | ICD-10-CM

## 2013-03-07 DIAGNOSIS — Z8739 Personal history of other diseases of the musculoskeletal system and connective tissue: Secondary | ICD-10-CM | POA: Diagnosis present

## 2013-03-07 DIAGNOSIS — N184 Chronic kidney disease, stage 4 (severe): Secondary | ICD-10-CM | POA: Diagnosis present

## 2013-03-07 DIAGNOSIS — C7951 Secondary malignant neoplasm of bone: Secondary | ICD-10-CM | POA: Diagnosis present

## 2013-03-07 DIAGNOSIS — D638 Anemia in other chronic diseases classified elsewhere: Secondary | ICD-10-CM | POA: Diagnosis present

## 2013-03-07 DIAGNOSIS — E872 Acidosis: Secondary | ICD-10-CM

## 2013-03-07 DIAGNOSIS — I10 Essential (primary) hypertension: Secondary | ICD-10-CM

## 2013-03-07 DIAGNOSIS — M129 Arthropathy, unspecified: Secondary | ICD-10-CM | POA: Diagnosis present

## 2013-03-07 DIAGNOSIS — C61 Malignant neoplasm of prostate: Secondary | ICD-10-CM | POA: Diagnosis present

## 2013-03-07 DIAGNOSIS — E871 Hypo-osmolality and hyponatremia: Secondary | ICD-10-CM

## 2013-03-07 DIAGNOSIS — E785 Hyperlipidemia, unspecified: Secondary | ICD-10-CM | POA: Diagnosis present

## 2013-03-07 DIAGNOSIS — Z87891 Personal history of nicotine dependence: Secondary | ICD-10-CM

## 2013-03-07 DIAGNOSIS — E162 Hypoglycemia, unspecified: Secondary | ICD-10-CM

## 2013-03-07 DIAGNOSIS — K7689 Other specified diseases of liver: Secondary | ICD-10-CM | POA: Diagnosis present

## 2013-03-07 DIAGNOSIS — Z8701 Personal history of pneumonia (recurrent): Secondary | ICD-10-CM

## 2013-03-07 DIAGNOSIS — I252 Old myocardial infarction: Secondary | ICD-10-CM

## 2013-03-07 DIAGNOSIS — N189 Chronic kidney disease, unspecified: Secondary | ICD-10-CM | POA: Diagnosis present

## 2013-03-07 HISTORY — DX: Malignant neoplasm of prostate: C61

## 2013-03-07 HISTORY — DX: Unspecified asthma, uncomplicated: J45.909

## 2013-03-07 HISTORY — DX: Chronic kidney disease, stage 3 (moderate): N18.3

## 2013-03-07 HISTORY — DX: Chronic kidney disease, stage 3 unspecified: N18.30

## 2013-03-07 HISTORY — DX: Personal history of other diseases of the musculoskeletal system and connective tissue: Z87.39

## 2013-03-07 LAB — URINALYSIS, ROUTINE W REFLEX MICROSCOPIC
Bilirubin Urine: NEGATIVE
Nitrite: NEGATIVE
Specific Gravity, Urine: 1.014 (ref 1.005–1.030)
Urobilinogen, UA: 0.2 mg/dL (ref 0.0–1.0)
pH: 6 (ref 5.0–8.0)

## 2013-03-07 LAB — CBC
Platelets: 216 10*3/uL (ref 150–400)
RDW: 17.6 % — ABNORMAL HIGH (ref 11.5–15.5)
WBC: 5 10*3/uL (ref 4.0–10.5)

## 2013-03-07 LAB — CREATININE, SERUM
GFR calc Af Amer: 23 mL/min — ABNORMAL LOW (ref >90)
GFR calc non Af Amer: 20 mL/min — ABNORMAL LOW (ref >90)

## 2013-03-07 LAB — BASIC METABOLIC PANEL
BUN: 38 mg/dL — ABNORMAL HIGH (ref 6–23)
Chloride: 106 mEq/L (ref 96–112)
GFR calc Af Amer: 24 mL/min — ABNORMAL LOW (ref >90)
GFR calc non Af Amer: 21 mL/min — ABNORMAL LOW (ref >90)
Glucose, Bld: 75 mg/dL (ref 70–99)
Potassium: 4.9 mEq/L (ref 3.5–5.1)
Sodium: 136 mEq/L (ref 135–145)

## 2013-03-07 LAB — GLUCOSE, CAPILLARY

## 2013-03-07 LAB — CBC WITH DIFFERENTIAL/PLATELET
Eosinophils Absolute: 0.1 10*3/uL (ref 0.0–0.7)
Hemoglobin: 8.9 g/dL — ABNORMAL LOW (ref 13.0–17.0)
Lymphocytes Relative: 12 % (ref 12–46)
Lymphs Abs: 0.7 10*3/uL (ref 0.7–4.0)
Monocytes Relative: 6 % (ref 3–12)
Neutro Abs: 4.3 10*3/uL (ref 1.7–7.7)
Neutrophils Relative %: 80 % — ABNORMAL HIGH (ref 43–77)
Platelets: 221 10*3/uL (ref 150–400)
RBC: 3.45 MIL/uL — ABNORMAL LOW (ref 4.22–5.81)
WBC: 5.3 10*3/uL (ref 4.0–10.5)

## 2013-03-07 LAB — TROPONIN I
Troponin I: <0.3 ng/mL (ref <0.30)
Troponin I: <0.3 ng/mL (ref <0.30)
Troponin I: <0.3 ng/mL (ref <0.30)

## 2013-03-07 LAB — POCT I-STAT TROPONIN I

## 2013-03-07 LAB — URINE MICROSCOPIC-ADD ON

## 2013-03-07 LAB — IRON AND TIBC
Iron: 74 ug/dL (ref 42–135)
UIBC: 129 ug/dL (ref 125–400)

## 2013-03-07 LAB — RETICULOCYTES
RBC.: 3.26 MIL/uL — ABNORMAL LOW (ref 4.22–5.81)
Retic Count, Absolute: 35.9 10*3/uL (ref 19.0–186.0)

## 2013-03-07 LAB — VITAMIN B12: Vitamin B-12: 443 pg/mL (ref 211–911)

## 2013-03-07 LAB — LACTIC ACID, PLASMA: Lactic Acid, Venous: 1.1 mmol/L (ref 0.5–2.2)

## 2013-03-07 MED ORDER — VITAMIN D (ERGOCALCIFEROL) 1.25 MG (50000 UNIT) PO CAPS
50000.0000 [IU] | ORAL_CAPSULE | ORAL | Status: DC
  Administered 2013-03-07: 50000 [IU] via ORAL
  Filled 2013-03-07: qty 1

## 2013-03-07 MED ORDER — ACETAMINOPHEN 325 MG PO TABS: 650.0000 mg | ORAL_TABLET | ORAL | Status: DC | PRN

## 2013-03-07 MED ORDER — SIMVASTATIN 20 MG PO TABS
20.0000 mg | ORAL_TABLET | Freq: Every day | ORAL | Status: DC
  Administered 2013-03-07: 20 mg via ORAL
  Filled 2013-03-07 (×2): qty 1

## 2013-03-07 MED ORDER — HEPARIN SODIUM (PORCINE) 5000 UNIT/ML IJ SOLN
5000.0000 [IU] | Freq: Three times a day (TID) | INTRAMUSCULAR | Status: DC
  Administered 2013-03-07 – 2013-03-08 (×3): 5000 [IU] via SUBCUTANEOUS
  Filled 2013-03-07 (×5): qty 1

## 2013-03-07 MED ORDER — METOPROLOL TARTRATE 50 MG PO TABS
50.0000 mg | ORAL_TABLET | Freq: Two times a day (BID) | ORAL | Status: DC
  Administered 2013-03-07 – 2013-03-08 (×2): 50 mg via ORAL
  Filled 2013-03-07 (×3): qty 1

## 2013-03-07 MED ORDER — INSULIN ASPART 100 UNIT/ML ~~LOC~~ SOLN
0.0000 [IU] | Freq: Three times a day (TID) | SUBCUTANEOUS | Status: DC
  Administered 2013-03-08: 1 [IU] via SUBCUTANEOUS

## 2013-03-07 MED ORDER — COLCHICINE 0.6 MG PO TABS
0.6000 mg | ORAL_TABLET | Freq: Every day | ORAL | Status: DC
  Administered 2013-03-07 – 2013-03-08 (×2): 0.6 mg via ORAL
  Filled 2013-03-07 (×2): qty 1

## 2013-03-07 MED ORDER — ONDANSETRON HCL 4 MG/2ML IJ SOLN: 4.0000 mg | Freq: Four times a day (QID) | INTRAMUSCULAR | Status: DC | PRN

## 2013-03-07 MED ORDER — SODIUM CHLORIDE 0.9 % IJ SOLN: 3.0000 mL | INTRAMUSCULAR | Status: DC | PRN

## 2013-03-07 MED ORDER — NITROGLYCERIN 0.4 MG SL SUBL: 0.4000 mg | SUBLINGUAL_TABLET | SUBLINGUAL | Status: DC | PRN

## 2013-03-07 MED ORDER — TAMSULOSIN HCL 0.4 MG PO CAPS
0.4000 mg | ORAL_CAPSULE | Freq: Every day | ORAL | Status: DC
Start: 2013-03-07 — End: 2013-03-08
  Administered 2013-03-07 – 2013-03-08 (×2): 0.4 mg via ORAL
  Filled 2013-03-07 (×2): qty 1

## 2013-03-07 MED ORDER — FUROSEMIDE 10 MG/ML IJ SOLN
40.0000 mg | INTRAMUSCULAR | Status: AC
  Administered 2013-03-07: 40 mg via INTRAVENOUS
  Filled 2013-03-07: qty 4

## 2013-03-07 MED ORDER — ALLOPURINOL 100 MG PO TABS
200.0000 mg | ORAL_TABLET | Freq: Every day | ORAL | Status: DC
  Administered 2013-03-07 – 2013-03-08 (×2): 200 mg via ORAL
  Filled 2013-03-07 (×2): qty 2

## 2013-03-07 MED ORDER — FUROSEMIDE 10 MG/ML IJ SOLN: 60.0000 mg | Freq: Two times a day (BID) | INTRAMUSCULAR | Status: DC

## 2013-03-07 MED ORDER — FUROSEMIDE 10 MG/ML IJ SOLN
60.0000 mg | Freq: Three times a day (TID) | INTRAMUSCULAR | Status: DC
  Administered 2013-03-07 – 2013-03-08 (×2): 60 mg via INTRAVENOUS
  Filled 2013-03-07 (×5): qty 6

## 2013-03-07 MED ORDER — SODIUM CHLORIDE 0.9 % IJ SOLN
3.0000 mL | Freq: Two times a day (BID) | INTRAMUSCULAR | Status: DC
  Administered 2013-03-07 – 2013-03-08 (×2): 3 mL via INTRAVENOUS

## 2013-03-07 MED ORDER — SODIUM CHLORIDE 0.9 % IV SOLN: 250.0000 mL | INTRAVENOUS | Status: DC | PRN

## 2013-03-07 MED ORDER — MAGNESIUM OXIDE 400 (241.3 MG) MG PO TABS
400.0000 mg | ORAL_TABLET | Freq: Every day | ORAL | Status: DC
  Administered 2013-03-08: 400 mg via ORAL
  Filled 2013-03-07 (×2): qty 1

## 2013-03-07 MED ORDER — FINASTERIDE 5 MG PO TABS
5.0000 mg | ORAL_TABLET | Freq: Every day | ORAL | Status: DC
Start: 2013-03-07 — End: 2013-03-08
  Administered 2013-03-07 – 2013-03-08 (×2): 5 mg via ORAL
  Filled 2013-03-07 (×2): qty 1

## 2013-03-07 NOTE — ED Provider Notes (Signed)
History     CSN: [id number]  Arrival date & time 03/07/13  4098   First MD Initiated Contact with Patient 03/07/13 [phone]      Chief Complaint  Patient presents with  . Cough  . Shortness of Breath    (Consider location/radiation/quality/duration/timing/severity/associated sxs/prior treatment) HPI Comments: Patient is a 78 year old male with a past medical history of metastatic prostate cancer to bone, CAD, previous MI in 2007, diabetes, and renal insufficiency who presents with a 1 week history of SOB. The symptoms started gradually and progressively worsened since the onset. Exertion makes the SOB worse. Nothing makes the SOB better. He reports associated productive cough with green sputum. Patient has not tried anything for symptoms. He denies fever, chest pain, abdominal pain, NVD.     Past Medical History  Diagnosis Date  . CAD (coronary artery disease)   . Hypertension   . Obesity   . Dyslipidemia   . Myocardial infarction 2007  . H/O allergic rhinitis   . Cancer     metastatic prostate cancer  . Anemia   . H/O: gout   . H/O hemorrhoids   . Pneumonia   . H/O: GI bleed   . Bone metastasis   . Diabetes mellitus   . Arthritis   . Rhinitis, allergic   . Carpal tunnel syndrome   . Renal insufficiency     ckd III  . Renal insufficiency   . Fatty liver   . Shoulder dislocation 1970    right    Past Surgical History  Procedure Laterality Date  . Transurethral resection of prostate  06/08/2011, 11/08/06  . Right hand surgery  1974    Family History  Problem Relation Age of Onset  . Diabetes Mother     History  Substance Use Topics  . Smoking status: Former Games developer  . Smokeless tobacco: Never Used     Comment: QUIT "YEARS" AGO  . Alcohol Use: No     Comment: former heavy use      Review of Systems  Respiratory: Positive for cough and shortness of breath.   All other systems reviewed and are negative.    Allergies  Aspirin and Feldene  Home  Medications   Current Outpatient Rx  Name  Route  Sig  Dispense  Refill  . allopurinol (ZYLOPRIM) 100 MG tablet   Oral   Take 200 mg by mouth daily.         Marland Kitchen amLODipine (NORVASC) 5 MG tablet   Oral   Take 5 mg by mouth daily.         . colchicine 0.6 MG tablet   Oral   Take 1 tablet (0.6 mg total) by mouth daily.   30 tablet   0   . finasteride (PROSCAR) 5 MG tablet   Oral   Take 1 tablet (5 mg total) by mouth daily.   30 tablet   0   . furosemide (LASIX) 20 MG tablet   Oral   Take 20 mg by mouth 2 (two) times daily.          . magnesium oxide (MAG-OX) 400 (241.3 MG) MG tablet   Oral   Take 1 tablet (400 mg total) by mouth daily.   30 tablet   0   . metoprolol (LOPRESSOR) 50 MG tablet   Oral   Take 50 mg by mouth 2 (two) times daily.           . nitroGLYCERIN (NITROSTAT) 0.4 MG  SL tablet   Sublingual   Place 0.4 mg under the tongue every 5 (five) minutes as needed.           Marland Kitchen oxyCODONE (OXY IR/ROXICODONE) 5 MG immediate release tablet   Oral   Take 1 tablet (5 mg total) by mouth every 6 (six) hours as needed.   45 tablet   0   . simvastatin (ZOCOR) 20 MG tablet   Oral   Take 20 mg by mouth at bedtime.           . sitaGLIPtin (JANUVIA) 25 MG tablet   Oral   Take 1 tablet (25 mg total) by mouth daily with breakfast.   30 tablet   0   . Tamsulosin HCl (FLOMAX) 0.4 MG CAPS   Oral   Take 0.4 mg by mouth daily.         . Vitamin D, Ergocalciferol, (DRISDOL) 50000 UNITS CAPS   Oral   Take 50,000 Units by mouth 2 (two) times a week.           BP 135/67  Pulse 83  Temp(Src) 97.4 F (36.3 C) (Oral)  Resp 20  SpO2 96%  Physical Exam  Nursing note and vitals reviewed. Constitutional: He is oriented to person, place, and time. He appears well-developed and well-nourished. No distress.  HENT:  Head: Normocephalic and atraumatic.  Eyes: Conjunctivae and EOM are normal. No scleral icterus.  Neck: Normal range of motion.   Cardiovascular: Normal rate and regular rhythm.  Exam reveals no gallop and no friction rub.   No murmur heard. No leg edema noted.   Pulmonary/Chest: Effort normal. He has no wheezes. He has no rales. He exhibits no tenderness.  Rhonchi and rales noted throughout bilateral lung fields. R>L  Abdominal: Soft. He exhibits no distension. There is no tenderness. There is no rebound and no guarding.  Musculoskeletal: Normal range of motion.  Neurological: He is alert and oriented to person, place, and time. Coordination normal.  Speech is goal-oriented. Moves limbs without ataxia.   Skin: Skin is warm and dry.  Psychiatric: He has a normal mood and affect. His behavior is normal.    ED Course  Procedures (including critical care time)  Labs Reviewed  CBC WITH DIFFERENTIAL - Abnormal; Notable for the following:    RBC 3.45 (*)    Hemoglobin 8.9 (*)    HCT 26.4 (*)    MCV 76.5 (*)    MCH 25.8 (*)    RDW 17.7 (*)    Neutrophils Relative 80 (*)    All other components within normal limits  BASIC METABOLIC PANEL - Abnormal; Notable for the following:    CO2 17 (*)    BUN 38 (*)    Creatinine, Ser 2.79 (*)    GFR calc non Af Amer 21 (*)    GFR calc Af Amer 24 (*)    All other components within normal limits  URINALYSIS, ROUTINE W REFLEX MICROSCOPIC - Abnormal; Notable for the following:    Color, Urine RED (*)    APPearance CLOUDY (*)    Hgb urine dipstick LARGE (*)    Ketones, ur 15 (*)    Protein, ur 100 (*)    Leukocytes, UA MODERATE (*)    All other components within normal limits  PRO B NATRIURETIC PEPTIDE - Abnormal; Notable for the following:    Pro B Natriuretic peptide (BNP) 22646.0 (*)    All other components within normal limits  URINE CULTURE  TROPONIN I  LACTIC ACID, PLASMA  URINE MICROSCOPIC-ADD ON  POCT I-STAT TROPONIN I   Dg Chest 2 View  03/07/2013  *RADIOLOGY REPORT*  Clinical Data: Cough and shortness of breath.  Metastatic prostate cancer.  CHEST - 2 VIEW   Comparison: 02/03/2013  Findings: Osseous metastasis. Midline trachea.  Mild cardiomegaly. Probable small right pleural effusion. No pneumothorax.  Mild interstitial prominence, similar to the prior exam.  Patchy right greater than left bibasilar airspace disease.  Possible airspace disease in the inferior right lower lobe.  IMPRESSION: Cardiomegaly with similar mild interstitial prominence.  Suspect mild pulmonary venous congestion, without overt congestive failure.  Right greater than left patchy pulmonary opacities which could represent foci of atelectasis or early infection.  Consider short- term radiographic follow-up.  Probable small right pleural effusion.  Osseous metastasis, as before.   Original Report Authenticated By: Jeronimo Greaves, M.D.      1. CHF (congestive heart failure)       MDM  9:00 AM Labs, chest xray, and urinalysis pending.   10:22 AM Labs unremarkable for acute changes. BNP and urinalysis still pending. Chest xray shows mild pulmonary vascular congestion and right sided patchy opacities concerning for early infection.   11:52 AM BNP is elevated. I will admit the patient for CHF vs. Pneumonia.   Patient admitted to Dr. Jerral Ralph.   Emilia Beck, PA-C 03/07/13 1408

## 2013-03-07 NOTE — ED Notes (Addendum)
Pt reports: "I'm sick!" Pt complains of cough and shortness of breath with exertion, no chest pain. Hx of pneumonia

## 2013-03-07 NOTE — ED Notes (Signed)
Mason Jim, pt daughter [phone] Call with updates.

## 2013-03-07 NOTE — Progress Notes (Signed)
Patient ID: [name], male   DOB: Mar 24, 1935, 78 y.o.   MRN: [id number]   CARDIOLOGY CONSULT NOTE  Patient ID: [name] MRN: [id number], DOB/AGE: September 27, 1935   Admit date: 03/07/2013 Date of Consult: 03/07/2013   Primary Physician: Leo Grosser, MD Primary Cardiologist: Rollene Rotunda MD  Pt. Profile 78 year old black gentleman admitted with progressive shortness of breath and acute on chronic diastolic heart failure. I was asked by Dr.Ghamire to evaluate and participate in his management.  Problem List  Past Medical History  Diagnosis Date  . CAD (coronary artery disease)   . Hypertension   . Obesity   . Dyslipidemia   . Myocardial infarction 2007  . H/O allergic rhinitis   . Cancer     metastatic prostate cancer  . Anemia   . H/O: gout   . H/O hemorrhoids   . Pneumonia   . H/O: GI bleed   . Bone metastasis   . Diabetes mellitus   . Arthritis   . Rhinitis, allergic   . Carpal tunnel syndrome   . Renal insufficiency     ckd III  . Renal insufficiency   . Fatty liver   . Shoulder dislocation 1970    right    Past Surgical History  Procedure Laterality Date  . Transurethral resection of prostate  06/08/2011, 11/08/06  . Right hand surgery  1974     Allergies  Allergies  Allergen Reactions  . Aspirin Other (See Comments)    "makes my stomach hurt real bad."  . Feldene (Piroxicam) Swelling    HPI  He is a poor historian. He has had progressive shortness of breath over the last week or so. He's had painful swelling of his lower extremities. He denies orthopnea, PND but does have significant dyspnea on exertion. His is comfortable lying down presently off of O2. I am not sure about compliance. He denies any chest pain or angina.  His last echocardiogram was 2011. This shows normal left ventricular systolic function, no EF given, and grade 1 diastolic dysfunction. He does have a history of hypertension. His past medical history shows coronary artery  disease and old MI but not well documented. He has not seen a cardiologist in over 3 years.  He lives at home alone. He is retired Naval architect. He has 2 daughters who live in [place].  Other complicating factors include metastatic prostate cancer, chronic anemia, chronic kidney disease level, and other multiple comorbidities.  Creatinine is elevated at 2.7, BNP is over 12,000, chest x-ray shows interstitial edema with small bilateral pleural effusions, and EKG shows interventricular conduction delay consistent with left bundle branch block. Initial cardiac markers are negative. Hemoglobin is 8.9  Inpatient Medications    Family History Family History  Problem Relation Age of Onset  . Diabetes Mother      Social History History   Social History  . Marital Status: Divorced    Spouse Name: N/A    Number of Children: N/A  . Years of Education: N/A   Occupational History  . truck driver    Social History Main Topics  . Smoking status: Former Games developer  . Smokeless tobacco: Never Used     Comment: QUIT "YEARS" AGO  . Alcohol Use: No     Comment: former heavy use  . Drug Use: No  . Sexually Active: No   Other Topics Concern  . Not on file   Social History Narrative  . No narrative on file     Review  of Systems  General:  No chills, fever, night sweats or weight changes.  Cardiovascular:  No chest pain, dyspnea on exertion, edema, orthopnea, palpitations, paroxysmal nocturnal dyspnea. Dermatological: No rash, lesions/masses Respiratory: No cough, dyspnea Urologic: No hematuria, dysuria Abdominal:   No nausea, vomiting, diarrhea, bright red blood per rectum, melena, or hematemesis Neurologic:  No visual changes, wkns, changes in mental status. All other systems reviewed and are otherwise negative except as noted above.  Physical Exam  Blood pressure 134/65, pulse 79, temperature 97.4 F (36.3 C), temperature source Oral, resp. rate 18, SpO2 94.00%.  General:  Pleasant, NAD, looks older than stated age, somewhat disheveled Psych: Normal affect. Neuro: Alert and oriented X 3. Moves all extremities spontaneously. HEENT: Normal, bearded, poor dentition  Neck: Supple without bruits, remarkable JVD Lungs:  Resp regular and unlabored, CTA. Heart: RRR no s3, s4, or murmurs. PMI displaced inferolaterally Abdomen: Soft, non-tender, non-distended, BS + x 4.  Extremities: No clubbing, cyanosis, requesting edema pretibially. DP/PT/Radials 1+ and equal bilaterally.  Labs   Recent Labs  03/07/13 1010  TROPONINI <0.30   Lab Results  Component Value Date   WBC 5.3 03/07/2013   HGB 8.9* 03/07/2013   HCT 26.4* 03/07/2013   MCV 76.5* 03/07/2013   PLT 221 03/07/2013    Recent Labs Lab 03/07/13 0835  NA 136  K 4.9  CL 106  CO2 17*  BUN 38*  CREATININE 2.79*  CALCIUM 9.2  GLUCOSE 75   Lab Results  Component Value Date   CHOL 125 04/25/2011   HDL 52 04/25/2011   LDLCALC  Value: 59        Total Cholesterol/HDL:CHD Risk Coronary Heart Disease Risk Table                     Men   Women  1/2 Average Risk   3.4   3.3  Average Risk       5.0   4.4  2 X Average Risk   9.6   7.1  3 X Average Risk  23.4   11.0        Use the calculated Patient Ratio above and the CHD Risk Table to determine the patient's CHD Risk.        ATP III CLASSIFICATION (LDL):  <100     mg/dL   Optimal  454-098  mg/dL   Near or Above                    Optimal  130-159  mg/dL   Borderline  119-147  mg/dL   High  >829     mg/dL   Very High 5/62/1308   TRIG 70 04/25/2011   No results found for this basename: DDIMER    Radiology/Studies  Dg Chest 2 View  03/07/2013  *RADIOLOGY REPORT*  Clinical Data: Cough and shortness of breath.  Metastatic prostate cancer.  CHEST - 2 VIEW  Comparison: 02/03/2013  Findings: Osseous metastasis. Midline trachea.  Mild cardiomegaly. Probable small right pleural effusion. No pneumothorax.  Mild interstitial prominence, similar to the prior exam.  Patchy right  greater than left bibasilar airspace disease.  Possible airspace disease in the inferior right lower lobe.  IMPRESSION: Cardiomegaly with similar mild interstitial prominence.  Suspect mild pulmonary venous congestion, without overt congestive failure.  Right greater than left patchy pulmonary opacities which could represent foci of atelectasis or early infection.  Consider short- term radiographic follow-up.  Probable small right pleural effusion.  Osseous  metastasis, as before.   Original Report Authenticated By: Jeronimo Greaves, M.D.    Nm Bone Scan Whole Body  02/05/2013  *RADIOLOGY REPORT*  Clinical Data: Follow up prostate cancer metastasis  NUCLEAR MEDICINE WHOLE BODY BONE SCINTIGRAPHY  Technique:  Whole body anterior and posterior images were obtained approximately 3 hours after intravenous injection of radiopharmaceutical.  Radiopharmaceutical: CURIE TC-MDP TECHNETIUM TC 18M MEDRONATE IV KIT  Comparison: 02/09/2012  Findings: Multifocal abnormal areas of increased radiotracer uptake are noted within the axial and appendicular skeleton consistent with prostate cancer metastasis. Compared with the previous exam there has been progression of prostate cancer metastasis.  The large lesion within the proximal right proximal humeral has increased in size compared with previous exam. There has also been increase in size of the proximal left femur lesion. Significant increase in size of sacral metastasis.  Increase in the number of the thoracic and rib metastasis.  There is faint physiologic tracer uptake noted within the kidneys and bladder.  IMPRESSION:  1.  Significant interval progression of the bone metastases.   Original Report Authenticated By: Signa Kell, M.D.    US Renal  02/07/2013  *RADIOLOGY REPORT*  Clinical Data: Follow up bladder decompression  RENAL/URINARY TRACT ULTRASOUND COMPLETE  Comparison:  02/03/2013  Findings:  Right Kidney:  Measures 10.6 cm.  Echogenic renal parenchyma,  suggesting medical renal disease.  11 x 5 x 9 mm interpolar cyst. Mild hydronephrosis.  Left Kidney:  Measures 11.6 cm.  Echogenic renal parenchyma, suggesting medical renal disease.  Moderate hydronephrosis.  Bladder:  7.3 x 7.6 x 8.1 cm heterogeneous mass/debris within an otherwise decompressed bladder with indwelling Foley catheter.  IMPRESSION: Mild right and moderate left hydronephrosis.  Stable bladder mass/debris.  Indwelling Foley catheter.   Original Report Authenticated By: Charline Bills, M.D.    Dg Abd Portable 2v  02/07/2013  *RADIOLOGY REPORT*  Clinical Data: Enteritis  PORTABLE ABDOMEN - 2 VIEW  Comparison: CT 02/03/2013, KUB 02/03/2013  Findings: Mildly distended small bowel loops, similar to the prior study.  No air-fluid levels on the decubitus view.  There is gas in nondilated colon.  Air-fluid levels are not prominent on the decubitus view.  No free air.  IMPRESSION: No significant change.  Possible gastroenteritis.  Partial small bowel obstruction also possible.   Original Report Authenticated By: Janeece Riggers, M.D.     ECG Normal sinus rhythm, interventricular conduction delay consistent with left bundle  ASSESSMENT AND PLAN  #1 acute on chronic diastolic heart failure. Echocardiogram has not been done since 2011 so will repeat. Is complicated by chronic kidney disease level III, severe anemia, metastatic prostate cancer, chronic lung disease, as well as her potential noncompliance.  #2 multiple other comorbidities as mentioned past medical history, history of present illness, and problem list.  Recommendation #1 IV diuresis with Lasix 80 mg Q8 #2 follow daily chemistries electrolytes #3 repeat echocardiogram #4 cancel troponins #5 pallative consult  #6 care management consultation. If he has a [facility] primary care provider he will be able to obtain free care management.   Signed, Valera Castle, MD 03/07/2013, 2:45 PM

## 2013-03-07 NOTE — ED Notes (Signed)
Reports being in an rehab facility for approx 1 week after having radiation for his prostate. Pt was told while there that he had pneumonia. Pt states that he received PO antibiotics. Pt continues to have cough, chills, and feels generally weak with decreased appetite.

## 2013-03-07 NOTE — H&P (Signed)
PATIENT DETAILS Name: [name] Age: 78 y.o. Sex: male Date of Birth: 08/17/1935 Admit Date: 03/07/2013 ZOX:[id number] TOM, MD   CHIEF COMPLAINT:  Exertional dyspnea- for at least a month Bilateral lower extremity swelling- for at least a month Intermittent exertional chest pain   HPI: Patient is a 78 year old African American male with a prior history of coronary artery disease, chronic kidney disease stage IV, likely anemia of chronic disease, metastatic prostate cancer who presented to the ED for evaluation of the above-noted complaints. The patient is not the best of historians, however for the past one month or so he has been having worsening exertion or dyspnea, for the past few days it is difficult for him to even walk to the bathroom without having to stop for breath. He denies any recent orthopnea-but for the past 2 days has been sleeping upright in his car (heating problem with his trailer home), he denies any PND. He claims that he has had worsening lower extremity swelling for the past month or so as well. He claims that he's had a cough that is intermittently productive with white phlegm. There is no history of hemoptysis. He also claims that for the past one year or so he gets intermittent exertional chest pain, that is relieved by rest. He was evaluated in the ED, a chest x-ray showed possible pulmonary vascular congestion, BNP was significantly elevated, patient was given one dose of intravenous Lasix 40 mg and I was asked to admit this patient for further evaluation and treatment.   ALLERGIES:   Allergies  Allergen Reactions  . Aspirin Other (See Comments)    "makes my stomach hurt real bad."  . Feldene (Piroxicam) Swelling    PAST MEDICAL HISTORY: Past Medical History  Diagnosis Date  . CAD (coronary artery disease)   . Hypertension   . Obesity   . Dyslipidemia   . Myocardial infarction 2007  . H/O allergic rhinitis   . Cancer     metastatic prostate  cancer  . Anemia   . H/O: gout   . H/O hemorrhoids   . Pneumonia   . H/O: GI bleed   . Bone metastasis   . Diabetes mellitus   . Arthritis   . Rhinitis, allergic   . Carpal tunnel syndrome   . Renal insufficiency     ckd III  . Renal insufficiency   . Fatty liver   . Shoulder dislocation 1970    right    PAST SURGICAL HISTORY: Past Surgical History  Procedure Laterality Date  . Transurethral resection of prostate  06/08/2011, 11/08/06  . Right hand surgery  1974    MEDICATIONS AT HOME: Prior to Admission medications   Medication Sig Start Date End Date Taking? Authorizing Provider  allopurinol (ZYLOPRIM) 100 MG tablet Take 200 mg by mouth daily.    Historical Provider, MD  amLODipine (NORVASC) 5 MG tablet Take 5 mg by mouth daily.    Historical Provider, MD  colchicine 0.6 MG tablet Take 1 tablet (0.6 mg total) by mouth daily. 05/27/12   Kela Millin, MD  finasteride (PROSCAR) 5 MG tablet Take 1 tablet (5 mg total) by mouth daily. 02/13/13   Alison Murray, MD  furosemide (LASIX) 20 MG tablet Take 20 mg by mouth 2 (two) times daily.     Historical Provider, MD  magnesium oxide (MAG-OX) 400 (241.3 MG) MG tablet Take 1 tablet (400 mg total) by mouth daily. 02/13/13   Alison Murray, MD  metoprolol [place])  50 MG tablet Take 50 mg by mouth 2 (two) times daily.      Historical Provider, MD  nitroGLYCERIN (NITROSTAT) 0.4 MG SL tablet Place 0.4 mg under the tongue every 5 (five) minutes as needed.      Historical Provider, MD  oxyCODONE (OXY IR/ROXICODONE) 5 MG immediate release tablet Take 1 tablet (5 mg total) by mouth every 6 (six) hours as needed. 02/13/13   Alison Murray, MD  simvastatin (ZOCOR) 20 MG tablet Take 20 mg by mouth at bedtime.      Historical Provider, MD  sitaGLIPtin (JANUVIA) 25 MG tablet Take 1 tablet (25 mg total) by mouth daily with breakfast. 05/27/12 05/27/13  Kela Millin, MD  Tamsulosin HCl (FLOMAX) 0.4 MG CAPS Take 0.4 mg by mouth daily.    Historical  Provider, MD  Vitamin D, Ergocalciferol, (DRISDOL) 50000 UNITS CAPS Take 50,000 Units by mouth 2 (two) times a week.    Historical Provider, MD    FAMILY HISTORY: Family History  Problem Relation Age of Onset  . Diabetes Mother     SOCIAL HISTORY:  reports that he has quit smoking. He has never used smokeless tobacco. He reports that he does not drink alcohol or use illicit drugs.  REVIEW OF SYSTEMS:  Constitutional:   No  weight loss, night sweats,  Fevers, chills, fatigue.  HEENT:    No headaches, Difficulty swallowing,Tooth/dental problems,Sore throat,  No sneezing, itching, ear ache, nasal congestion, post nasal drip,   Cardio-vascular: No anasarca,  dizziness, palpitations  GI:  No heartburn, indigestion, abdominal pain, nausea, vomiting, diarrhea, change in  bowel habits, loss of appetite  Resp: No shortness of breath  at rest.  No excess mucus, no productive cough,  No coughing up of blood.No change in color of mucus.No wheezing.No chest wall deformity  Skin:  no rash or lesions.  GU:  no dysuria, change in color of urine, no urgency or frequency.  No flank pain.  Musculoskeletal: No joint pain or swelling.  No decreased range of motion.  No back pain.  Psych: No change in mood or affect. No depression or anxiety.  No memory loss.   PHYSICAL EXAM: Blood pressure 134/65, pulse 79, temperature 97.4 F (36.3 C), temperature source Oral, resp. rate 18, SpO2 94.00%.  General appearance :Awake, alert, not in any distress. Speech Clear. Not toxic Looking HEENT: Atraumatic and Normocephalic, pupils equally reactive to light and accomodation Neck: supple, prominent neck veins. No cervical lymphadenopathy.  Chest:Good air entry bilaterally, few bibasilar rales  CVS: S1 S2 regular, no murmurs.  Abdomen: Bowel sounds present, Non tender and not distended with no gaurding, rigidity or rebound. Extremities: B/L Lower Ext shows 3 +edema, both legs are warm to  touch Neurology: Awake alert, and oriented X 3, CN II-XII intact, Non focal Skin:No Rash Wounds:N/A  LABS ON ADMISSION:   Recent Labs  03/07/13 0835  NA 136  K 4.9  CL 106  CO2 17*  GLUCOSE 75  BUN 38*  CREATININE 2.79*  CALCIUM 9.2   No results found for this basename: AST, ALT, ALKPHOS, BILITOT, PROT, ALBUMIN,  in the last 72 hours No results found for this basename: LIPASE, AMYLASE,  in the last 72 hours  Recent Labs  03/07/13 0835  WBC 5.3  NEUTROABS 4.3  HGB 8.9*  HCT 26.4*  MCV 76.5*  PLT 221    Recent Labs  03/07/13 1010  TROPONINI <0.30   No results found for this basename: DDIMER,  in  the last 72 hours No components found with this basename: POCBNP,    RADIOLOGIC STUDIES ON ADMISSION: Dg Chest 2 View  03/07/2013  *RADIOLOGY REPORT*  Clinical Data: Cough and shortness of breath.  Metastatic prostate cancer.  CHEST - 2 VIEW  Comparison: 02/03/2013  Findings: Osseous metastasis. Midline trachea.  Mild cardiomegaly. Probable small right pleural effusion. No pneumothorax.  Mild interstitial prominence, similar to the prior exam.  Patchy right greater than left bibasilar airspace disease.  Possible airspace disease in the inferior right lower lobe.  IMPRESSION: Cardiomegaly with similar mild interstitial prominence.  Suspect mild pulmonary venous congestion, without overt congestive failure.  Right greater than left patchy pulmonary opacities which could represent foci of atelectasis or early infection.  Consider short- term radiographic follow-up.  Probable small right pleural effusion.  Osseous metastasis, as before.   Original Report Authenticated By: Jeronimo Greaves, M.D.     ASSESSMENT AND PLAN: Present on Admission:  . CHF exacerbation - This is a new onset/new diagnoses for this patient  - Systolic versus diastolic dysfunction-suspect the latter - For now, attempt to diurese with intravenous furosemide, continue with beta blocker.Not a candidate for ACE  inhibitor given advanced chronic kidney disease  - Check a 2-D echocardiogram, will ask cardiology to evaluate as this is new onset.   . Chest pain on exertion - Apparently had a non-STEMI in 2007-because of chronic kidney disease cardiac catheterization was not done. - Sounds like this is stable angina, would admit and cycle cardiac enzymes. He is allergic to aspirin, if he rules in will need to start Plavix.  . Anemia of chronic disease - Will check anemia panel, likely anemia of chronic disease, hemoglobin very close to her usual baseline   . Chronic renal insufficiency - Does have a history of diabetes and hypertension, suspect chronic kidney disease secondary to these diagnoses. Suspect that recent elevation in creatinine was because of post-renal issues i.e. prostate CA-creatinine seems to be done pending, he does have a Foley catheter in place chronically.  - Monitor creatinine closely as he would be on diuretics   . Diabetes mellitus - Will stop Januvia, place on SSI await inpatient   . Essential hypertension, benign - continue with metoprolol, hold amlodipine given leg edema. Will be on Lasix.  - Monitor blood pressure and adjust medications according   . H/O: gout - Continue with allopurinol  . HYPERLIPIDEMIA - Continue with statin   . Metastatic prostate cancer to bone.  - Follows with urology in the outpatient setting.   Further plan will depend as patient's clinical course evolves and further radiologic and laboratory data become available. Patient will be monitored closely.   DVT Prophylaxis: - Prophylactic heparin  Code Status: - Full code  Total time spent for admission equals 45 minutes.  [facility] Pager [phone]  If 7PM-7AM, please contact night-coverage [email or web address] Password [email or web address] 03/07/2013, 2:19 PM

## 2013-03-08 DIAGNOSIS — I059 Rheumatic mitral valve disease, unspecified: Secondary | ICD-10-CM

## 2013-03-08 DIAGNOSIS — E119 Type 2 diabetes mellitus without complications: Secondary | ICD-10-CM

## 2013-03-08 DIAGNOSIS — I509 Heart failure, unspecified: Secondary | ICD-10-CM

## 2013-03-08 LAB — HEPATIC FUNCTION PANEL
ALT: 9 U/L (ref 0–53)
AST: 18 U/L (ref 0–37)
Albumin: 2.5 g/dL — ABNORMAL LOW (ref 3.5–5.2)
Bilirubin, Direct: 0.2 mg/dL (ref 0.0–0.3)
Total Protein: 5.8 g/dL — ABNORMAL LOW (ref 6.0–8.3)

## 2013-03-08 LAB — BASIC METABOLIC PANEL
BUN: 35 mg/dL — ABNORMAL HIGH (ref 6–23)
CO2: 22 mEq/L (ref 19–32)
Calcium: 8.9 mg/dL (ref 8.4–10.5)
Creatinine, Ser: 2.9 mg/dL — ABNORMAL HIGH (ref 0.50–1.35)
GFR calc non Af Amer: 20 mL/min — ABNORMAL LOW (ref >90)
Glucose, Bld: 83 mg/dL (ref 70–99)

## 2013-03-08 LAB — GLUCOSE, CAPILLARY

## 2013-03-08 LAB — TROPONIN I: Troponin I: <0.3 ng/mL (ref <0.30)

## 2013-03-08 MED ORDER — FUROSEMIDE 40 MG PO TABS
40.0000 mg | ORAL_TABLET | Freq: Two times a day (BID) | ORAL | Status: DC
  Filled 2013-03-08 (×2): qty 1

## 2013-03-08 MED ORDER — FUROSEMIDE 40 MG PO TABS: 40.0000 mg | ORAL_TABLET | Freq: Two times a day (BID) | ORAL | Status: DC

## 2013-03-08 MED ORDER — COLCHICINE 0.6 MG PO TABS: 0.3000 mg | ORAL_TABLET | Freq: Every day | ORAL | Status: DC

## 2013-03-08 NOTE — Progress Notes (Signed)
Patient discharged and discharge information given, no other questions at this time. Family given heart failure education book.

## 2013-03-08 NOTE — Progress Notes (Signed)
Utilization Review Completed.   Sarahy Creedon, RN, BSN Nurse Case Manager  [phone]  

## 2013-03-08 NOTE — Progress Notes (Signed)
Physical Therapy Evaluation Patient Details Name: [name] MRN: [id number] DOB: 02/22/1935 Today's Date: 03/08/2013 Time: 8657-8469 PT Time Calculation (min): 19 min  PT Assessment / Plan / Recommendation Clinical Impression  Pt is a 78 year old male with CHF. Pt tolerated treatment well; complaint of no pain; ambulated well and is being discharged. Recommend cardiac rehab to address fatigue and aerobic endurance.     PT Assessment  Patient needs continued PT services    Follow Up Recommendations  No PT follow up    Does the patient have the potential to tolerate intense rehabilitation    NA  Barriers to Discharge None None    Equipment Recommendations  None recommended by PT    Recommendations for Other Services Other (comment) (Cardiac Rehab)   Frequency Min 3X/week    Precautions / Restrictions Restrictions Weight Bearing Restrictions: No   Pertinent Vitals/Pain See vitals flow sheet      Mobility  Transfers Transfers: Sit to Stand;Stand to Sit Sit to Stand: 7: Independent Stand to Sit: 7: Independent Ambulation/Gait Ambulation/Gait Assistance: 7: Independent Ambulation Distance (Feet): 150 Feet Assistive device: None Gait Pattern: Lateral trunk lean to right General Gait Details: Pt ambulated well- required a rest break Stairs: No     PT Goals Acute Rehab PT Goals PT Goal Formulation: With patient Time For Goal Achievement: 03/15/13 Potential to Achieve Goals: Good Pt will Ambulate: >150 feet;Independently (without fatigue) PT Goal: Ambulate - Progress: Goal set today  Visit Information  Last PT Received On: 03/08/13 Assistance Needed: +1    Subjective Data  Subjective: Pt reports no pain and is ready to go home.   Prior Functioning  Home Living Lives With: Alone Available Help at Discharge: Family Type of Home: Mobile home Home Access: Level entry Home Layout: One level Bathroom Shower/Tub: Engineer, manufacturing systems:  Standard Bathroom Accessibility: Yes How Accessible: Accessible via walker Home Adaptive Equipment: None Prior Function Level of Independence: Independent Driving: Yes Vocation: Retired Musician: No difficulties Dominant Hand: Right    Cognition  Cognition Arousal/Alertness: Awake/alert Orientation Level: Appears intact for tasks assessed Behavior During Session: Agitated    Extremity/Trunk Assessment Right Lower Extremity Assessment RLE ROM/Strength/Tone: Within functional levels RLE Sensation: WFL - Light Touch Left Lower Extremity Assessment LLE ROM/Strength/Tone: Within functional levels LLE Sensation: WFL - Light Touch       End of Session PT - End of Session Equipment Utilized During Treatment: Gait belt Activity Tolerance: Patient tolerated treatment well Patient left: in bed;with call bell/phone within reach;with family/visitor present     Lurena Joiner B. Jniyah Dantuono, PT, DPT [phone]   03/08/2013, 4:43 PM

## 2013-03-08 NOTE — Progress Notes (Signed)
03/08/13 1330 In to complete Heart Failure Home Health Screen.  Pt. states he takes his own medicines good, and he can take care of himself.  "I don't need any help.  I don't want to go to a rehab facility either, I want to go home."  At this time, pt. does not want any home health services.  NCM will follow for any further dc needs. Tera Mater, RN, BSN NCM [phone]

## 2013-03-08 NOTE — Progress Notes (Signed)
Patient: [name] / Admit Date: 03/07/2013 / Date of Encounter: 03/08/2013, 8:04 AM   Subjective  Breathing feels better. Still with LEE.   Objective   Telemetry: NSR occ PVC Physical Exam: Filed Vitals:   03/08/13 0538  BP: 116/56  Pulse: 67  Temp: 98.2 F (36.8 C)  Resp: 20   General: Well developed AAM in no acute distress, laying about 20 degrees in bed Head: Normocephalic, atraumatic, sclera non-icteric, no xanthomas, nares are without discharge. Neck: Negative for carotid bruits. JVD mod elevated. Lungs: Coarse at bases, otherwise no wheezes, rales, or rhonchi. Breathing is unlabored. Heart: RRR S1 S2 without murmurs, rubs, or gallops.  Abdomen: Soft, non-tender, non-distended with normoactive bowel sounds. No hepatomegaly. No rebound/guarding. No obvious abdominal masses. Msk:  Strength and tone appear normal for age. Extremities: No clubbing or cyanosis. 1+ bilateral LE edema, R slightly more than L.  Distal pedal pulses are 2+ and equal bilaterally. Neuro: Alert and oriented X 3. Moves all extremities spontaneously. Psych:  Responds to questions appropriately with an agitated affect.    Intake/Output Summary (Last 24 hours) at 03/08/13 0804 Last data filed at 03/08/13 0703  Gross per 24 hour  Intake      3 ml  Output   3125 ml  Net  -3122 ml    Inpatient Medications:  . allopurinol  200 mg Oral Daily  . colchicine  0.6 mg Oral Daily  . finasteride  5 mg Oral Daily  . furosemide  60 mg Intravenous Q8H  . heparin  5,000 Units Subcutaneous Q8H  . insulin aspart  0-9 Units Subcutaneous TID WC  . magnesium oxide  400 mg Oral Daily  . metoprolol  50 mg Oral BID  . simvastatin  20 mg Oral QHS  . sodium chloride  3 mL Intravenous Q12H  . tamsulosin  0.4 mg Oral Daily  . Vitamin D (Ergocalciferol)  50,000 Units Oral 2 times weekly    Labs:  Recent Labs  03/07/13 0835 03/07/13 1609 03/08/13 0415  NA 136  --  140  K 4.9  --  4.4  CL 106  --  109  CO2  17*  --  22  GLUCOSE 75  --  83  BUN 38*  --  35*  CREATININE 2.79* 2.88* 2.90*  CALCIUM 9.2  --  8.9   No results found for this basename: AST, ALT, ALKPHOS, BILITOT, PROT, ALBUMIN,  in the last 72 hours  Recent Labs  03/07/13 0835 03/07/13 1609  WBC 5.3 5.0  NEUTROABS 4.3  --   HGB 8.9* 8.4*  HCT 26.4* 24.6*  MCV 76.5* 75.5*  PLT 221 216    Recent Labs  03/07/13 1010 03/07/13 1609 03/07/13 2119 03/08/13 0415  TROPONINI <0.30 <0.30 <0.30 <0.30   Radiology/Studies:  Dg Chest 2 View 03/07/2013  *RADIOLOGY REPORT*  Clinical Data: Cough and shortness of breath.  Metastatic prostate cancer.  CHEST - 2 VIEW  Comparison: 02/03/2013  Findings: Osseous metastasis. Midline trachea.  Mild cardiomegaly. Probable small right pleural effusion. No pneumothorax.  Mild interstitial prominence, similar to the prior exam.  Patchy right greater than left bibasilar airspace disease.  Possible airspace disease in the inferior right lower lobe.  IMPRESSION: Cardiomegaly with similar mild interstitial prominence.  Suspect mild pulmonary venous congestion, without overt congestive failure.  Right greater than left patchy pulmonary opacities which could represent foci of atelectasis or early infection.  Consider short- term radiographic follow-up.  Probable small right pleural effusion.  Osseous metastasis, as before.   Original Report Authenticated By: Jeronimo Greaves, M.D.    US Renal 02/07/2013  *RADIOLOGY REPORT*  Clinical Data: Follow up bladder decompression  RENAL/URINARY TRACT ULTRASOUND COMPLETE  Comparison:  02/03/2013  Findings:  Right Kidney:  Measures 10.6 cm.  Echogenic renal parenchyma, suggesting medical renal disease.  11 x 5 x 9 mm interpolar cyst. Mild hydronephrosis.  Left Kidney:  Measures 11.6 cm.  Echogenic renal parenchyma, suggesting medical renal disease.  Moderate hydronephrosis.  Bladder:  7.3 x 7.6 x 8.1 cm heterogeneous mass/debris within an otherwise decompressed bladder with  indwelling Foley catheter.  IMPRESSION: Mild right and moderate left hydronephrosis.  Stable bladder mass/debris.  Indwelling Foley catheter.   Original Report Authenticated By: Charline Bills, M.D.    2D Echo 2011: Study Conclusions Left ventricle: Overall LVEF is normal. Cannot evaluate regional wall motion as endocardium is not well seen. The cavity size was normal. Wall thickness was increased in a pattern of mild LVH. Doppler parameters are consistent with abnormal left ventricular relaxation (grade 1 diastolic dysfunction).    Assessment and Plan  Mr. Fluke is a 78 y/o M with history of CAD s/p MI (not previously felt to be cath candidate), lower GI bleeding (hemorrhoids s/p banding 2012, prior duodenitis without hemorrhage but requiring multiple blood transfusions 2007), metastatic prostate cancer presented with SOB, elevated pBNP.  1. Acute on chronic presumed diastolic CHF 2. Acute on chronic anemia with history of GI bleeding/transfusion 3. CKD 4. Metastatic prostate cancer with bladder mass seen on recent imaging 5. History of CAD, treated medically 6. Poor social situation (pt apparently was having to sleep upright in his car, heating problem with his trailer home)   Renal function remains stable with diuresis. Good diuresis yesterday - awaiting today's weight. Will discuss diuretic dosing with MD. Cont BB/statin. Not currently on ASA due to allergy/anemia. Management of anemia per IM, noting hematuria on UA and bladder mass on recent renal US.   With regard to his poor social situation, CM/SW have been consulted. I wonder if there may be a component of poor nutrition leading to his edema. Add LFTs to assess albumin. Will order nutrition consult to educate regarding CHF diet. I am not sure if he will be receptive to speaking with them but we will give it a shot.  Signed, Dayna Dunn PA-C Paitient examined. He is much improved from admission. Today's weight is pending. Less  orthopnea. Edema is improving. Will switch to oral lasix today. Echo pending. Agree with assessment above.

## 2013-03-08 NOTE — Discharge Summary (Signed)
Physician Discharge Summary  Patient ID: [name] MRN: [id number] DOB/AGE: [birth year] 78 y.o.  Admit date: 03/07/2013 Discharge date: 03/08/2013  Primary Care Physician:  Lynnea Ferrier TOM, MD   Discharge Diagnoses:    Principal Problem:   CHF exacerbation Active Problems:   HYPERLIPIDEMIA   Essential hypertension, benign   H/O: gout   Chronic renal insufficiency   Diabetes mellitus   Metastatic prostate cancer to bone.    Anemia of chronic disease   Chest pain on exertion      Medication List    TAKE these medications       allopurinol 100 MG tablet  Commonly known as:  ZYLOPRIM  Take 200 mg by mouth daily.     amLODipine 5 MG tablet  Commonly known as:  NORVASC  Take 5 mg by mouth daily.     colchicine 0.6 MG tablet  Take 1 tablet (0.6 mg total) by mouth daily.     finasteride 5 MG tablet  Commonly known as:  PROSCAR  Take 1 tablet (5 mg total) by mouth daily.     furosemide 40 MG tablet  Commonly known as:  LASIX  Take 1 tablet (40 mg total) by mouth 2 (two) times daily.     magnesium oxide 400 (241.3 MG) MG tablet  Commonly known as:  MAG-OX  Take 1 tablet (400 mg total) by mouth daily.     metoprolol 50 MG tablet  Commonly known as:  LOPRESSOR  Take 50 mg by mouth 2 (two) times daily.     nitroGLYCERIN 0.4 MG SL tablet  Commonly known as:  NITROSTAT  Place 0.4 mg under the tongue every 5 (five) minutes as needed.     oxyCODONE 5 MG immediate release tablet  Commonly known as:  Oxy IR/ROXICODONE  Take 1 tablet (5 mg total) by mouth every 6 (six) hours as needed.     simvastatin 20 MG tablet  Commonly known as:  ZOCOR  Take 20 mg by mouth at bedtime.     sitaGLIPtin 25 MG tablet  Commonly known as:  JANUVIA  Take 1 tablet (25 mg total) by mouth daily with breakfast.     tamsulosin 0.4 MG Caps  Commonly known as:  FLOMAX  Take 0.4 mg by mouth daily.     Vitamin D (Ergocalciferol) 50000 UNITS Caps  Commonly known as:  DRISDOL  Take  50,000 Units by mouth 2 (two) times a week.         Disposition and Follow-up:  Will be discharged home today in improved condition. Please follow results of ECHO. He has been advised to follow up with his PCP in 1 week.  Consults:  Cardiology, Dr. Patty Sermons   Significant Diagnostic Studies:  Dg Chest 2 View  03/07/2013  *RADIOLOGY REPORT*  Clinical Data: Cough and shortness of breath.  Metastatic prostate cancer.  CHEST - 2 VIEW  Comparison: 02/03/2013  Findings: Osseous metastasis. Midline trachea.  Mild cardiomegaly. Probable small right pleural effusion. No pneumothorax.  Mild interstitial prominence, similar to the prior exam.  Patchy right greater than left bibasilar airspace disease.  Possible airspace disease in the inferior right lower lobe.  IMPRESSION: Cardiomegaly with similar mild interstitial prominence.  Suspect mild pulmonary venous congestion, without overt congestive failure.  Right greater than left patchy pulmonary opacities which could represent foci of atelectasis or early infection.  Consider short- term radiographic follow-up.  Probable small right pleural effusion.  Osseous metastasis, as before.   Original Report Authenticated By:  Jeronimo Greaves, M.D.     Brief H and P: For complete details please refer to admission H and P, but in brief patient is a 78 year old [place] American male with a prior history of coronary artery disease, chronic kidney disease stage IV, likely anemia of chronic disease, metastatic prostate cancer who presented to the ED for evaluation of SOB and LE edema. The patient is not the best of historians, however for the past one month or so he has been having worsening exertion or dyspnea, for the past few days it is difficult for him to even walk to the bathroom without having to stop for breath. He denies any recent orthopnea-but for the past 2 days has been sleeping upright in his car (heating problem with his trailer home), he denies any PND. He  claims that he has had worsening lower extremity swelling for the past month or so as well. He claims that he's had a cough that is intermittently productive with white phlegm. There is no history of hemoptysis.  He also claims that for the past one year or so he gets intermittent exertional chest pain, that is relieved by rest.  He was evaluated in the ED, a chest x-ray showed possible pulmonary vascular congestion, BNP was significantly elevated, patient was given one dose of intravenous Lasix 40 mg and I was asked to admit this patient for further evaluation and treatment.     Hospital Course:  Principal Problem:   CHF exacerbation Active Problems:   HYPERLIPIDEMIA   Essential hypertension, benign   H/O: gout   Chronic renal insufficiency   Diabetes mellitus   Metastatic prostate cancer to bone.    Anemia of chronic disease   Chest pain on exertion    SOB/LE Edema -With findings of edema on CXR and very elevated BNP, suspect he has acute CHF. -ECHO has been done but results are pending at time of DC. -His SOB has resolved and he has no oxygen requirements. -He is 2.6 L negative since admission. -Compliance will be a big issue for him. -He refuses PT evals. He has also refused HH therapies. -Wonder how complaint he will be with medications and diet? -Cards has put him on PO lasix and he has been deemed stable for DC home.  CKD Stage IV -Stable. -Needs to be hooked up with renal as an OP.  Rest of chronic medical issues have been stable this hospitalization.   Time spent on Discharge: Greater than 30 minutes.  SignedChaya Jan [facility] Pager: [phone] 03/08/2013, 2:10 PM

## 2013-03-08 NOTE — Progress Notes (Signed)
  Echocardiogram 2D Echocardiogram has been performed.  Ellender Hose A 03/08/2013, 3:15 PM

## 2013-03-08 NOTE — Plan of Care (Signed)
Problem: Not Ready for Diet/Lifestyle Change (NB-1.3) Goal: Nutrition education Formal process to instruct or train a patient/client in a skill or to impart knowledge to help patients/clients voluntarily manage or modify food choices and eating behavior to maintain or improve health.  Attempted diet education with pt. Pt refused education and handouts.   Ebbie Latus RD, LDN

## 2013-03-09 NOTE — ED Provider Notes (Signed)
Medical screening examination/treatment/procedure(s) were performed by non-physician practitioner and as supervising physician I was immediately available for consultation/collaboration.   Berry Godsey M Hal Norrington, DO 03/09/13 1159 

## 2013-03-10 LAB — URINE CULTURE: Special Requests: NORMAL

## 2013-03-11 NOTE — Progress Notes (Signed)
CSW referred to assist this patient with ALF or SNF placement. RNCM- Ivonne Andrew spoke with patient and he adamantly refused to consider any type of placement and also refuses home health. No CSW needs identified. CSW signing off.  Lorri Frederick. [place]  [phone]

## 2013-03-12 ENCOUNTER — Other Ambulatory Visit: Payer: Self-pay | Admitting: Family Medicine

## 2013-03-12 ENCOUNTER — Encounter: Payer: Self-pay | Admitting: Family Medicine

## 2013-03-12 MED ORDER — AMOXICILLIN-POT CLAVULANATE 875-125 MG PO TABS
1.0000 | ORAL_TABLET | Freq: Two times a day (BID) | ORAL | Status: DC
Start: 2013-03-12 — End: 2013-03-21

## 2013-03-12 NOTE — Progress Notes (Signed)
Urinalysis in the hospital revealed enterococcus UTI. Culture shows sensitivity to ampicillin, nitrofurantoin, and vancomycin. Patient cannot use nitrofurantoin due to his chronic kidney disease.  Per the daughter he was discharged home without therapy for his UTI. Therefore I am calling out Augmentin 875 mg by mouth twice a day for 10 days and patient to come in tomorrow to be seen.  We are calling and leaving a voice message on his daughter's telephone to relay this information.  As of now we have not been able to reach the family.

## 2013-03-12 NOTE — Telephone Encounter (Signed)
This encounter was created in error - please disregard.

## 2013-03-13 ENCOUNTER — Telehealth: Payer: Self-pay | Admitting: Family Medicine

## 2013-03-13 NOTE — Telephone Encounter (Signed)
Called daughter to let her know that [name] needed to be seen but with MBD and totally refuses to see her.. I have done everything I could to conveince her that he needed to be seen..she refuses.

## 2013-03-13 NOTE — Telephone Encounter (Signed)
Patient ntbs and needs to be willing to help [place] help him.

## 2013-03-13 NOTE — Telephone Encounter (Signed)
Called dtr and she is adament not to see MBD.. Told her how important it was to have pt to come in states she will wait if she has to see MBD>

## 2013-03-13 NOTE — Telephone Encounter (Signed)
Daughter [name] does not want Mr. [name] to see MBD today states"never want to be on her schedule" she is aware he has to be seen today. Do you want to see him today or wait?

## 2013-03-21 ENCOUNTER — Emergency Department (HOSPITAL_COMMUNITY)
Admission: EM | Admit: 2013-03-21 | Discharge: 2013-03-22 | Disposition: A | Discharge status: Home / Self Care | Payer: Medicare Other | Attending: Emergency Medicine | Admitting: Emergency Medicine

## 2013-03-21 ENCOUNTER — Encounter: Payer: Self-pay | Admitting: Family Medicine

## 2013-03-21 ENCOUNTER — Ambulatory Visit (INDEPENDENT_AMBULATORY_CARE_PROVIDER_SITE_OTHER): Payer: Medicare Other | Admitting: Family Medicine

## 2013-03-21 ENCOUNTER — Emergency Department (HOSPITAL_COMMUNITY): Payer: Medicare Other

## 2013-03-21 ENCOUNTER — Encounter (HOSPITAL_COMMUNITY): Payer: Self-pay

## 2013-03-21 VITALS — BP 120/50 | HR 70 | Temp 98.3°F | Resp 18 | Wt 164.0 lb

## 2013-03-21 DIAGNOSIS — I2510 Atherosclerotic heart disease of native coronary artery without angina pectoris: Secondary | ICD-10-CM | POA: Insufficient documentation

## 2013-03-21 DIAGNOSIS — N289 Disorder of kidney and ureter, unspecified: Secondary | ICD-10-CM

## 2013-03-21 DIAGNOSIS — N184 Chronic kidney disease, stage 4 (severe): Secondary | ICD-10-CM

## 2013-03-21 DIAGNOSIS — Z8669 Personal history of other diseases of the nervous system and sense organs: Secondary | ICD-10-CM | POA: Insufficient documentation

## 2013-03-21 DIAGNOSIS — Z87828 Personal history of other (healed) physical injury and trauma: Secondary | ICD-10-CM | POA: Insufficient documentation

## 2013-03-21 DIAGNOSIS — I509 Heart failure, unspecified: Secondary | ICD-10-CM

## 2013-03-21 DIAGNOSIS — Z79899 Other long term (current) drug therapy: Secondary | ICD-10-CM | POA: Insufficient documentation

## 2013-03-21 DIAGNOSIS — Z862 Personal history of diseases of the blood and blood-forming organs and certain disorders involving the immune mechanism: Secondary | ICD-10-CM | POA: Insufficient documentation

## 2013-03-21 DIAGNOSIS — Z8709 Personal history of other diseases of the respiratory system: Secondary | ICD-10-CM | POA: Insufficient documentation

## 2013-03-21 DIAGNOSIS — M109 Gout, unspecified: Secondary | ICD-10-CM | POA: Insufficient documentation

## 2013-03-21 DIAGNOSIS — N390 Urinary tract infection, site not specified: Secondary | ICD-10-CM

## 2013-03-21 DIAGNOSIS — E119 Type 2 diabetes mellitus without complications: Secondary | ICD-10-CM

## 2013-03-21 DIAGNOSIS — Z8719 Personal history of other diseases of the digestive system: Secondary | ICD-10-CM | POA: Insufficient documentation

## 2013-03-21 DIAGNOSIS — M1289 Other specific arthropathies, not elsewhere classified, multiple sites: Secondary | ICD-10-CM | POA: Insufficient documentation

## 2013-03-21 DIAGNOSIS — C7951 Secondary malignant neoplasm of bone: Secondary | ICD-10-CM

## 2013-03-21 DIAGNOSIS — N189 Chronic kidney disease, unspecified: Secondary | ICD-10-CM | POA: Insufficient documentation

## 2013-03-21 DIAGNOSIS — I252 Old myocardial infarction: Secondary | ICD-10-CM | POA: Insufficient documentation

## 2013-03-21 DIAGNOSIS — N183 Chronic kidney disease, stage 3 unspecified: Secondary | ICD-10-CM | POA: Insufficient documentation

## 2013-03-21 DIAGNOSIS — E669 Obesity, unspecified: Secondary | ICD-10-CM | POA: Insufficient documentation

## 2013-03-21 DIAGNOSIS — C800 Disseminated malignant neoplasm, unspecified: Secondary | ICD-10-CM

## 2013-03-21 DIAGNOSIS — Z8701 Personal history of pneumonia (recurrent): Secondary | ICD-10-CM | POA: Insufficient documentation

## 2013-03-21 DIAGNOSIS — E785 Hyperlipidemia, unspecified: Secondary | ICD-10-CM | POA: Insufficient documentation

## 2013-03-21 DIAGNOSIS — M79609 Pain in unspecified limb: Secondary | ICD-10-CM | POA: Insufficient documentation

## 2013-03-21 DIAGNOSIS — C61 Malignant neoplasm of prostate: Secondary | ICD-10-CM | POA: Insufficient documentation

## 2013-03-21 DIAGNOSIS — Z87891 Personal history of nicotine dependence: Secondary | ICD-10-CM | POA: Insufficient documentation

## 2013-03-21 DIAGNOSIS — Z8679 Personal history of other diseases of the circulatory system: Secondary | ICD-10-CM | POA: Insufficient documentation

## 2013-03-21 DIAGNOSIS — I129 Hypertensive chronic kidney disease with stage 1 through stage 4 chronic kidney disease, or unspecified chronic kidney disease: Secondary | ICD-10-CM | POA: Insufficient documentation

## 2013-03-21 LAB — URINALYSIS, ROUTINE W REFLEX MICROSCOPIC
Glucose, UA: NEGATIVE mg/dL
pH: 6 (ref 5.0–8.0)

## 2013-03-21 LAB — URINALYSIS, MICROSCOPIC ONLY

## 2013-03-21 MED ORDER — OXYCODONE-ACETAMINOPHEN 5-325 MG PO TABS
1.0000 | ORAL_TABLET | Freq: Once | ORAL | Status: AC
  Administered 2013-03-21: 1 via ORAL
  Filled 2013-03-21: qty 1

## 2013-03-21 MED ORDER — DIAZEPAM 5 MG PO TABS
5.0000 mg | ORAL_TABLET | Freq: Once | ORAL | Status: AC
Start: 2013-03-21 — End: 2013-03-21
  Administered 2013-03-21: 5 mg via ORAL
  Filled 2013-03-21: qty 1

## 2013-03-21 MED ORDER — LOSARTAN POTASSIUM 50 MG PO TABS: 50.0000 mg | ORAL_TABLET | Freq: Every day | ORAL | Status: DC

## 2013-03-21 NOTE — ED Notes (Signed)
PA at bedside.

## 2013-03-21 NOTE — ED Notes (Signed)
Per EMS, pt went to primary MD today about his new back and upper leg bilateral pain.  Pt states he did not get anything from MD except a script for regular meds.  Pt states pain since around noon today.  No trauma noted.  Pt able to ambulate to stretcher.  Vitals:  148/82, hr 62, resp 16, pain 4

## 2013-03-21 NOTE — ED Notes (Signed)
Bed:WHALA<BR> Expected date:03/21/13<BR> Expected time: 9:09 PM<BR> Means of arrival:Ambulance<BR> Comments:<BR> 78 yo M back and leg pain

## 2013-03-21 NOTE — Addendum Note (Signed)
Addended by: Donne Anon on: 03/21/2013 05:04 PM   Modules accepted: Medications

## 2013-03-21 NOTE — ED Provider Notes (Signed)
History     CSN: [id number]  Arrival date & time 03/21/13  2126   First MD Initiated Contact with Patient 03/21/13 2133      Chief Complaint  Patient presents with  . Leg Pain  . Back Pain    (Consider location/radiation/quality/duration/timing/severity/associated sxs/prior treatment) Patient is a 78 y.o. male presenting with back pain. The history is provided by the patient and medical records. No language interpreter was used.  Back Pain Location:  Lumbar spine Quality:  Aching Radiates to:  L posterior upper leg and R posterior upper leg Pain severity:  Moderate Pain is:  Unable to specify Onset quality:  Gradual Timing:  Constant Progression:  Worsening Chronicity:  New Context: not falling, not jumping from heights, not lifting heavy objects, not MVA, not occupational injury, not pedestrian accident, not physical stress, not recent illness, not recent injury and not twisting   Relieved by:  Nothing Worsened by:  Nothing tried Ineffective treatments:  None tried Associated symptoms: leg pain   Associated symptoms: no abdominal pain, no abdominal swelling, no bladder incontinence, no bowel incontinence, no chest pain, no dysuria, no fever, no headaches, no numbness, no paresthesias, no pelvic pain, no perianal numbness, no tingling, no weakness and no weight loss   Risk factors: hx of cancer   Risk factors: no hx of osteoporosis, no lack of exercise, no menopause, not obese, no recent surgery, no steroid use and no vascular disease     Past Medical History  Diagnosis Date  . CAD (coronary artery disease)   . Hypertension   . Obesity   . Dyslipidemia   . Anemia   . H/O hemorrhoids   . Pneumonia   . H/O: GI bleed   . Diabetes mellitus   . Rhinitis, allergic   . Carpal tunnel syndrome   . Fatty liver   . Shoulder dislocation 1970    right  . Myocardial infarction 2007  . History of gout   . Asthma   . Exertional shortness of breath     "sometimes" (03/07/2013)   . Arthritis     "all over my whole body" (03/07/2013)  . Chronic kidney disease (CKD), stage III (moderate)     ckd III  . Prostate cancer     metastatic prostate cancer  . Bone metastasis     Past Surgical History  Procedure Laterality Date  . Transurethral resection of prostate  06/08/2011, 11/08/06  . Nerve repair Right 1974    "hand" (03/07/2013)    Family History  Problem Relation Age of Onset  . Diabetes Mother     History  Substance Use Topics  . Smoking status: Former Games developer  . Smokeless tobacco: Never Used     Comment: QUIT "YEARS" AGO  . Alcohol Use: No     Comment: former heavy use      Review of Systems  Constitutional: Negative for fever, weight loss and fatigue.  HENT: Negative for neck pain and neck stiffness.   Eyes: Negative for visual disturbance.  Respiratory: Negative for chest tightness and shortness of breath.   Cardiovascular: Negative for chest pain.  Gastrointestinal: Negative for nausea, vomiting, abdominal pain, diarrhea and bowel incontinence.  Endocrine: Negative for polyuria.  Genitourinary: Negative for bladder incontinence, dysuria, urgency, frequency, hematuria and pelvic pain.  Musculoskeletal: Positive for back pain. Negative for joint swelling and gait problem.  Skin: Negative for rash.  Allergic/Immunologic: Negative for immunocompromised state.  Neurological: Negative for tingling, weakness, light-headedness, numbness, headaches and paresthesias.  Hematological: Does not bruise/bleed easily.  Psychiatric/Behavioral: The patient is not nervous/anxious.   All other systems reviewed and are negative.    Allergies  Aspirin and Feldene  Home Medications   Current Outpatient Rx  Name  Route  Sig  Dispense  Refill  . allopurinol (ZYLOPRIM) 100 MG tablet   Oral   Take 200 mg by mouth daily.         Marland Kitchen amLODipine (NORVASC) 5 MG tablet   Oral   Take 1 tablet by mouth daily.         . bicalutamide (CASODEX) 50 MG tablet    Oral   Take 1 tablet by mouth daily.         . finasteride (PROSCAR) 5 MG tablet   Oral   Take 1 tablet (5 mg total) by mouth daily.   30 tablet   0   . furosemide (LASIX) 40 MG tablet   Oral   Take 1 tablet (40 mg total) by mouth 2 (two) times daily.   60 tablet   1   . hydrALAZINE (APRESOLINE) 25 MG tablet   Oral   Take 1 tablet by mouth 3 (three) times daily.         . isosorbide mononitrate (IMDUR) 60 MG 24 hr tablet   Oral   Take 1 tablet by mouth daily.         Marland Kitchen losartan (COZAAR) 50 MG tablet   Oral   Take 1 tablet (50 mg total) by mouth daily.   90 tablet   3   . metoprolol (LOPRESSOR) 50 MG tablet   Oral   Take 50 mg by mouth 2 (two) times daily.           . simvastatin (ZOCOR) 20 MG tablet   Oral   Take 20 mg by mouth at bedtime.           . sitaGLIPtin (JANUVIA) 25 MG tablet   Oral   Take 1 tablet (25 mg total) by mouth daily with breakfast.   30 tablet   0   . Tamsulosin HCl (FLOMAX) 0.4 MG CAPS   Oral   Take 0.4 mg by mouth daily.         . nitroGLYCERIN (NITROSTAT) 0.4 MG SL tablet   Sublingual   Place 0.4 mg under the tongue every 5 (five) minutes as needed.           Marland Kitchen oxyCODONE (OXY IR/ROXICODONE) 5 MG immediate release tablet   Oral   Take 1 tablet (5 mg total) by mouth every 6 (six) hours as needed.   45 tablet   0   . oxyCODONE-acetaminophen (PERCOCET) 5-325 MG per tablet   Oral   Take 1 tablet by mouth every 4 (four) hours as needed for pain.   20 tablet   0     BP 127/64  Pulse 81  Temp(Src) 98.5 F (36.9 C) (Oral)  Resp 16  SpO2 95%  Physical Exam  Nursing note and vitals reviewed. Constitutional: He is oriented to person, place, and time. He appears well-developed and well-nourished. No distress.  HENT:  Head: Normocephalic and atraumatic.  Mouth/Throat: Oropharynx is clear and moist. No oropharyngeal exudate.  Eyes: Conjunctivae are normal. Pupils are equal, round, and reactive to light.  Neck:  Normal range of motion. Neck supple.  Full ROM without pain  Cardiovascular: Normal rate, regular rhythm, normal heart sounds and intact distal pulses.   No murmur heard. Pulmonary/Chest: Effort normal  and breath sounds normal. No respiratory distress. He has no wheezes. He has no rales.  Abdominal: Soft. He exhibits no distension. There is no tenderness. There is no rebound.  Musculoskeletal: He exhibits tenderness. He exhibits no edema.       Lumbar back: He exhibits decreased range of motion (mild), tenderness and pain. He exhibits no bony tenderness, no swelling, no edema, no deformity, no laceration and no spasm.  Full range of motion of the T-spine and L-spine No tenderness to palpation of the spinous processes of the T-spine or L-spine Tenderness to palpation of the paraspinous muscles of the L-spine with reproducible radiation of pain into the posterior upper thigh's bilaterally  Lymphadenopathy:    He has no cervical adenopathy.  Neurological: He is alert and oriented to person, place, and time. He has normal reflexes. No cranial nerve deficit. He exhibits normal muscle tone. Coordination normal.  Speech is clear and goal oriented, follows commands Normal strength in upper and lower extremities bilaterally including dorsiflexion and plantar flexion, strong and equal grip strength Sensation normal to light and sharp touch Moves extremities without ataxia, coordination intact Normal gait Normal balance   Skin: Skin is warm and dry. No rash noted. He is not diaphoretic. No erythema.  Psychiatric: He has a normal mood and affect.    ED Course  Procedures (including critical care time)  Labs Reviewed  GLUCOSE, CAPILLARY - Abnormal; Notable for the following:    Glucose-Capillary 117 (*)    All other components within normal limits  URINALYSIS, ROUTINE W REFLEX MICROSCOPIC   Dg Lumbar Spine Complete  03/21/2013  *RADIOLOGY REPORT*  Clinical Data: Back pain  LUMBAR SPINE -  COMPLETE 4+ VIEW  Comparison: 02/03/2013  Findings: Multifocal sclerotic foci throughout the visualized bone. Increased L2 superior endplate sclerosis and irregularity, may reflect mild compression deformity.  No retropulsion. Atherosclerotic vascular calcifications.  Minimal anterolisthesis of L4 on L5.  IMPRESSION: Sclerotic foci scattered throughout the visualized bones, in keeping with metastatic disease.  Sclerosis/mild irregularity at the L2 vertebral body superior endplate may reflect mild compression deformity.  No retropulsion.   Original Report Authenticated By: Jearld Lesch, M.D.      1. Metastatic prostate cancer to bone.    2. Diabetes mellitus   3. Chronic renal insufficiency       MDM  Helayne Seminole presents with nontraumatic back pain.  Pt is elderly, with Hx of prostate cancer with mets and permanent foley cath.  Therefore my ddx includes concerns for UTI vs mechanical strain vs compression fracture vs further mets.  Pt is without neurologic symptoms on exam.  Patient can walk without difficulty.  No loss of bowel control.  No concern for cauda equina 2/2 x-ray.  Pt with possible compression fracture on x-ray vs changing met.  I discussed the case with Dr. Karmen Bongo who does not believe a CT scan will give more information or change our treatment plan as there is no evidence of retropulsion.  I personally reviewed the imaging tests through PACS system.  I reviewed available ER/hospitalization records through the EMR.  No fever, night sweats, weight loss, IVDU.  RICE protocol and pain medicine indicated and discussed with patient. Pt is to follow-up with oncology for progressive management of mets.    UA pending Santina Evans, PA-C will follow and dispo as needed.              Dahlia Client Lalena Salas, PA-C 03/22/13 0147

## 2013-03-21 NOTE — Patient Instructions (Addendum)
Stop amlodipine Replace with losartan 50 mg once a day. Recheck your labs here in 1 week.

## 2013-03-21 NOTE — ED Notes (Signed)
CBG 117 

## 2013-03-21 NOTE — Progress Notes (Signed)
Pt brought med's form home in for [place] to review but due to office being busy could not review or confirm before patient insisted on leaving.  Hopefully he will return with them in the am so we can be sure he is taking correct medications

## 2013-03-21 NOTE — Progress Notes (Signed)
Subjective:    Patient ID: [name], male    DOB: 03/13/1935, 78 y.o.   MRN: [id number]  HPI [name] is here today for hospital followup. He was recently admitted with pulmonary edema and exacerbation of congestive heart failure. An echocardiogram in the hospital revealed an ejection fraction of 25%.  The patient's past medical history is a complicating factor. He has stage IV chronic kidney disease this probably due to uncontrolled diabetes and hypertension and partially due to obstructive uropathy from metastatic prostate cancer.  He has an indwelling Foley catheter to alleviate the obstruction.  His medical followup and compliance has been poor. He does not take medicines as he is instructed to. He has not followed up as he is asked to. This is made it impossible to control his diabetes.  At the hospital he was diuresed approximately 3 L. He is doing well now. He is satting 99% on room air. There is no peripheral edema on his exam. His lungs are clear to auscultation bilaterally. However he complains of weight loss, malaise, fatigue, and generalized weakness. Had a long discussion that this is most likely due to his metastatic prostate cancer coupled with his severe CHF.   Past Medical History  Diagnosis Date  . CAD (coronary artery disease)   . Hypertension   . Obesity   . Dyslipidemia   . Anemia   . H/O hemorrhoids   . Pneumonia   . H/O: GI bleed   . Diabetes mellitus   . Rhinitis, allergic   . Carpal tunnel syndrome   . Fatty liver   . Shoulder dislocation 1970    right  . Myocardial infarction 2007  . History of gout   . Asthma   . Exertional shortness of breath     "sometimes" (03/07/2013)  . Arthritis     "all over my whole body" (03/07/2013)  . Chronic kidney disease (CKD), stage III (moderate)     ckd III  . Prostate cancer     metastatic prostate cancer  . Bone metastasis    Current Outpatient Prescriptions on File Prior to Visit  Medication Sig Dispense Refill   . allopurinol (ZYLOPRIM) 100 MG tablet Take 200 mg by mouth daily.      . finasteride (PROSCAR) 5 MG tablet Take 1 tablet (5 mg total) by mouth daily.  30 tablet  0  . metoprolol (LOPRESSOR) 50 MG tablet Take 50 mg by mouth 2 (two) times daily.        . nitroGLYCERIN (NITROSTAT) 0.4 MG SL tablet Place 0.4 mg under the tongue every 5 (five) minutes as needed.        . simvastatin (ZOCOR) 20 MG tablet Take 20 mg by mouth at bedtime.        . sitaGLIPtin (JANUVIA) 25 MG tablet Take 1 tablet (25 mg total) by mouth daily with breakfast.  30 tablet  0  . furosemide (LASIX) 40 MG tablet Take 1 tablet (40 mg total) by mouth 2 (two) times daily.  60 tablet  1  . oxyCODONE (OXY IR/ROXICODONE) 5 MG immediate release tablet Take 1 tablet (5 mg total) by mouth every 6 (six) hours as needed.  45 tablet  0  . Tamsulosin HCl (FLOMAX) 0.4 MG CAPS Take 0.4 mg by mouth daily.       No current facility-administered medications on file prior to visit.   Allergies  Allergen Reactions  . Aspirin Other (See Comments)    "makes my stomach hurt real  bad."  . Feldene (Piroxicam) Swelling       Review of Systems  All other systems reviewed and are negative.       Objective:   Physical Exam  HENT:  Head: Normocephalic and atraumatic.  Neck: Neck supple. No JVD present. No thyromegaly present.  Cardiovascular: Normal rate and regular rhythm.  Exam reveals gallop (occasional PVC's).   No murmur heard. Pulmonary/Chest: Effort normal and breath sounds normal. He has no wheezes. He has no rales.  Abdominal: Soft. Bowel sounds are normal.  Lymphadenopathy:    He has no cervical adenopathy.   he has an indwelling Foley catheter. There is no peripheral edema on exam        Assessment & Plan:  1. CHF (congestive heart failure) Patient is currently taking a beta blocker as well as Lasix. He would benefit from being on an ARB and spironolactone. However given his difficult followup, noncompliance, and  chronic kidney disease I am hesitant to start these medications at present. I will begin with losartan 50 mg by mouth daily. I advised the patient come back in one week for a repeat BMP. This will allow me to assess his potassium. He is also to discontinue the amlodipine as we are starting a new blood pressure medicine. - Basic Metabolic Panel - CBC with Differential - Hemoglobin A1c - Hepatic Function Panel - Pro b natriuretic peptide - losartan (COZAAR) 50 MG tablet; Take 1 tablet (50 mg total) by mouth daily.  Dispense: 90 tablet; Refill: 3 - Urine culture  2. Chronic kidney disease (CKD), stage IV (severe) Beginning losartan 50 mg by mouth daily. Reassess his creatinine in one week. - Basic Metabolic Panel - CBC with Differential - Hemoglobin A1c - Hepatic Function Panel - Pro b natriuretic peptide - Urinalysis, Routine w reflex microscopic - losartan (COZAAR) 50 MG tablet; Take 1 tablet (50 mg total) by mouth daily.  Dispense: 90 tablet; Refill: 3 - Urine culture  3. Prostate cancer metastatic to multiple sites Recheck urinalysis today. Urine culture the hospital revealed an enterococcus UTI.  He has an indwelling Foley catheter that is likely contaminated. This may be colonization. I will recheck a urine culture.  Urinalysis, Routine w reflex microscopic - Urine culture  4. Type II or unspecified type diabetes mellitus without mention of complication, uncontrolled This is been poorly controlled due to his unwillingness to start insulin. I will recheck a hemoglobin A1c and discuss with him further options at his next office visit in one week. - Basic Metabolic Panel - CBC with Differential - Hemoglobin A1c - losartan (COZAAR) 50 MG tablet; Take 1 tablet (50 mg total) by mouth daily.  Dispense: 90 tablet; Refill: 3 - Urine culture  5. Urinary tract infection, site not specified See problem #3 - Urine culture

## 2013-03-22 LAB — HEPATIC FUNCTION PANEL
ALT: 8 U/L (ref 0–53)
Bilirubin, Direct: 0.2 mg/dL (ref 0.0–0.3)
Indirect Bilirubin: 0.5 mg/dL (ref 0.0–0.9)

## 2013-03-22 LAB — URINALYSIS, ROUTINE W REFLEX MICROSCOPIC
Glucose, UA: NEGATIVE mg/dL
Ketones, ur: NEGATIVE mg/dL
Nitrite: POSITIVE — AB
Specific Gravity, Urine: 1.022 (ref 1.005–1.030)
pH: 6 (ref 5.0–8.0)

## 2013-03-22 LAB — BASIC METABOLIC PANEL
Chloride: 110 mEq/L (ref 96–112)
Creat: 2.79 mg/dL — ABNORMAL HIGH (ref 0.50–1.35)
Potassium: 5.6 mEq/L — ABNORMAL HIGH (ref 3.5–5.3)
Sodium: 138 mEq/L (ref 135–145)

## 2013-03-22 LAB — CBC WITH DIFFERENTIAL/PLATELET
Eosinophils Absolute: 0.3 10*3/uL (ref 0.0–0.7)
HCT: 28.4 % — ABNORMAL LOW (ref 39.0–52.0)
Hemoglobin: 9 g/dL — CL (ref 13.0–17.0)
Lymphs Abs: 1 10*3/uL (ref 0.7–4.0)
MCH: 25.8 pg — ABNORMAL LOW (ref 26.0–34.0)
MCV: 81.4 fL (ref 78.0–100.0)
Monocytes Relative: 8 % (ref 3–12)
Neutrophils Relative %: 64 % (ref 43–77)
RBC: 3.49 MIL/uL — ABNORMAL LOW (ref 4.22–5.81)

## 2013-03-22 LAB — HEMOGLOBIN A1C: Hgb A1c MFr Bld: 6.8 % — ABNORMAL HIGH (ref <5.7)

## 2013-03-22 LAB — URINE MICROSCOPIC-ADD ON

## 2013-03-22 MED ORDER — OXYCODONE-ACETAMINOPHEN 5-325 MG PO TABS: 1.0000 | ORAL_TABLET | ORAL | Status: DC | PRN

## 2013-03-22 MED ORDER — CIPROFLOXACIN HCL 500 MG PO TABS: 500.0000 mg | ORAL_TABLET | Freq: Two times a day (BID) | ORAL | Status: DC

## 2013-03-22 NOTE — ED Provider Notes (Signed)
Pt received from Muthersbaugh, PA-C.  Pt has a UTI.  Results discussed w/ pt.  Prescribed cipro pending culture.  2:13 AM   Otilio Miu, PA-C 03/22/13 [phone]

## 2013-03-22 NOTE — ED Notes (Signed)
Pt calling daughter for ride home.  Is going to wait in lobby.  States he has the number.

## 2013-03-24 LAB — URINE CULTURE
Colony Count: >=100000
Colony Count: >=100000

## 2013-03-25 NOTE — ED Notes (Signed)
+   urine Patient treated with Cipro-sensitive to same-chart appended per protocol MD. 

## 2013-03-26 ENCOUNTER — Encounter: Payer: Self-pay | Admitting: Radiation Oncology

## 2013-03-26 NOTE — ED Provider Notes (Signed)
Medical screening examination/treatment/procedure(s) were performed by non-physician practitioner and as supervising physician I was immediately available for consultation/collaboration.   Loren Racer, MD 03/26/13 [phone]

## 2013-03-26 NOTE — ED Provider Notes (Signed)
Medical screening examination/treatment/procedure(s) were performed by non-physician practitioner and as supervising physician I was immediately available for consultation/collaboration.   Vick Filter, MD 03/26/13 [id number] 

## 2013-03-28 ENCOUNTER — Other Ambulatory Visit (INDEPENDENT_AMBULATORY_CARE_PROVIDER_SITE_OTHER): Payer: Medicare Other

## 2013-03-28 ENCOUNTER — Ambulatory Visit: Payer: Medicare Other | Admitting: Radiation Oncology

## 2013-03-28 ENCOUNTER — Encounter: Payer: Medicare Other | Admitting: Family Medicine

## 2013-03-28 ENCOUNTER — Telehealth: Payer: Self-pay

## 2013-03-28 DIAGNOSIS — Z0000 Encounter for general adult medical examination without abnormal findings: Secondary | ICD-10-CM

## 2013-03-28 NOTE — Telephone Encounter (Signed)
Patient had transportation issues, so unable to come for radiation follow up.Daughter Adela Lank to call back to reschedule appointment.Patient has cell phone but she doesn't know number and is currently driving.

## 2013-03-28 NOTE — Progress Notes (Signed)
  Subjective:    Patient ID: [name], male    DOB: August 11, 1935, 78 y.o.   MRN: [id number]  HPI Patient missed appointment but arrived 2:30 late.  I will reschedule him, but check BMP today to monitor K closely.   Review of Systems     Objective:   Physical Exam        Assessment & Plan:   This encounter was created in error - please disregard.

## 2013-03-29 LAB — BASIC METABOLIC PANEL
Calcium: 9.1 mg/dL (ref 8.4–10.5)
Sodium: 134 mEq/L — ABNORMAL LOW (ref 135–145)

## 2013-04-05 ENCOUNTER — Encounter: Payer: Self-pay | Admitting: Family Medicine

## 2013-04-05 ENCOUNTER — Ambulatory Visit (INDEPENDENT_AMBULATORY_CARE_PROVIDER_SITE_OTHER): Payer: Medicare Other | Admitting: Family Medicine

## 2013-04-05 ENCOUNTER — Other Ambulatory Visit: Payer: Self-pay | Admitting: Family Medicine

## 2013-04-05 VITALS — BP 130/64 | HR 68 | Temp 97.9°F | Resp 14 | Wt 161.0 lb

## 2013-04-05 DIAGNOSIS — I509 Heart failure, unspecified: Secondary | ICD-10-CM

## 2013-04-05 DIAGNOSIS — N390 Urinary tract infection, site not specified: Secondary | ICD-10-CM

## 2013-04-05 DIAGNOSIS — N184 Chronic kidney disease, stage 4 (severe): Secondary | ICD-10-CM

## 2013-04-05 LAB — URINALYSIS, ROUTINE W REFLEX MICROSCOPIC
Glucose, UA: NEGATIVE mg/dL
Protein, ur: >300 mg/dL — AB

## 2013-04-05 LAB — CBC WITH DIFFERENTIAL/PLATELET
Basophils Relative: 1 % (ref 0–1)
Eosinophils Relative: 6 % — ABNORMAL HIGH (ref 0–5)
HCT: 28.5 % — ABNORMAL LOW (ref 39.0–52.0)
Hemoglobin: 9 g/dL — CL (ref 13.0–17.0)
MCH: 25.4 pg — ABNORMAL LOW (ref 26.0–34.0)
MCHC: 31.6 g/dL (ref 30.0–36.0)
MCV: 80.5 fL (ref 78.0–100.0)
Monocytes Absolute: 0.3 10*3/uL (ref 0.1–1.0)
Monocytes Relative: 5 % (ref 3–12)
Neutro Abs: 4 10*3/uL (ref 1.7–7.7)

## 2013-04-05 LAB — URINALYSIS, MICROSCOPIC ONLY: Crystals: NONE SEEN

## 2013-04-05 LAB — BASIC METABOLIC PANEL
CO2: 13 mEq/L — ABNORMAL LOW (ref 19–32)
Calcium: 9.4 mg/dL (ref 8.4–10.5)
Chloride: 111 mEq/L (ref 96–112)
Potassium: 5.8 mEq/L — ABNORMAL HIGH (ref 3.5–5.3)
Sodium: 136 mEq/L (ref 135–145)

## 2013-04-05 MED ORDER — CIPROFLOXACIN HCL 250 MG PO TABS: 250.0000 mg | ORAL_TABLET | Freq: Two times a day (BID) | ORAL | Status: DC

## 2013-04-05 NOTE — Telephone Encounter (Signed)
Rx Refilled  

## 2013-04-05 NOTE — Progress Notes (Signed)
Subjective:    Patient ID: [name], male    DOB: 02/28/1935, 78 y.o.   MRN: [id number]  HPI 03/21/13 [name] is here today for hospital followup. He was recently admitted with pulmonary edema and exacerbation of congestive heart failure. An echocardiogram in the hospital revealed an ejection fraction of 25%.  The patient's past medical history is a complicating factor. He has stage IV chronic kidney disease this probably due to uncontrolled diabetes and hypertension and partially due to obstructive uropathy from metastatic prostate cancer.  He has an indwelling Foley catheter to alleviate the obstruction.  His medical followup and compliance has been poor. He does not take medicines as he is instructed to. He has not followed up as he is asked to. This is made it impossible to control his diabetes.  At the hospital he was diuresed approximately 3 L. He is doing well now. He is satting 99% on room air. There is no peripheral edema on his exam. His lungs are clear to auscultation bilaterally. However he complains of weight loss, malaise, fatigue, and generalized weakness. Had a long discussion that this is most likely due to his metastatic prostate cancer coupled with his severe CHF.   Therefore: Assessment & Plan:  1. CHF (congestive heart failure) Patient is currently taking a beta blocker as well as Lasix. He would benefit from being on an ARB and spironolactone. However given his difficult followup, noncompliance, and chronic kidney disease I am hesitant to start these medications at present. I will begin with losartan 50 mg by mouth daily. I advised the patient come back in one week for a repeat BMP. This will allow me to assess his potassium. He is also to discontinue the amlodipine as we are starting a new blood pressure medicine. - Basic Metabolic Panel - CBC with Differential - Hemoglobin A1c - Hepatic Function Panel - Pro b natriuretic peptide - losartan (COZAAR) 50 MG tablet; Take 1  tablet (50 mg total) by mouth daily.  Dispense: 90 tablet; Refill: 3 - Urine culture  2. Chronic kidney disease (CKD), stage IV (severe) Beginning losartan 50 mg by mouth daily. Reassess his creatinine in one week. - Basic Metabolic Panel - CBC with Differential - Hemoglobin A1c - Hepatic Function Panel - Pro b natriuretic peptide - Urinalysis, Routine w reflex microscopic - losartan (COZAAR) 50 MG tablet; Take 1 tablet (50 mg total) by mouth daily.  Dispense: 90 tablet; Refill: 3 - Urine culture  3. Prostate cancer metastatic to multiple sites Recheck urinalysis today. Urine culture the hospital revealed an enterococcus UTI.  He has an indwelling Foley catheter that is likely contaminated. This may be colonization. I will recheck a urine culture.  Urinalysis, Routine w reflex microscopic - Urine culture  4. Type II or unspecified type diabetes mellitus without mention of complication, uncontrolled This is been poorly controlled due to his unwillingness to start insulin. I will recheck a hemoglobin A1c and discuss with him further options at his next office visit in one week. - Basic Metabolic Panel - CBC with Differential - Hemoglobin A1c - losartan (COZAAR) 50 MG tablet; Take 1 tablet (50 mg total) by mouth daily.  Dispense: 90 tablet; Refill: 3 - Urine culture  5. Urinary tract infection, site not specified See problem #3 - Urine culture  04/05/13 Patient returns today for followup.  Below are the labs in the last 3 weeks. His potassium has been stable even on the addition of [name] and. However unfortunately his urine  culture has revealed an infection of staph aureus on 2 separate occasions. He was reportedly given Cipro in the emergency room on April 25. However the patient is not sure he took that medication. He does complain of some pelvic discomfort. His catheter is still in place. He is scheduled to see urology to have the catheter exchanged on the 14th. He denies fevers and  chills. He continues to have weight loss and anorexia. Every does have metastatic prostate cancer. Fortunately his hemoglobin A1c came back better than ever and 6.8. Therefore his diabetic medications and seemed to be working. Office Visit on 03/28/2013  Component Date Value Range Status  . Sodium 03/28/2013 134* 135 - 145 mEq/L Final  . Potassium 03/28/2013 4.6  3.5 - 5.3 mEq/L Final  . Chloride 03/28/2013 107  96 - 112 mEq/L Final  . CO2 03/28/2013 16* 19 - 32 mEq/L Final  . Glucose, Bld 03/28/2013 178* 70 - 99 mg/dL Final  . BUN 13/06/6577 28* 6 - 23 mg/dL Final  . Creat 46/96/2952 2.92* 0.50 - 1.35 mg/dL Final  . Calcium 84/13/2440 9.1  8.4 - 10.5 mg/dL Final  Admission on 09/24/2535, Discharged on 03/22/2013  Component Date Value Range Status  . Color, Urine 03/22/2013 RED* YELLOW Final   BIOCHEMICALS MAY BE AFFECTED BY COLOR  . APPearance 03/22/2013 TURBID* CLEAR Final  . Specific Gravity, Urine 03/22/2013 1.022  1.005 - 1.030 Final  . pH 03/22/2013 6.0  5.0 - 8.0 Final  . Glucose, UA 03/22/2013 NEGATIVE  NEGATIVE mg/dL Final  . Hgb urine dipstick 03/22/2013 LARGE* NEGATIVE Final  . Bilirubin Urine 03/22/2013 SMALL* NEGATIVE Final  . Ketones, ur 03/22/2013 NEGATIVE  NEGATIVE mg/dL Final  . Protein, ur 64/40/3474 >300* NEGATIVE mg/dL Final  . Urobilinogen, UA 03/22/2013 1.0  0.0 - 1.0 mg/dL Final  . Nitrite 25/95/6387 POSITIVE* NEGATIVE Final  . Leukocytes, UA 03/22/2013 LARGE* NEGATIVE Final  . Glucose-Capillary 03/21/2013 117* 70 - 99 mg/dL Final  . Squamous Epithelial / LPF 03/22/2013 RARE  RARE Final  . WBC, UA 03/22/2013 TOO NUMEROUS TO COUNT  <3 WBC/hpf Final  . RBC / HPF 03/22/2013 TOO NUMEROUS TO COUNT  <3 RBC/hpf Final  . Bacteria, UA 03/22/2013 FEW* RARE Final  . Casts 03/22/2013 HYALINE CASTS* NEGATIVE Final  . Specimen Description 03/22/2013 URINE, CATHETERIZED   Final  . Special Requests 03/22/2013 NONE   Final  . Culture  Setup Time 03/22/2013 03/22/2013 08:48    Final  . Colony Count 03/22/2013 >=100,000 COLONIES/ML   Final  . Culture 03/22/2013    Final                   Value:STAPHYLOCOCCUS AUREUS                         Note: RIFAMPIN AND GENTAMICIN SHOULD NOT BE USED AS SINGLE DRUGS FOR TREATMENT OF STAPH INFECTIONS.  Marland Kitchen Report Status 03/22/2013 03/24/2013 FINAL   Final  . Organism ID, Bacteria 03/22/2013 STAPHYLOCOCCUS AUREUS   Final  Office Visit on 03/21/2013  Component Date Value Range Status  . Sodium 03/21/2013 138  135 - 145 mEq/L Final  . Potassium 03/21/2013 5.6* 3.5 - 5.3 mEq/L Final   No visible hemolysis.  . Chloride 03/21/2013 110  96 - 112 mEq/L Final  . CO2 03/21/2013 21  19 - 32 mEq/L Final  . Glucose, Bld 03/21/2013 151* 70 - 99 mg/dL Final  . BUN 56/43/3295 26* 6 - 23 mg/dL  Final  . Creat 03/21/2013 2.79* 0.50 - 1.35 mg/dL Final  . Calcium 16/08/9603 9.0  8.4 - 10.5 mg/dL Final  . WBC 54/07/8118 4.9  4.0 - 10.5 K/uL Final  . RBC 03/21/2013 3.49* 4.22 - 5.81 MIL/uL Final  . Hemoglobin 03/21/2013 9.0* 13.0 - 17.0 g/dL Final  . HCT 14/78/2956 28.4* 39.0 - 52.0 % Final  . MCV 03/21/2013 81.4  78.0 - 100.0 fL Final  . MCH 03/21/2013 25.8* 26.0 - 34.0 pg Final  . MCHC 03/21/2013 31.7  30.0 - 36.0 g/dL Final  . RDW 21/30/8657 19.6* 11.5 - 15.5 % Final  . Platelets 03/21/2013 286  150 - 400 K/uL Final  . Neutrophils Relative 03/21/2013 64  43 - 77 % Final  . Neutro Abs 03/21/2013 3.2  1.7 - 7.7 K/uL Final  . Lymphocytes Relative 03/21/2013 21  12 - 46 % Final  . Lymphs Abs 03/21/2013 1.0  0.7 - 4.0 K/uL Final  . Monocytes Relative 03/21/2013 8  3 - 12 % Final  . Monocytes Absolute 03/21/2013 0.4  0.1 - 1.0 K/uL Final  . Eosinophils Relative 03/21/2013 6* 0 - 5 % Final  . Eosinophils Absolute 03/21/2013 0.3  0.0 - 0.7 K/uL Final  . Basophils Relative 03/21/2013 1  0 - 1 % Final  . Basophils Absolute 03/21/2013 0.0  0.0 - 0.1 K/uL Final  . Smear Review 03/21/2013 Criteria for review not met   Final  . Hemoglobin A1C  03/21/2013 6.8* <5.7 % Final   Comment:                                                                                                 According to the ADA Clinical Practice Recommendations for 2011, when                          HbA1c is used as a screening test:                                                       >=6.5%   Diagnostic of Diabetes Mellitus                                     (if abnormal result is confirmed)                                                     5.7-6.4%   Increased risk of developing Diabetes Mellitus  References:Diagnosis and Classification of Diabetes Mellitus,Diabetes                          Care,2011,34(Suppl 1):S62-S69 and Standards of Medical Care in                                  Diabetes - 2011,Diabetes Care,2011,34 (Suppl 1):S11-S61.                             . Mean Plasma Glucose 03/21/2013 148* <117 mg/dL Final  . Total Bilirubin 03/21/2013 0.7  0.3 - 1.2 mg/dL Final  . Bilirubin, Direct 03/21/2013 0.2  0.0 - 0.3 mg/dL Final  . Indirect Bilirubin 03/21/2013 0.5  0.0 - 0.9 mg/dL Final  . Alkaline Phosphatase 03/21/2013 197* 39 - 117 U/L Final  . AST 03/21/2013 11  0 - 37 U/L Final  . ALT 03/21/2013 8  0 - 53 U/L Final  . Total Protein 03/21/2013 6.1  6.0 - 8.3 g/dL Final  . Albumin 16/08/9603 3.2* 3.5 - 5.2 g/dL Final  . Pro B Natriuretic peptide (BNP) 03/21/2013 22776.00* <126 pg/mL Final  . Color, Urine 03/21/2013 BROWN* YELLOW Final   Biochemicals may be affected by the color of the urine.  Marland Kitchen APPearance 03/21/2013 TURBID* CLEAR Final  . Specific Gravity, Urine 03/21/2013 1.020  1.005 - 1.030 Final  . pH 03/21/2013 6.0  5.0 - 8.0 Final  . Glucose, UA 03/21/2013 NEG  NEG mg/dL Final  . Bilirubin Urine 03/21/2013 SMALL* NEG Final  . Ketones, ur 03/21/2013 NEG  NEG mg/dL Final  . Hgb urine dipstick 03/21/2013 LARGE* NEG Final  . Protein, ur 03/21/2013 > 300* NEG mg/dL Final  .  Urobilinogen, UA 03/21/2013 0.2  0.0 - 1.0 mg/dL Final  . Nitrite 54/07/8118 POS* NEG Final  . Leukocytes, UA 03/21/2013 SMALL* NEG Final  . Culture 03/21/2013 STAPHYLOCOCCUS AUREUS   Final  . Colony Count 03/21/2013 >=100,000 COLONIES/ML   Final  . Organism ID, Bacteria 03/21/2013 STAPHYLOCOCCUS AUREUS   Final   Comment: Rifampin and Gentamicin should not be used as                          single drugs for treatment of Staph infections.  . Squamous Epithelial / LPF 03/21/2013 RARE  RARE Final  . Crystals 03/21/2013 NONE SEEN  NONE SEEN Final  . Casts 03/21/2013 NONE SEEN  NONE SEEN Final  . WBC, UA 03/21/2013 TNTC* <3 WBC/hpf Final  . RBC / HPF 03/21/2013 TNTC* <3 RBC/hpf Final  . Bacteria, UA 03/21/2013 MANY* RARE Final   Comment: TOO TURBID TO SPIN                          SPECIMEN FROM CATH BAG    Past Medical History  Diagnosis Date  . CAD (coronary artery disease)   . Hypertension   . Obesity   . Dyslipidemia   . Anemia   . H/O hemorrhoids   . Pneumonia   . H/O: GI bleed   . Diabetes mellitus   . Rhinitis, allergic   . Carpal tunnel syndrome   . Fatty liver   . Shoulder dislocation 1970    right  . Myocardial infarction 2007  . History of gout   . Asthma   .  Exertional shortness of breath     "sometimes" (03/07/2013)  . Arthritis     "all over my whole body" (03/07/2013)  . Chronic kidney disease (CKD), stage III (moderate)     ckd III  . Prostate cancer 02/11/13-02/20/13    metastatic prostate cancer  . Bone metastasis    Current Outpatient Prescriptions on File Prior to Visit  Medication Sig Dispense Refill  . allopurinol (ZYLOPRIM) 100 MG tablet Take 200 mg by mouth daily.      Marland Kitchen amLODipine (NORVASC) 5 MG tablet Take 1 tablet by mouth daily.      . bicalutamide (CASODEX) 50 MG tablet Take 1 tablet by mouth daily.      . ciprofloxacin (CIPRO) 500 MG tablet Take 1 tablet (500 mg total) by mouth 2 (two) times daily.  14 tablet  0  . finasteride (PROSCAR) 5 MG  tablet Take 1 tablet (5 mg total) by mouth daily.  30 tablet  0  . furosemide (LASIX) 40 MG tablet Take 1 tablet (40 mg total) by mouth 2 (two) times daily.  60 tablet  1  . hydrALAZINE (APRESOLINE) 25 MG tablet Take 1 tablet by mouth 3 (three) times daily.      . isosorbide mononitrate (IMDUR) 60 MG 24 hr tablet Take 1 tablet by mouth daily.      Marland Kitchen losartan (COZAAR) 50 MG tablet Take 1 tablet (50 mg total) by mouth daily.  90 tablet  3  . metoprolol (LOPRESSOR) 50 MG tablet Take 50 mg by mouth 2 (two) times daily.        . nitroGLYCERIN (NITROSTAT) 0.4 MG SL tablet Place 0.4 mg under the tongue every 5 (five) minutes as needed.        Marland Kitchen oxyCODONE (OXY IR/ROXICODONE) 5 MG immediate release tablet Take 1 tablet (5 mg total) by mouth every 6 (six) hours as needed.  45 tablet  0  . oxyCODONE-acetaminophen (PERCOCET) 5-325 MG per tablet Take 1 tablet by mouth every 4 (four) hours as needed for pain.  20 tablet  0  . simvastatin (ZOCOR) 20 MG tablet Take 20 mg by mouth at bedtime.        . sitaGLIPtin (JANUVIA) 25 MG tablet Take 1 tablet (25 mg total) by mouth daily with breakfast.  30 tablet  0  . Tamsulosin HCl (FLOMAX) 0.4 MG CAPS Take 0.4 mg by mouth daily.       No current facility-administered medications on file prior to visit.   Allergies  Allergen Reactions  . Aspirin Other (See Comments)    "makes my stomach hurt real bad."  . Feldene (Piroxicam) Swelling       Review of Systems  All other systems reviewed and are negative.       Objective:   Physical Exam  HENT:  Head: Normocephalic and atraumatic.  Neck: Neck supple. No JVD present. No thyromegaly present.  Cardiovascular: Normal rate and regular rhythm.  Exam reveals gallop (occasional PVC's).   No murmur heard. Pulmonary/Chest: Effort normal and breath sounds normal. He has no wheezes. He has no rales.  Abdominal: Soft. Bowel sounds are normal.  Lymphadenopathy:    He has no cervical adenopathy.   he has an  indwelling Foley catheter. There is no peripheral edema on exam        Assessment & Plan:  1. CHF (congestive heart failure) Continue losartan.  We will try to maximize medical therapy. The patient's potassium remained stable, and his compliance improves he may  benefit from spironolactone. - Basic Metabolic Panel  2. Chronic kidney disease (CKD), stage IV (severe) Monitor potassium closely on current medications. - Basic Metabolic Panel  3. Urinary tract infection, site not specified His catheter was likely contaminated. However some of his symptoms could be related to a chronic urinary tract infection. A repeat urinalysis and urine culture today. If positive I will place him on antibiotic and leave him on an antibiotic and a urology changes out the Foley catheter. - CBC with Differential - Urinalysis, Routine w reflex microscopic - Urine culture

## 2013-04-07 ENCOUNTER — Encounter (HOSPITAL_COMMUNITY): Payer: Self-pay

## 2013-04-07 ENCOUNTER — Emergency Department (HOSPITAL_COMMUNITY): Payer: Medicare Other

## 2013-04-07 ENCOUNTER — Inpatient Hospital Stay (HOSPITAL_COMMUNITY)
Admission: EM | Admit: 2013-04-07 | Discharge: 2013-04-11 | DRG: 699 | Disposition: A | Discharge status: Skilled Nursing Facility | Payer: Medicare Other | Attending: Internal Medicine | Admitting: Internal Medicine

## 2013-04-07 DIAGNOSIS — J309 Allergic rhinitis, unspecified: Secondary | ICD-10-CM | POA: Diagnosis present

## 2013-04-07 DIAGNOSIS — Z923 Personal history of irradiation: Secondary | ICD-10-CM

## 2013-04-07 DIAGNOSIS — Z87891 Personal history of nicotine dependence: Secondary | ICD-10-CM

## 2013-04-07 DIAGNOSIS — I219 Acute myocardial infarction, unspecified: Secondary | ICD-10-CM | POA: Diagnosis present

## 2013-04-07 DIAGNOSIS — N1330 Unspecified hydronephrosis: Secondary | ICD-10-CM | POA: Diagnosis present

## 2013-04-07 DIAGNOSIS — A4902 Methicillin resistant Staphylococcus aureus infection, unspecified site: Secondary | ICD-10-CM | POA: Diagnosis present

## 2013-04-07 DIAGNOSIS — Z79899 Other long term (current) drug therapy: Secondary | ICD-10-CM

## 2013-04-07 DIAGNOSIS — C61 Malignant neoplasm of prostate: Secondary | ICD-10-CM

## 2013-04-07 DIAGNOSIS — I5022 Chronic systolic (congestive) heart failure: Secondary | ICD-10-CM

## 2013-04-07 DIAGNOSIS — I10 Essential (primary) hypertension: Secondary | ICD-10-CM | POA: Diagnosis present

## 2013-04-07 DIAGNOSIS — Z6825 Body mass index (BMI) 25.0-25.9, adult: Secondary | ICD-10-CM

## 2013-04-07 DIAGNOSIS — S0003XA Contusion of scalp, initial encounter: Secondary | ICD-10-CM | POA: Diagnosis present

## 2013-04-07 DIAGNOSIS — R9431 Abnormal electrocardiogram [ECG] [EKG]: Secondary | ICD-10-CM

## 2013-04-07 DIAGNOSIS — Z8739 Personal history of other diseases of the musculoskeletal system and connective tissue: Secondary | ICD-10-CM | POA: Diagnosis present

## 2013-04-07 DIAGNOSIS — Z794 Long term (current) use of insulin: Secondary | ICD-10-CM

## 2013-04-07 DIAGNOSIS — E872 Acidosis, unspecified: Secondary | ICD-10-CM

## 2013-04-07 DIAGNOSIS — R52 Pain, unspecified: Secondary | ICD-10-CM

## 2013-04-07 DIAGNOSIS — W19XXXA Unspecified fall, initial encounter: Secondary | ICD-10-CM

## 2013-04-07 DIAGNOSIS — N390 Urinary tract infection, site not specified: Secondary | ICD-10-CM

## 2013-04-07 DIAGNOSIS — R531 Weakness: Secondary | ICD-10-CM

## 2013-04-07 DIAGNOSIS — I509 Heart failure, unspecified: Secondary | ICD-10-CM

## 2013-04-07 DIAGNOSIS — N184 Chronic kidney disease, stage 4 (severe): Secondary | ICD-10-CM

## 2013-04-07 DIAGNOSIS — D638 Anemia in other chronic diseases classified elsewhere: Secondary | ICD-10-CM

## 2013-04-07 DIAGNOSIS — Y92009 Unspecified place in unspecified non-institutional (private) residence as the place of occurrence of the external cause: Secondary | ICD-10-CM

## 2013-04-07 DIAGNOSIS — K7689 Other specified diseases of liver: Secondary | ICD-10-CM | POA: Diagnosis present

## 2013-04-07 DIAGNOSIS — E860 Dehydration: Secondary | ICD-10-CM

## 2013-04-07 DIAGNOSIS — I252 Old myocardial infarction: Secondary | ICD-10-CM

## 2013-04-07 DIAGNOSIS — N289 Disorder of kidney and ureter, unspecified: Secondary | ICD-10-CM

## 2013-04-07 DIAGNOSIS — I428 Other cardiomyopathies: Secondary | ICD-10-CM | POA: Diagnosis present

## 2013-04-07 DIAGNOSIS — C7952 Secondary malignant neoplasm of bone marrow: Secondary | ICD-10-CM | POA: Diagnosis present

## 2013-04-07 DIAGNOSIS — R7989 Other specified abnormal findings of blood chemistry: Secondary | ICD-10-CM

## 2013-04-07 DIAGNOSIS — T83511A Infection and inflammatory reaction due to indwelling urethral catheter, initial encounter: Principal | ICD-10-CM | POA: Diagnosis present

## 2013-04-07 DIAGNOSIS — E119 Type 2 diabetes mellitus without complications: Secondary | ICD-10-CM | POA: Diagnosis present

## 2013-04-07 DIAGNOSIS — D649 Anemia, unspecified: Secondary | ICD-10-CM

## 2013-04-07 DIAGNOSIS — C7951 Secondary malignant neoplasm of bone: Secondary | ICD-10-CM | POA: Diagnosis present

## 2013-04-07 DIAGNOSIS — Y846 Urinary catheterization as the cause of abnormal reaction of the patient, or of later complication, without mention of misadventure at the time of the procedure: Secondary | ICD-10-CM | POA: Diagnosis present

## 2013-04-07 DIAGNOSIS — D631 Anemia in chronic kidney disease: Secondary | ICD-10-CM | POA: Diagnosis present

## 2013-04-07 DIAGNOSIS — S0083XA Contusion of other part of head, initial encounter: Secondary | ICD-10-CM

## 2013-04-07 DIAGNOSIS — R748 Abnormal levels of other serum enzymes: Secondary | ICD-10-CM

## 2013-04-07 DIAGNOSIS — E785 Hyperlipidemia, unspecified: Secondary | ICD-10-CM | POA: Diagnosis present

## 2013-04-07 DIAGNOSIS — I2510 Atherosclerotic heart disease of native coronary artery without angina pectoris: Secondary | ICD-10-CM | POA: Diagnosis present

## 2013-04-07 DIAGNOSIS — M109 Gout, unspecified: Secondary | ICD-10-CM | POA: Diagnosis present

## 2013-04-07 DIAGNOSIS — R627 Adult failure to thrive: Secondary | ICD-10-CM | POA: Diagnosis present

## 2013-04-07 DIAGNOSIS — L299 Pruritus, unspecified: Secondary | ICD-10-CM

## 2013-04-07 DIAGNOSIS — Z66 Do not resuscitate: Secondary | ICD-10-CM | POA: Diagnosis present

## 2013-04-07 DIAGNOSIS — E669 Obesity, unspecified: Secondary | ICD-10-CM | POA: Diagnosis present

## 2013-04-07 DIAGNOSIS — C800 Disseminated malignant neoplasm, unspecified: Secondary | ICD-10-CM

## 2013-04-07 DIAGNOSIS — Z515 Encounter for palliative care: Secondary | ICD-10-CM

## 2013-04-07 DIAGNOSIS — N139 Obstructive and reflux uropathy, unspecified: Secondary | ICD-10-CM | POA: Diagnosis present

## 2013-04-07 DIAGNOSIS — Z8719 Personal history of other diseases of the digestive system: Secondary | ICD-10-CM

## 2013-04-07 DIAGNOSIS — I951 Orthostatic hypotension: Secondary | ICD-10-CM

## 2013-04-07 DIAGNOSIS — R55 Syncope and collapse: Secondary | ICD-10-CM

## 2013-04-07 DIAGNOSIS — I129 Hypertensive chronic kidney disease with stage 1 through stage 4 chronic kidney disease, or unspecified chronic kidney disease: Secondary | ICD-10-CM | POA: Diagnosis present

## 2013-04-07 HISTORY — DX: Heart failure, unspecified: I50.9

## 2013-04-07 HISTORY — DX: Chronic systolic (congestive) heart failure: I50.22

## 2013-04-07 HISTORY — DX: Presence of urogenital implants: Z96.0

## 2013-04-07 HISTORY — DX: Unspecified fall, initial encounter: W19.XXXA

## 2013-04-07 HISTORY — DX: Presence of other specified devices: Z97.8

## 2013-04-07 HISTORY — DX: Unspecified place in unspecified non-institutional (private) residence as the place of occurrence of the external cause: Y92.009

## 2013-04-07 HISTORY — DX: Type 2 diabetes mellitus without complications: E11.9

## 2013-04-07 LAB — COMPREHENSIVE METABOLIC PANEL
Alkaline Phosphatase: 283 U/L — ABNORMAL HIGH (ref 39–117)
BUN: 36 mg/dL — ABNORMAL HIGH (ref 6–23)
Calcium: 8.8 mg/dL (ref 8.4–10.5)
GFR calc Af Amer: 23 mL/min — ABNORMAL LOW (ref >90)
Glucose, Bld: 104 mg/dL — ABNORMAL HIGH (ref 70–99)
Total Protein: 6 g/dL (ref 6.0–8.3)

## 2013-04-07 LAB — CBC WITH DIFFERENTIAL/PLATELET
Eosinophils Absolute: 0.3 10*3/uL (ref 0.0–0.7)
Eosinophils Relative: 6 % — ABNORMAL HIGH (ref 0–5)
Hemoglobin: 9 g/dL — ABNORMAL LOW (ref 13.0–17.0)
Lymphs Abs: 1 10*3/uL (ref 0.7–4.0)
MCH: 25.9 pg — ABNORMAL LOW (ref 26.0–34.0)
MCV: 76.9 fL — ABNORMAL LOW (ref 78.0–100.0)
Monocytes Absolute: 0.3 10*3/uL (ref 0.1–1.0)
Monocytes Relative: 7 % (ref 3–12)
RBC: 3.47 MIL/uL — ABNORMAL LOW (ref 4.22–5.81)

## 2013-04-07 LAB — URINALYSIS, ROUTINE W REFLEX MICROSCOPIC
Ketones, ur: 15 mg/dL — AB
Nitrite: POSITIVE — AB
Specific Gravity, Urine: 1.016 (ref 1.005–1.030)
Urobilinogen, UA: 1 mg/dL (ref 0.0–1.0)
pH: 6 (ref 5.0–8.0)

## 2013-04-07 LAB — URINE MICROSCOPIC-ADD ON

## 2013-04-07 LAB — POCT I-STAT TROPONIN I

## 2013-04-07 MED ORDER — ENOXAPARIN SODIUM 40 MG/0.4ML ~~LOC~~ SOLN
40.0000 mg | SUBCUTANEOUS | Status: DC
  Filled 2013-04-07 (×2): qty 0.4

## 2013-04-07 MED ORDER — SODIUM BICARBONATE 650 MG PO TABS
325.0000 mg | ORAL_TABLET | Freq: Two times a day (BID) | ORAL | Status: DC
  Administered 2013-04-07 – 2013-04-09 (×4): 325 mg via ORAL
  Filled 2013-04-07 (×8): qty 0.5

## 2013-04-07 MED ORDER — ACETAMINOPHEN 650 MG RE SUPP: 650.0000 mg | Freq: Four times a day (QID) | RECTAL | Status: DC | PRN

## 2013-04-07 MED ORDER — LINAGLIPTIN 5 MG PO TABS
5.0000 mg | ORAL_TABLET | Freq: Every day | ORAL | Status: DC
  Administered 2013-04-08 – 2013-04-11 (×4): 5 mg via ORAL
  Filled 2013-04-07 (×4): qty 1

## 2013-04-07 MED ORDER — HYDROCORTISONE 1 % EX CREA
TOPICAL_CREAM | Freq: Two times a day (BID) | CUTANEOUS | Status: DC | PRN
  Administered 2013-04-08: 12:00:00 via TOPICAL
  Filled 2013-04-07: qty 28

## 2013-04-07 MED ORDER — TAMSULOSIN HCL 0.4 MG PO CAPS
0.4000 mg | ORAL_CAPSULE | Freq: Every day | ORAL | Status: DC
  Administered 2013-04-08 – 2013-04-11 (×4): 0.4 mg via ORAL
  Filled 2013-04-07 (×4): qty 1

## 2013-04-07 MED ORDER — ACETAMINOPHEN 325 MG PO TABS: 650.0000 mg | ORAL_TABLET | Freq: Four times a day (QID) | ORAL | Status: DC | PRN

## 2013-04-07 MED ORDER — SODIUM CHLORIDE 0.9 % IJ SOLN
3.0000 mL | Freq: Two times a day (BID) | INTRAMUSCULAR | Status: DC
  Administered 2013-04-07 – 2013-04-11 (×6): 3 mL via INTRAVENOUS

## 2013-04-07 MED ORDER — SODIUM CHLORIDE 0.9 % IV SOLN
INTRAVENOUS | Status: DC
  Administered 2013-04-07 – 2013-04-08 (×2): via INTRAVENOUS
  Administered 2013-04-09: 1000 mL via INTRAVENOUS
  Administered 2013-04-09: 100 mL/h via INTRAVENOUS

## 2013-04-07 MED ORDER — OXYCODONE-ACETAMINOPHEN 5-325 MG PO TABS
1.0000 | ORAL_TABLET | ORAL | Status: DC | PRN
  Administered 2013-04-10 – 2013-04-11 (×3): 1 via ORAL
  Filled 2013-04-07 (×3): qty 1

## 2013-04-07 MED ORDER — ALLOPURINOL 100 MG PO TABS
200.0000 mg | ORAL_TABLET | Freq: Every day | ORAL | Status: DC
  Administered 2013-04-08 – 2013-04-11 (×4): 200 mg via ORAL
  Filled 2013-04-07 (×4): qty 2

## 2013-04-07 MED ORDER — CEFTRIAXONE SODIUM 1 G IJ SOLR
1.0000 g | INTRAMUSCULAR | Status: DC
  Administered 2013-04-08 – 2013-04-09 (×2): 1 g via INTRAVENOUS
  Filled 2013-04-07 (×2): qty 10

## 2013-04-07 MED ORDER — DEXTROSE 5 % IV SOLN
1.0000 g | Freq: Once | INTRAVENOUS | Status: AC
  Administered 2013-04-07: 1 g via INTRAVENOUS
  Filled 2013-04-07: qty 10

## 2013-04-07 MED ORDER — BICALUTAMIDE 50 MG PO TABS
50.0000 mg | ORAL_TABLET | Freq: Every day | ORAL | Status: DC
  Administered 2013-04-08 – 2013-04-11 (×4): 50 mg via ORAL
  Filled 2013-04-07 (×4): qty 1

## 2013-04-07 MED ORDER — SODIUM CHLORIDE 0.9 % IV BOLUS (SEPSIS)
800.0000 mL | Freq: Once | INTRAVENOUS | Status: AC
  Administered 2013-04-07: 800 mL via INTRAVENOUS

## 2013-04-07 MED ORDER — SODIUM CHLORIDE 0.9 % IV BOLUS (SEPSIS)
1000.0000 mL | Freq: Once | INTRAVENOUS | Status: AC
  Administered 2013-04-07: 1000 mL via INTRAVENOUS

## 2013-04-07 MED ORDER — SIMVASTATIN 20 MG PO TABS
20.0000 mg | ORAL_TABLET | Freq: Every evening | ORAL | Status: DC
  Administered 2013-04-07 – 2013-04-10 (×4): 20 mg via ORAL
  Filled 2013-04-07 (×5): qty 1

## 2013-04-07 NOTE — H&P (Signed)
[facility] History and Physical  Xavi Tomasik RUE:[id number] DOB: 1935-02-17 DOA: 04/07/2013  Referring physician: ED PCP: Leo Grosser, MD    Chief Complaint:  Chief Complaint  Patient presents with  . Weakness  . Fall     HPI: [name] is a 78 y.o. male with multiple medical problems, including stage IV prostate cancer, chronic kidney disease, congestive heart failure, diabetes, hypertension, gout, anemia,who was brought to the emergency room via EMS after a fall at home today. He was found to be hypotensive. Patient reports that he lives with his ex-wife and that he has little food and water during the day. He reports pain everywhere from his metastatic prostate cancer. Patient has a chronic indwelling Foley catheter. Patient denies any chest pain.  History is gathered from the patient and from reviewing extensive prior records  Review of Systems:  Patient reports chills weakness and difficulty ambulating The patient denies anorexia, fever, vision loss, decreased hearing, hoarseness, chest pain,  peripheral edema,  hemoptysis, abdominal pain, melena, hematochezia, severe indigestion/heartburn, hematuria, incontinence, genital sores, suspicious skin lesions, transient blindness,  depression,   Past Medical History  Diagnosis Date  . CAD (coronary artery disease)   . Hypertension   . Obesity   . Dyslipidemia   . Anemia   . H/O hemorrhoids   . H/O: GI bleed   . Diabetes mellitus   . Rhinitis, allergic   . Carpal tunnel syndrome   . Fatty liver   . Shoulder dislocation 1970    right  . Myocardial infarction 2007  . History of gout   . Asthma   . Arthritis     "all over my whole body" (03/07/2013)  . Chronic kidney disease (CKD), stage III (moderate)     ckd III  . Prostate cancer 02/11/13-02/20/13    metastatic prostate cancer  . Bone metastasis    Past Surgical History  Procedure Laterality Date  . Transurethral resection of prostate  06/08/2011,  11/08/06  . Nerve repair Right 1974    "hand" (03/07/2013)   Social History:  reports that he has quit smoking. He has never used smokeless tobacco. He reports that he does not drink alcohol or use illicit drugs. Patient is unable to provide for himself Patient lives with his ex-wife  Allergies  Allergen Reactions  . Aspirin Other (See Comments)    "makes my stomach hurt real bad."  . Feldene (Piroxicam) Swelling    Family History  Problem Relation Age of Onset  . Diabetes Mother     Prior to Admission medications   Medication Sig Start Date End Date Taking? Authorizing Provider  allopurinol (ZYLOPRIM) 100 MG tablet Take 200 mg by mouth daily.   Yes Historical Provider, MD  amLODipine (NORVASC) 5 MG tablet Take 5 mg by mouth daily.  03/12/13  Yes Historical Provider, MD  bicalutamide (CASODEX) 50 MG tablet Take 50 mg by mouth daily.  03/12/13  Yes Historical Provider, MD  ciprofloxacin (CIPRO) 250 MG tablet Take 1 tablet (250 mg total) by mouth 2 (two) times daily. For 14 days 04/05/13  Yes Donita Brooks, MD  furosemide (LASIX) 40 MG tablet Take 40 mg by mouth 2 (two) times daily.   Yes Historical Provider, MD  hydrALAZINE (APRESOLINE) 25 MG tablet Take 1 tablet by mouth 3 (three) times daily. 03/12/13  Yes Historical Provider, MD  isosorbide mononitrate (IMDUR) 60 MG 24 hr tablet Take 60 mg by mouth daily.  03/12/13  Yes Historical Provider, MD  losartan (  COZAAR) 50 MG tablet Take 1 tablet (50 mg total) by mouth daily. 03/21/13  Yes Donita Brooks, MD  metoprolol succinate (TOPROL-XL) 25 MG 24 hr tablet Take 25 mg by mouth daily.   Yes Historical Provider, MD  nitroGLYCERIN (NITROSTAT) 0.4 MG SL tablet Place 0.4 mg under the tongue every 5 (five) minutes as needed.     Yes Historical Provider, MD  oxyCODONE-acetaminophen (PERCOCET) 5-325 MG per tablet Take 1 tablet by mouth every 4 (four) hours as needed for pain. 03/22/13  Yes Hannah Muthersbaugh, PA-C  simvastatin (ZOCOR) 20 MG tablet  Take 20 mg by mouth every evening.   Yes Historical Provider, MD  sitaGLIPtin (JANUVIA) 25 MG tablet Take 1 tablet (25 mg total) by mouth daily with breakfast. 05/27/12 05/27/13 Yes Adeline C Viyuoh, MD  Tamsulosin HCl (FLOMAX) 0.4 MG CAPS Take 0.4 mg by mouth daily.   Yes Historical Provider, MD   Physical Exam: Filed Vitals:   04/07/13 1522 04/07/13 1540 04/07/13 1600 04/07/13 1651  BP: 117/67 107/58 127/60 118/68  Pulse:  78 77 77  Temp:      TempSrc:      Resp: 20 22 17 18   SpO2: 100% 100% 100% 100%     General:  Alert and oriented x3  Eyes: icterus vs muddy sclera  ENT: edentulous  Neck: no jugular venous distention  Cardiovascular: regular rate and rhythm without murmurs rubs or gallops  Respiratory: clear to auscultation bilaterally  Abdomen: soft nontender  Skin: pale dry  Musculoskeletal: diminished muscle bulk and tone  Psychiatric: euthymic  Neurologic: cranial nerves 2-12 intact, strength 4 out of 5 in the 4 extremities, sensation intact  Labs on Admission:  Basic Metabolic Panel:  Recent Labs Lab 04/05/13 1107 04/07/13 1339  NA 136 132*  K 5.8* 4.6  CL 111 106  CO2 13* 18*  GLUCOSE 144* 104*  BUN 39* 36*  CREATININE 2.97* 2.86*  CALCIUM 9.4 8.8   Liver Function Tests:  Recent Labs Lab 04/07/13 1339  AST 18  ALT 10  ALKPHOS 283*  BILITOT 0.5  PROT 6.0  ALBUMIN 2.6*   No results found for this basename: LIPASE, AMYLASE,  in the last 168 hours No results found for this basename: AMMONIA,  in the last 168 hours CBC:  Recent Labs Lab 04/05/13 1107 04/07/13 1339  WBC 6.0 4.7  NEUTROABS 4.0 3.1  HGB 9.0* 9.0*  HCT 28.5* 26.7*  MCV 80.5 76.9*  PLT 408* 402*   Cardiac Enzymes: No results found for this basename: CKTOTAL, CKMB, CKMBINDEX, TROPONINI,  in the last 168 hours  BNP (last 3 results)  Recent Labs  03/07/13 1010 03/21/13 1425  PROBNP 22646.0* 22776.00*   CBG: No results found for this basename: GLUCAP,  in the last  168 hours  Radiological Exams on Admission: Ct Head Wo Contrast  04/07/2013  *RADIOLOGY REPORT*  Clinical Data:  Fall, right facial trauma, dizziness.  Diffuse bony metastatic disease, prostate cancer.  CT HEAD WITHOUT CONTRAST CT MAXILLOFACIAL WITHOUT CONTRAST  Technique:  Multidetector CT imaging of the head and maxillofacial structures were performed using the standard protocol without intravenous contrast. Multiplanar CT image reconstructions of the maxillofacial structures were also generated.  Comparison:  No similar prior study is available for comparison. Bone scan 02/05/2013 is reviewed.  CT HEAD  Findings: Examination is degraded by patient motion.  Images were repeated. Cortical volume loss noted with proportional ventricular prominence.  Periventricular white matter hypodensity likely indicates small vessel ischemic change.  No acute hemorrhage, acute infarction, or mass lesion is identified.  Remote bilateral external capsule lacunar infarcts.  Deformity of the right medial orbital wall is noted without acute fracture line visible.  IMPRESSION: No acute intracranial finding.  Right medial orbital wall deformity, detailed assess further below.  CT MAXILLOFACIAL  Findings:   The patient is edentulous.  Mild anterior displacement of the mandibular condyle is noted which could indicate subluxation of unknown chronicity.  Medial deformity of the medial right orbital wall is noted without fracture line.  The intraconal contents are normal as well as the globes.  Paranasal sinuses otherwise unremarkable.  No fracture or acute osseous abnormality. There is mild right facial subcutaneous fat stranding and swelling. No underlying fluid collection identified.  Cervical spine degenerative changes are partly visualized. Bilateral ostiomeatal units are patent.  Material within the right external auditory canal is compatible with cerumen.  IMPRESSION: Right facial soft tissue swelling without underlying facial  bone fracture.  Apparent remote deformity of the medial right orbital wall.  No acute fracture identified.   Original Report Authenticated By: Christiana Pellant, M.D.    Ct Maxillofacial Wo Cm  04/07/2013  *RADIOLOGY REPORT*  Clinical Data:  Fall, right facial trauma, dizziness.  Diffuse bony metastatic disease, prostate cancer.  CT HEAD WITHOUT CONTRAST CT MAXILLOFACIAL WITHOUT CONTRAST  Technique:  Multidetector CT imaging of the head and maxillofacial structures were performed using the standard protocol without intravenous contrast. Multiplanar CT image reconstructions of the maxillofacial structures were also generated.  Comparison:  No similar prior study is available for comparison. Bone scan 02/05/2013 is reviewed.  CT HEAD  Findings: Examination is degraded by patient motion.  Images were repeated. Cortical volume loss noted with proportional ventricular prominence.  Periventricular white matter hypodensity likely indicates small vessel ischemic change.  No acute hemorrhage, acute infarction, or mass lesion is identified.  Remote bilateral external capsule lacunar infarcts.  Deformity of the right medial orbital wall is noted without acute fracture line visible.  IMPRESSION: No acute intracranial finding.  Right medial orbital wall deformity, detailed assess further below.  CT MAXILLOFACIAL  Findings:   The patient is edentulous.  Mild anterior displacement of the mandibular condyle is noted which could indicate subluxation of unknown chronicity.  Medial deformity of the medial right orbital wall is noted without fracture line.  The intraconal contents are normal as well as the globes.  Paranasal sinuses otherwise unremarkable.  No fracture or acute osseous abnormality. There is mild right facial subcutaneous fat stranding and swelling. No underlying fluid collection identified.  Cervical spine degenerative changes are partly visualized. Bilateral ostiomeatal units are patent.  Material within the right  external auditory canal is compatible with cerumen.  IMPRESSION: Right facial soft tissue swelling without underlying facial bone fracture.  Apparent remote deformity of the medial right orbital wall.  No acute fracture identified.   Original Report Authenticated By: Christiana Pellant, M.D.     EKG: Independently reviewed. Sinus rhythm, premature ventricular contractions, minor left bundle branch block, negative T waves in anterolateral leads  Assessment/Plan Active Problems:   Essential hypertension, benign   Hypertension   H/O: gout   UTI (lower urinary tract infection)   Diabetes mellitus   Hydronephrosis, bilateral   Prostate cancer metastatic to bone   Metabolic acidosis   Obstructive uropathy   Metastatic prostate cancer to bone.    Anemia of chronic disease   Weakness generalized   Chronic systolic CHF (congestive heart failure)   CKD (chronic kidney  disease) stage 4, GFR 15-29 ml/min   Myocardial infarction   H/O: GI bleed   1. Failure to thrive-multifactorial. Currently patient is orthostatic probably from polypharmacy and poor oral intake. He also has hypertension and chronic systolic congestive heart failure and I would suspect that he may have difficulty tolerating IV fluids. For now I will hold diuretics, angiotensin receptor blocker, beta blocker, Norvasc. The patient will be on IV fluids for 12 hours. He will need a gradual restart of cardiac medications once blood pressure stabilizes. 2. Recurrent urinary tract infections in a patient with indwelling Foley catheter. Last urine culture was positive for methicillin sensitive Staphylococcus aureus. I am worried that the patient actually may be bacteremic. I'm going to obtain 2 blood cultures together with the urine culture and start the patient on empiric Rocephin 3. Stage IV prostate cancer - he is status post palliative radiotherapy. His disease is progressing despite adequate hormonal treatment. We'll proceed with palliative  care consultation given his medical comorbidities. Furthermore he does not have a stable living situation so he needs Child psychotherapist consultation for permanent placement 4. Diabetes mellitus type 2-lately well controlled probably due to weight loss. Continue Januvia 5. Chronic kidney disease stage IV-stable for years-not a hemodialysis candidate. We'll go ahead and start oral bicarbonate as well 6. History of gout-continue allopurinol for prevention 7. Coronary artery disease-with history of myocardial infarctions. He has never had adequate renal function to undergo a cardiac catheterization. He has had multiple episodes of gastrointestinal bleeding so is not a candidate for aspirin and Plavix. Currently he is asymptomatic. We will continue statin  Code Status: full  Family Communication: patient indicated that his primary contact person is his youngest daughter Noelle Hoogland [phone] Disposition Plan: unclear   Time spent: 2 hours  Sunday Klos [facility] Pager [phone]  If 7PM-7AM, please contact night-coverage [email or web address] Password [facility] 04/07/2013, 5:32 PM

## 2013-04-07 NOTE — ED Notes (Signed)
Dr. Lavera Guise at bedside

## 2013-04-07 NOTE — Progress Notes (Signed)
Pt transferred to room 4737 from ED via stretcher. Pt A&O. Assessment completed. Oriented pt to room and unit and placed call bell in reach. Pt denied pain or concerns. Report received from [facility]. Will continue to monitor pt closely.  Juliane Lack, RN

## 2013-04-07 NOTE — ED Notes (Signed)
Pt came into the ER with a foley leg bag

## 2013-04-07 NOTE — ED Notes (Signed)
Pt transported to CT ?

## 2013-04-07 NOTE — ED Provider Notes (Signed)
History     CSN: [id number]  Arrival date & time 04/07/13  1310   First MD Initiated Contact with Patient 04/07/13 1321      Chief Complaint  Patient presents with  . Weakness  . Fall    (Consider location/radiation/quality/duration/timing/severity/associated sxs/prior treatment) HPI  Patient reports she was seen by his PCP 2 days ago for regular appointment he states yesterday and today when he stood up he "fell out". He states when he stands up he gets dizzy and then he falls down. He denies actually having loss of consciousness. He states today he hit his right face when he fell he also hurt his right wrist and both his knees. He states he is feeling weak. He denies any rectal bleeding or melena. He denies any spinning sensation before he falls. He denies chest pain, shortness of breath, nausea, vomiting, diarrhea, or any headache. Patient states he has had decreased appetite since he started getting radiation treatment for prostate cancer the past couple weeks. He also complains of diffuse itching. EMS told nursing staff patient was orthostatic on their arrival.  Patient also states his ex-wife has to go to work and cannot stay home and take care of them because of his falling. He also states his daughter has told them she cannot take him to his doctor's appointments this week.  PCP Dr. Tanya Nones at [facility] family practice Urologist Dr. Hillis Range  Past Medical History  Diagnosis Date  . CAD (coronary artery disease)   . Hypertension   . Obesity   . Dyslipidemia   . Anemia   . H/O hemorrhoids   . Pneumonia   . H/O: GI bleed   . Diabetes mellitus   . Rhinitis, allergic   . Carpal tunnel syndrome   . Fatty liver   . Shoulder dislocation 1970    right  . Myocardial infarction 2007  . History of gout   . Asthma   . Exertional shortness of breath     "sometimes" (03/07/2013)  . Arthritis     "all over my whole body" (03/07/2013)  . Chronic kidney disease (CKD), stage  III (moderate)     ckd III  . Prostate cancer 02/11/13-02/20/13    metastatic prostate cancer  . Bone metastasis     Past Surgical History  Procedure Laterality Date  . Transurethral resection of prostate  06/08/2011, 11/08/06  . Nerve repair Right 1974    "hand" (03/07/2013)    Family History  Problem Relation Age of Onset  . Diabetes Mother     History  Substance Use Topics  . Smoking status: Former Games developer  . Smokeless tobacco: Never Used     Comment: QUIT "YEARS" AGO  . Alcohol Use: No     Comment: former heavy use   Lives at home Lives with ex-wife   Review of Systems  All other systems reviewed and are negative.    Allergies  Aspirin and Feldene  Home Medications   Current Outpatient Rx  Name  Route  Sig  Dispense  Refill  . allopurinol (ZYLOPRIM) 100 MG tablet   Oral   Take 200 mg by mouth daily.         Marland Kitchen amLODipine (NORVASC) 5 MG tablet   Oral   Take 5 mg by mouth daily.          . bicalutamide (CASODEX) 50 MG tablet   Oral   Take 50 mg by mouth daily.          Marland Kitchen  ciprofloxacin (CIPRO) 250 MG tablet   Oral   Take 1 tablet (250 mg total) by mouth 2 (two) times daily. For 14 days   28 tablet   0   . furosemide (LASIX) 40 MG tablet   Oral   Take 40 mg by mouth 2 (two) times daily.         . hydrALAZINE (APRESOLINE) 25 MG tablet   Oral   Take 1 tablet by mouth 3 (three) times daily.         . isosorbide mononitrate (IMDUR) 60 MG 24 hr tablet   Oral   Take 60 mg by mouth daily.          Marland Kitchen losartan (COZAAR) 50 MG tablet   Oral   Take 1 tablet (50 mg total) by mouth daily.   90 tablet   3   . metoprolol succinate (TOPROL-XL) 25 MG 24 hr tablet   Oral   Take 25 mg by mouth daily.         . nitroGLYCERIN (NITROSTAT) 0.4 MG SL tablet   Sublingual   Place 0.4 mg under the tongue every 5 (five) minutes as needed.           Marland Kitchen oxyCODONE-acetaminophen (PERCOCET) 5-325 MG per tablet   Oral   Take 1 tablet by mouth every 4  (four) hours as needed for pain.   20 tablet   0   . simvastatin (ZOCOR) 20 MG tablet   Oral   Take 20 mg by mouth every evening.         . sitaGLIPtin (JANUVIA) 25 MG tablet   Oral   Take 1 tablet (25 mg total) by mouth daily with breakfast.   30 tablet   0   . Tamsulosin HCl (FLOMAX) 0.4 MG CAPS   Oral   Take 0.4 mg by mouth daily.           BP 102/58  Temp(Src) 97.9 F (36.6 C) (Oral)  Resp 20  SpO2 94%  Vital signs normal    Physical Exam  Nursing note and vitals reviewed. Constitutional: He is oriented to person, place, and time. He appears well-developed and well-nourished.  Non-toxic appearance. He does not appear ill. No distress.  Appears diffusely weak.  HENT:  Head: Normocephalic and atraumatic.  Right Ear: External ear normal.  Left Ear: External ear normal.  Nose: Nose normal. No mucosal edema or rhinorrhea.  Mouth/Throat: Mucous membranes are normal. No dental abscesses or edematous.  Dry tongue, tender over his right cheek area near the zygomatic arch without crepitance or deformity. Patient states his pain it is in his jaw however his pain is not in his mandible its in his cheek.  Eyes: Conjunctivae and EOM are normal. Pupils are equal, round, and reactive to light.  Neck: Normal range of motion and full passive range of motion without pain. Neck supple.  Cardiovascular: Normal rate, regular rhythm and normal heart sounds.  Exam reveals no gallop and no friction rub.   No murmur heard. Pulmonary/Chest: Effort normal and breath sounds normal. No respiratory distress. He has no wheezes. He has no rhonchi. He has no rales. He exhibits no tenderness and no crepitus.  Abdominal: Soft. Normal appearance and bowel sounds are normal. He exhibits no distension. There is no tenderness. There is no rebound and no guarding.  Musculoskeletal: Normal range of motion. He exhibits no edema and no tenderness.  Moves all extremities well.   Neurological: He is alert  and  oriented to person, place, and time. He has normal strength. No cranial nerve deficit.  Skin: Skin is warm, dry and intact. No rash noted. No erythema. No pallor.  Psychiatric: His speech is normal and behavior is normal. His mood appears not anxious.  Very flat affect    ED Course  Procedures (including critical care time)  Medications  cefTRIAXone (ROCEPHIN) 1 g in dextrose 5 % 50 mL IVPB (not administered)  sodium chloride 0.9 % bolus 800 mL (0 mLs Intravenous Stopped 04/07/13 1452)  sodium chloride 0.9 % bolus 1,000 mL (1,000 mLs Intravenous New Bag/Given 04/07/13 1616)   Pt refused xrays of his wrist and knees.   Orthostatic vital signs were attempted. However when patient sat on the side of bed his blood pressure dropped to 83/63 and patient was unable to continue.  Patient given IV fluids for his dehydration and orthostatic hypotension which is the most likely etiology for his near syncopal events with  falling. Patient reports lack of appetite since he has started radiation. He was started on antibiotics for his UTI. Patient has had a chronic indwelling Foley catheter while he is getting his radiation treatment done.  Patient complaining of pruritus and has a very elevated alkaline phosphatase.  16:19 Dr Lavera Guise, hospitalist, admit to tele, team 7.  Results for orders placed during the hospital encounter of 04/07/13  CBC WITH DIFFERENTIAL      Result Value Range   WBC 4.7  4.0 - 10.5 K/uL   RBC 3.47 (*) 4.22 - 5.81 MIL/uL   Hemoglobin 9.0 (*) 13.0 - 17.0 g/dL   HCT 19.1 (*) 47.8 - 29.5 %   MCV 76.9 (*) 78.0 - 100.0 fL   MCH 25.9 (*) 26.0 - 34.0 pg   MCHC 33.7  30.0 - 36.0 g/dL   RDW 62.1 (*) 30.8 - 65.7 %   Platelets 402 (*) 150 - 400 K/uL   Neutrophils Relative 66  43 - 77 %   Neutro Abs 3.1  1.7 - 7.7 K/uL   Lymphocytes Relative 21  12 - 46 %   Lymphs Abs 1.0  0.7 - 4.0 K/uL   Monocytes Relative 7  3 - 12 %   Monocytes Absolute 0.3  0.1 - 1.0 K/uL   Eosinophils  Relative 6 (*) 0 - 5 %   Eosinophils Absolute 0.3  0.0 - 0.7 K/uL   Basophils Relative 1  0 - 1 %   Basophils Absolute 0.0  0.0 - 0.1 K/uL  COMPREHENSIVE METABOLIC PANEL      Result Value Range   Sodium 132 (*) 135 - 145 mEq/L   Potassium 4.6  3.5 - 5.1 mEq/L   Chloride 106  96 - 112 mEq/L   CO2 18 (*) 19 - 32 mEq/L   Glucose, Bld 104 (*) 70 - 99 mg/dL   BUN 36 (*) 6 - 23 mg/dL   Creatinine, Ser 8.46 (*) 0.50 - 1.35 mg/dL   Calcium 8.8  8.4 - 96.2 mg/dL   Total Protein 6.0  6.0 - 8.3 g/dL   Albumin 2.6 (*) 3.5 - 5.2 g/dL   AST 18  0 - 37 U/L   ALT 10  0 - 53 U/L   Alkaline Phosphatase 283 (*) 39 - 117 U/L   Total Bilirubin 0.5  0.3 - 1.2 mg/dL   GFR calc non Af Amer 20 (*) >90 mL/min   GFR calc Af Amer 23 (*) >90 mL/min  URINALYSIS, ROUTINE W REFLEX MICROSCOPIC  Result Value Range   Color, Urine AMBER (*) YELLOW   APPearance TURBID (*) CLEAR   Specific Gravity, Urine 1.016  1.005 - 1.030   pH 6.0  5.0 - 8.0   Glucose, UA NEGATIVE  NEGATIVE mg/dL   Hgb urine dipstick LARGE (*) NEGATIVE   Bilirubin Urine NEGATIVE  NEGATIVE   Ketones, ur 15 (*) NEGATIVE mg/dL   Protein, ur >409 (*) NEGATIVE mg/dL   Urobilinogen, UA 1.0  0.0 - 1.0 mg/dL   Nitrite POSITIVE (*) NEGATIVE   Leukocytes, UA LARGE (*) NEGATIVE  URINE MICROSCOPIC-ADD ON      Result Value Range   WBC, UA TOO NUMEROUS TO COUNT  <3 WBC/hpf   RBC / HPF TOO NUMEROUS TO COUNT  <3 RBC/hpf   Bacteria, UA FEW (*) RARE  POCT I-STAT TROPONIN I      Result Value Range   Troponin i, poc 0.12 (*) 0.00 - 0.08 ng/mL   Comment NOTIFIED PHYSICIAN     Comment 3            Laboratory interpretation all normal except stable anemia, stable renal insuffic, UTI, + troponin, chronic metabolic acidosis  Ct Head Wo Contrast  Ct Maxillofacial Wo Cm  04/07/2013  *RADIOLOGY REPORT*  Clinical Data:  Fall, right facial trauma, dizziness.  Diffuse bony metastatic disease, prostate cancer.  CT HEAD WITHOUT CONTRAST CT MAXILLOFACIAL WITHOUT  CONTRAST  Technique:  Multidetector CT imaging of the head and maxillofacial structures were performed using the standard protocol without intravenous contrast. Multiplanar CT image reconstructions of the maxillofacial structures were also generated.  Comparison:  No similar prior study is available for comparison. Bone scan 02/05/2013 is reviewed.  CT HEAD  Findings: Examination is degraded by patient motion.  Images were repeated. Cortical volume loss noted with proportional ventricular prominence.  Periventricular white matter hypodensity likely indicates small vessel ischemic change.  No acute hemorrhage, acute infarction, or mass lesion is identified.  Remote bilateral external capsule lacunar infarcts.  Deformity of the right medial orbital wall is noted without acute fracture line visible.  IMPRESSION: No acute intracranial finding.  Right medial orbital wall deformity, detailed assess further below.  CT MAXILLOFACIAL  Findings:   The patient is edentulous.  Mild anterior displacement of the mandibular condyle is noted which could indicate subluxation of unknown chronicity.  Medial deformity of the medial right orbital wall is noted without fracture line.  The intraconal contents are normal as well as the globes.  Paranasal sinuses otherwise unremarkable.  No fracture or acute osseous abnormality. There is mild right facial subcutaneous fat stranding and swelling. No underlying fluid collection identified.  Cervical spine degenerative changes are partly visualized. Bilateral ostiomeatal units are patent.  Material within the right external auditory canal is compatible with cerumen.  IMPRESSION: Right facial soft tissue swelling without underlying facial bone fracture.  Apparent remote deformity of the medial right orbital wall.  No acute fracture identified.   Original Report Authenticated By: Christiana Pellant, M.D.     Dg Lumbar Spine Complete  03/21/2013    IMPRESSION: Sclerotic foci scattered throughout  the visualized bones, in keeping with metastatic disease.  Sclerosis/mild irregularity at the L2 vertebral body superior endplate may reflect mild compression deformity.  No retropulsion.   Original Report Authenticated By: Jearld Lesch, M.D.      Date: 04/07/2013  Rate: 72  Rhythm: normal sinus rhythm  QRS Axis: normal  Intervals: normal  ST/T Wave abnormalities: nonspecific T wave changes  Conduction  Disutrbances:left bundle branch block  Narrative Interpretation: PVC's  Old EKG Reviewed: changes noted from 03/07/2013 has more TWI in lateral leads   # 2   Date: 04/07/2013  Rate: 72  Rhythm: normal sinus rhythm and premature ventricular contractions (PVC)  QRS Axis: normal  Intervals: normal  ST/T Wave abnormalities: nonspecific T wave changes  Conduction Disutrbances:none  Narrative Interpretation:   Old EKG Reviewed: unchanged from earlier today     1. Dehydration   2. Orthostatic hypotension   3. Near syncope   4. Fall, initial encounter   5. Contusion of face, initial encounter   6. Anemia   7. Renal insufficiency   8. Metabolic acidosis   9. UTI (lower urinary tract infection)   10. Pruritus   11. Elevated alkaline phosphatase level   12. Elevated troponin   13. Abnormal EKG     Plan admission   Devoria Albe, MD, FACEP   CRITICAL CARE Performed by: Devoria Albe L Total critical care time: 39 min  Critical care time was exclusive of separately billable procedures and treating other patients. Critical care was necessary to treat or prevent imminent or life-threatening deterioration. Critical care was time spent personally by me on the following activities: development of treatment plan with patient and/or surrogate as well as nursing, discussions with consultants, evaluation of patient's response to treatment, examination of patient, obtaining history from patient or surrogate, ordering and performing treatments and interventions, ordering and review of  laboratory studies, ordering and review of radiographic studies, pulse oximetry and re-evaluation of patient's condition.    MDM          Ward Givens, MD 04/07/13 1626

## 2013-04-07 NOTE — ED Notes (Addendum)
Per ems- pt c/o general weakness xseveral weeks. Pt states he is now dizzy upon standing. Pt states he has had several falls over past few days, denies LOC. Pt c/o n/v on Friday, none now. Pt has prostate cancer. 22g IV placed right wrist pta. Orthostatic changes noted by ems. Pt received NS. Alert and oriented x4. BP-99/62 HR-74 RR-18 O2-100% on RA.

## 2013-04-07 NOTE — ED Notes (Signed)
Pt unable to do standing part of orthostatic VS pt became to dizzy and had to lay back down.

## 2013-04-07 NOTE — ED Notes (Addendum)
Pt states he fell today when he stood up. Denies LOC, states he has been very dizzy when he stands up. NAD noted. Pt alert and oriented. No neuro deficits noted. Pt confirms decreased PO intake.

## 2013-04-07 NOTE — ED Notes (Signed)
Pt refused xrays of knees and wrist, states "there is nothing wrong with my knees or wrist."

## 2013-04-08 ENCOUNTER — Encounter (HOSPITAL_COMMUNITY): Payer: Self-pay | Admitting: General Practice

## 2013-04-08 DIAGNOSIS — R5383 Other fatigue: Secondary | ICD-10-CM

## 2013-04-08 DIAGNOSIS — Z515 Encounter for palliative care: Secondary | ICD-10-CM

## 2013-04-08 DIAGNOSIS — R52 Pain, unspecified: Secondary | ICD-10-CM

## 2013-04-08 LAB — CBC
HCT: 25.1 % — ABNORMAL LOW (ref 39.0–52.0)
Hemoglobin: 8.4 g/dL — ABNORMAL LOW (ref 13.0–17.0)
RDW: 18.1 % — ABNORMAL HIGH (ref 11.5–15.5)
WBC: 5.1 10*3/uL (ref 4.0–10.5)

## 2013-04-08 LAB — BASIC METABOLIC PANEL
Chloride: 109 mEq/L (ref 96–112)
Creatinine, Ser: 2.95 mg/dL — ABNORMAL HIGH (ref 0.50–1.35)
GFR calc Af Amer: 22 mL/min — ABNORMAL LOW (ref >90)
Potassium: 4.7 mEq/L (ref 3.5–5.1)

## 2013-04-08 LAB — GLUCOSE, CAPILLARY

## 2013-04-08 MED ORDER — ENOXAPARIN SODIUM 30 MG/0.3ML ~~LOC~~ SOLN
30.0000 mg | SUBCUTANEOUS | Status: DC
  Filled 2013-04-08 (×3): qty 0.3

## 2013-04-08 NOTE — Progress Notes (Signed)
OT Cancellation Note  Patient Details Name: [name] MRN: [id number] DOB: 11-Dec-1934   Cancelled Treatment:     Pallative care meeting today, will await the outcome of this before making decision about evaling pt.  Evette Georges [phone] 04/08/2013, 2:38 PM

## 2013-04-08 NOTE — Progress Notes (Addendum)
ADDENDUM:   Call back received  @ 9:30 am from daughter [name]; meeting time confirmed for today Monday 04/08/13 @ 12:30 pm  Valente David, RN [phone]     Thank you for consulting the Palliative Medicine Team at [facility] to meet your patient's and family's needs.   The reason that you asked [place] to see your patient is for goals of care discussion  We have scheduled your patient for a meeting:  *Awaiting call back from daughter [name] to schedule meeting time  The Surrogate decision maker is:  To be determined - patient A&O x3 on this visit stated his daughter [name] helps with his decisions Contact information:  Argenis Kumari [phone]  Other family members that need to be present:  To be determined  Your patient is able/unable to participate: able to participate    Valente David, RN 04/08/2013, 9:00 AM Palliative Medicine Team RN Liaison [phone]

## 2013-04-08 NOTE — Progress Notes (Signed)
Patient refuses Lovenox injection and states he will not take it; will continue to monitor patient. Lorretta Harp RN

## 2013-04-08 NOTE — Progress Notes (Signed)
Patient [name]      DOB: 1935-08-14      VWU:[id number]  Arrived at room for scheduled (12:30p)Palliative Care Medicine Team conference, patient eating lunch asked me to leave room while he is eating will talk after he finishes. Daughter [name] has not arrived per nurse taking care of patient, no family in room.  Called daughter [name] left message, daughter called room phone informed father she was running late currently in parking lot.  PMT will wait for daughter to arrive.  Freddie Breech, CNS-C Palliative Medicine Team [facility] Team Phone: [phone] Pager: [phone]

## 2013-04-08 NOTE — Evaluation (Signed)
Physical Therapy Evaluation Patient Details Name: [name] MRN: [id number] DOB: Oct 10, 1935 Today's Date: 04/08/2013 Time: 7829-5621    PT Assessment / Plan / Recommendation Clinical Impression  Pt adm with fall at home and found to be orthostatic.  Needs skilled PT to maximize I and safety to allow pt to dc to least restrictive environment.  Currently pt struggling at home.  Feel pt could benefit from ST-SNF.    PT Assessment  Patient needs continued PT services    Follow Up Recommendations  SNF    Does the patient have the potential to tolerate intense rehabilitation      Barriers to Discharge Decreased caregiver support      Equipment Recommendations  None recommended by PT    Recommendations for Other Services     Frequency Min 3X/week    Precautions / Restrictions Precautions Precautions: Fall   Pertinent Vitals/Pain Generalized pain throughout.      Mobility  Bed Mobility Bed Mobility: Sit to Supine;Scooting to HOB Supine to Sit: 5: Supervision;HOB elevated Sitting - Scoot to Edge of Bed: 5: Supervision Sit to Supine: 4: Min assist;HOB elevated Scooting to [facility]: 5: Supervision Details for Bed Mobility Assistance: Pt required incr time for all movement.   Transfers Transfers: Sit to Stand;Stand to [employer] Transfers Sit to Stand: 4: Min assist;With upper extremity assist;From bed Stand to Sit: 4: Min assist;With armrests;To chair/3-in-1 Stand Pivot Transfers: 4: Min assist;With armrests    Exercises     PT Diagnosis: Difficulty walking;Generalized weakness  PT Problem List: Decreased strength;Decreased activity tolerance;Decreased balance;Decreased mobility;Pain;Cardiopulmonary status limiting activity PT Treatment Interventions: DME instruction;Gait training;Patient/family education;Functional mobility training;Therapeutic activities;Therapeutic exercise;Balance training   PT Goals Acute Rehab PT Goals PT Goal Formulation: With patient Time  For Goal Achievement: 04/15/13 Potential to Achieve Goals: Good Pt will go Supine/Side to Sit: with modified independence PT Goal: Supine/Side to Sit - Progress: Goal set today Pt will go Sit to Supine/Side: with modified independence PT Goal: Sit to Supine/Side - Progress: Goal set today Pt will go Sit to Stand: with modified independence PT Goal: Sit to Stand - Progress: Goal set today Pt will go Stand to Sit: with modified independence PT Goal: Stand to Sit - Progress: Goal set today Pt will Ambulate: 51 - 150 feet;with supervision PT Goal: Ambulate - Progress: Goal set today  Visit Information  Last PT Received On: 04/08/13 Assistance Needed: +1    Subjective Data  Subjective: Pt stated he isn't doing too well at home.   Prior Functioning  Home Living Lives With: Alone Type of Home: Mobile home Home Access: Level entry Home Layout: One level Bathroom Shower/Tub: Engineer, manufacturing systems: Standard Home Adaptive Equipment: None Additional Comments: Pt has been staying with ex-wife but isn't going back there. Prior Function Level of Independence: Independent Vocation: Retired Musician: No difficulties    Copywriter, advertising Arousal/Alertness: Awake/alert Behavior During Therapy: WFL for tasks assessed/performed Overall Cognitive Status: Within Functional Limits for tasks assessed    Extremity/Trunk Assessment Right Lower Extremity Assessment RLE ROM/Strength/Tone: Deficits RLE ROM/Strength/Tone Deficits: grossly 4/5 Left Lower Extremity Assessment LLE ROM/Strength/Tone: Deficits LLE ROM/Strength/Tone Deficits: grossly 4/5   Balance Balance Balance Assessed: Yes Static Standing Balance Static Standing - Balance Support: No upper extremity supported Static Standing - Level of Assistance: 5: Stand by assistance  End of Session PT - End of Session Activity Tolerance: Treatment limited secondary to medical complications (Comment)  (orthostatic) Patient left: in bed Nurse Communication: Mobility status;Other (comment) (orthostatic  vitals)  GP     Quavon Keisling 04/08/2013, 10:39 AM  Skip Mayer PT [phone]

## 2013-04-08 NOTE — Progress Notes (Signed)
[facility] PROGRESS NOTE  [name] ZOX:[id number] DOB: 1935-10-12 DOA: 04/07/2013 PCP: Leo Grosser, MD   Brief narrative 78 year old me in week history of status post processed cancer, COPD, CHF, diabetes, hypertension, gout, anemia admitted for failure to thrive.   Assessment/Plan: Failure to thrive Patient presented with dehydration and orthostasis in the setting of poor by mouth intake. He also has significant underlying systolic CHF. Blood pressure medications held on admission and given gentle hydration. I would not resume his blood pressure medication at this time. PT eval.  Recurrent UTIs Patient has a chronic indwelling Foley. Has history of MRSA on urine culture. Blood culture and urine culture sent on admission and started empirically on Rocephin. Patient afebrile. No leukocytosis.  CAD with cardiomyopathy EF of 25%. Not a Candidate for antiplatelets given multiple GI bleed  Type 2 diabetes Continue Januvia and sliding scale insulin  Stage IV prostate CA Patient has received palliative radiotherapy. Also receive hormonal therapy would disease progressing. Continue Flomax  CKD stage IV Renal function seems at baseline. Not candidate for dialysis  Goals of care Given multiple comorbidities and failure to thrive palliative care consult called in admission. Palliative care meeting held today in presence of the patient and his daughter. They want patient to be treated for acute illnesses, IV fluids for a defined period of time, blood products as indicated, antibiotics for infection. They do not want any formal thought difficile I support, CPR or pressors. Do not want any form of artificial nutritional support or tube feeds  Code Status:  DO NOT RESUSCITATE Family Communication: discussed with daughter at bedside Disposition Plan:  PT eval pending. Will need skilled nursing facility   Consultants:  palliative  care  Procedures:  NONE  Antibiotics:   Rocephin ( 5/11>>)  HPI/Subjective:  [name]. Daughter at bedside  Objective: Filed Vitals:   04/08/13 0940 04/08/13 0945 04/08/13 1022 04/08/13 1403  BP: 94/57 83/42 100/55 124/62  Pulse:   72 111  Temp:   97.9 F (36.6 C) 97.3 F (36.3 C)  TempSrc:   Oral Oral  Resp:   20 20  Height:      Weight:      SpO2: 98%  100% 99%    Intake/Output Summary (Last 24 hours) at 04/08/13 1652 Last data filed at 04/08/13 1427  Gross per 24 hour  Intake 3023.33 ml  Output    627 ml  Net 2396.33 ml   Filed Weights   04/07/13 1700 04/08/13 0632  Weight: 72.439 kg (159 lb 11.2 oz) 72.5 kg (159 lb 13.3 oz)    Exam:   General:  Elderly male lying in bed in no acute distress,  HEENT: No pallor, dry oral mucosa  Cardiovascular:  Normal S1 and S2, no murmurs rub or gallop  Chest: Clear to auscultation bilaterally, no added sounds  Abdomen: Soft, nontender, nondistended, bowel sounds present  Extremities: Warm, trace edema  CNS: AAOX3,    Data Reviewed: Basic Metabolic Panel:  Recent Labs Lab 04/05/13 1107 04/07/13 1339 04/08/13 0521  NA 136 132* 135  K 5.8* 4.6 4.7  CL 111 106 109  CO2 13* 18* 18*  GLUCOSE 144* 104* 100*  BUN 39* 36* 34*  CREATININE 2.97* 2.86* 2.95*  CALCIUM 9.4 8.8 8.8   Liver Function Tests:  Recent Labs Lab 04/07/13 1339  AST 18  ALT 10  ALKPHOS 283*  BILITOT 0.5  PROT 6.0  ALBUMIN 2.6*   No results found for this basename: LIPASE, AMYLASE,  in the last 168 hours No results found for this basename: AMMONIA,  in the last 168 hours CBC:  Recent Labs Lab 04/05/13 1107 04/07/13 1339 04/08/13 0521  WBC 6.0 4.7 5.1  NEUTROABS 4.0 3.1  --   HGB 9.0* 9.0* 8.4*  HCT 28.5* 26.7* 25.1*  MCV 80.5 76.9* 77.5*  PLT 408* 402* 352   Cardiac Enzymes: No results found for this basename: CKTOTAL, CKMB, CKMBINDEX, TROPONINI,  in the last 168 hours BNP (last 3 results)  Recent Labs   03/07/13 1010 03/21/13 1425  PROBNP 22646.0* 22776.00*   CBG:  Recent Labs Lab 04/08/13 0640  GLUCAP 96    Recent Results (from the past 240 hour(s))  URINE CULTURE     Status: None   Collection Time    04/07/13  2:59 PM      Result Value Range Status   Specimen Description URINE, CATHETERIZED   Final   Special Requests NONE   Final   Culture  Setup Time 04/07/2013 21:46   Final   Colony Count >=100,000 COLONIES/ML   Final   Culture     Final   Value: STAPHYLOCOCCUS AUREUS     Note: RIFAMPIN AND GENTAMICIN SHOULD NOT BE USED AS SINGLE DRUGS FOR TREATMENT OF STAPH INFECTIONS.   Report Status PENDING   Incomplete  CULTURE, BLOOD (ROUTINE X 2)     Status: None   Collection Time    04/07/13  5:31 PM      Result Value Range Status   Specimen Description BLOOD LEFT FOREARM   Final   Special Requests BOTTLES DRAWN AEROBIC ONLY 10CC   Final   Culture  Setup Time 04/07/2013 21:40   Final   Culture     Final   Value:        BLOOD CULTURE RECEIVED NO GROWTH TO DATE CULTURE WILL BE HELD FOR 5 DAYS BEFORE ISSUING A FINAL NEGATIVE REPORT   Report Status PENDING   Incomplete  CULTURE, BLOOD (ROUTINE X 2)     Status: None   Collection Time    04/07/13  5:41 PM      Result Value Range Status   Specimen Description BLOOD RIGHT WRIST   Final   Special Requests BOTTLES DRAWN AEROBIC ONLY 3CC   Final   Culture  Setup Time 04/07/2013 21:42   Final   Culture     Final   Value:        BLOOD CULTURE RECEIVED NO GROWTH TO DATE CULTURE WILL BE HELD FOR 5 DAYS BEFORE ISSUING A FINAL NEGATIVE REPORT   Report Status PENDING   Incomplete     Studies: Ct Head Wo Contrast  04/07/2013  *RADIOLOGY REPORT*  Clinical Data:  Fall, right facial trauma, dizziness.  Diffuse bony metastatic disease, prostate cancer.  CT HEAD WITHOUT CONTRAST CT MAXILLOFACIAL WITHOUT CONTRAST  Technique:  Multidetector CT imaging of the head and maxillofacial structures were performed using the standard protocol without  intravenous contrast. Multiplanar CT image reconstructions of the maxillofacial structures were also generated.  Comparison:  No similar prior study is available for comparison. Bone scan 02/05/2013 is reviewed.  CT HEAD  Findings: Examination is degraded by patient motion.  Images were repeated. Cortical volume loss noted with proportional ventricular prominence.  Periventricular white matter hypodensity likely indicates small vessel ischemic change.  No acute hemorrhage, acute infarction, or mass lesion is identified.  Remote bilateral external capsule lacunar infarcts.  Deformity of the right medial orbital wall is noted without  acute fracture line visible.  IMPRESSION: No acute intracranial finding.  Right medial orbital wall deformity, detailed assess further below.  CT MAXILLOFACIAL  Findings:   The patient is edentulous.  Mild anterior displacement of the mandibular condyle is noted which could indicate subluxation of unknown chronicity.  Medial deformity of the medial right orbital wall is noted without fracture line.  The intraconal contents are normal as well as the globes.  Paranasal sinuses otherwise unremarkable.  No fracture or acute osseous abnormality. There is mild right facial subcutaneous fat stranding and swelling. No underlying fluid collection identified.  Cervical spine degenerative changes are partly visualized. Bilateral ostiomeatal units are patent.  Material within the right external auditory canal is compatible with cerumen.  IMPRESSION: Right facial soft tissue swelling without underlying facial bone fracture.  Apparent remote deformity of the medial right orbital wall.  No acute fracture identified.   Original Report Authenticated By: Christiana Pellant, M.D.    Ct Maxillofacial Wo Cm  04/07/2013  *RADIOLOGY REPORT*  Clinical Data:  Fall, right facial trauma, dizziness.  Diffuse bony metastatic disease, prostate cancer.  CT HEAD WITHOUT CONTRAST CT MAXILLOFACIAL WITHOUT CONTRAST   Technique:  Multidetector CT imaging of the head and maxillofacial structures were performed using the standard protocol without intravenous contrast. Multiplanar CT image reconstructions of the maxillofacial structures were also generated.  Comparison:  No similar prior study is available for comparison. Bone scan 02/05/2013 is reviewed.  CT HEAD  Findings: Examination is degraded by patient motion.  Images were repeated. Cortical volume loss noted with proportional ventricular prominence.  Periventricular white matter hypodensity likely indicates small vessel ischemic change.  No acute hemorrhage, acute infarction, or mass lesion is identified.  Remote bilateral external capsule lacunar infarcts.  Deformity of the right medial orbital wall is noted without acute fracture line visible.  IMPRESSION: No acute intracranial finding.  Right medial orbital wall deformity, detailed assess further below.  CT MAXILLOFACIAL  Findings:   The patient is edentulous.  Mild anterior displacement of the mandibular condyle is noted which could indicate subluxation of unknown chronicity.  Medial deformity of the medial right orbital wall is noted without fracture line.  The intraconal contents are normal as well as the globes.  Paranasal sinuses otherwise unremarkable.  No fracture or acute osseous abnormality. There is mild right facial subcutaneous fat stranding and swelling. No underlying fluid collection identified.  Cervical spine degenerative changes are partly visualized. Bilateral ostiomeatal units are patent.  Material within the right external auditory canal is compatible with cerumen.  IMPRESSION: Right facial soft tissue swelling without underlying facial bone fracture.  Apparent remote deformity of the medial right orbital wall.  No acute fracture identified.   Original Report Authenticated By: Christiana Pellant, M.D.     Scheduled Meds: . allopurinol  200 mg Oral Daily  . bicalutamide  50 mg Oral Daily  .  cefTRIAXone (ROCEPHIN)  IV  1 g Intravenous Q24H  . enoxaparin (LOVENOX) injection  30 mg Subcutaneous Q24H  . linagliptin  5 mg Oral Daily  . simvastatin  20 mg Oral QPM  . sodium bicarbonate  325 mg Oral BID  . sodium chloride  3 mL Intravenous Q12H  . tamsulosin  0.4 mg Oral Daily   Continuous Infusions: . sodium chloride 100 mL/hr at 04/08/13 1427      Time spent: 25 minutes    Arjen Deringer  [facility] Pager [phone] If 7PM-7AM, please contact night-coverage at [email or web address], password [facility] 04/08/2013, 4:52 PM  LOS: 1 day

## 2013-04-08 NOTE — Consult Note (Signed)
[name]      DOB: 05/01/1935      VWU:[id number]     Consult Note from the Palliative Medicine Team at [facility]    Consult Requested by:Dr Lavera Guise     PCP: Leo Grosser, MD Reason for Consultation:Goals of Care    Phone Number:[phone]  Assessment of patients Current state: Patient sitting up in bed, respirations unlabored, alert, oriented and verbally responsive. Denies any presence of pain at this time  Reviewed chart, spoke with staff, proceeded to have Goals of Care conversation with patient and his daughter [name]. We discussed patients current medical issues as well as other health diagnosis (Porstate Cancer Stage 4, CKD and CHF), and educated them on current status of extent of cancer. Patient was living at his home with his ex-wife assisting in evening, patient was there most of the day alone. Patient and daughter agreed that he could not return home, and are agreeable to SNF placement, also for palliative care services to follow at SNF if available   Discussed the philosophy of palliative care and hospice support and services provided. Also engaged in decision-making with family regarding concepts of Medical Orders for Scope of Treatment pertaining to cardiac and pulmonary resuscitation, desire for acute future medical interventions for issues, the use of antibiotic therapy, intravenous hydration and continuing artificial tube feedings.  Also discussed the importance of patient delegating a person to be HPOA in the event his status would decline and someone needed to make health care decisions for him. He is agreeable to have his daughter [name] as his HPOA, social work has been asked to assist patient with this during this hospitalization.   Goals of Care/Scope of Treatment: 1.  Code Status:DNR/DNI   Continue current level of medical treatment and intervention  2. Scope of Treatment: 1. Vital Signs:routine  2. Respiratory/Oxygen: as  indicated 3. Nutritional Support/Tube Feeds: No 4. Antibiotics:yes for identified or suspected infections 5. Blood Products: as indicated 6. IVF: for defined period of time 7. Review of Medications to be discontinued: none 8. Labs: as indicated 9. Telemetry: as indicated   4. Disposition: recommend discharge to SNF with Palliative care Services to follow.   3. Symptom Management:  1. Pain: Percocet 1 tab every 4 hours as needed-patient denies pain as present 2. Edentulous: Dysphagia 3 diet  4. Psychosocial:  5. Spiritual:patient declined   Patient Documents Completed or Given: Document Given Completed  Advanced Directives Pkt    MOST  YES  DNR  YES  Gone from My Sight    Hard Choices YES     Brief HPI: Patient is 78 yo AAM with PMH: prostate cancer Stage 4 (diffuse bony metastasis),s/p radiation treatment to R humerus and L femur from 02/12/13->02/20/13, ( chronic UTI's (indwelling foley) CKD, CHF, DM, gout, and anemia. Admitted 04/07/13 s/p fall, found to be hypotensive, empirically initiated on Rocephin for UTI   ROS: +general weakness/fatigue, +hematuria, denies pain, n/v, fever    PMH:  Past Medical History  Diagnosis Date  . CAD (coronary artery disease)   . Hypertension   . Obesity   . Dyslipidemia   . Anemia   . H/O hemorrhoids   . H/O: GI bleed   . Rhinitis, allergic   . Fatty liver   . Shoulder dislocation 1970    right  . Myocardial infarction 2007  . History of gout   . Asthma   . Arthritis     "all over my whole body" (03/07/2013)  .  Chronic kidney disease (CKD), stage III (moderate)     ckd III  . Carpal tunnel syndrome   . Prostate cancer 02/11/13-02/20/13    metastatic prostate cancer  . Bone metastasis   . Fall at home 04/07/2013  . CHF (congestive heart failure)   . Type II diabetes mellitus   . Foley catheter in place     "Coude" (04/08/2013)     PSH: Past Surgical History  Procedure Laterality Date  . Transurethral resection of  prostate  06/08/2011, 11/08/06  . Nerve repair Right 1974    "hand" (03/07/2013)  . Cataract extraction w/ intraocular lens  implant, bilateral Bilateral    I have reviewed the FH and SH and  If appropriate update it with new information. Allergies  Allergen Reactions  . Aspirin Other (See Comments)    "makes my stomach hurt real bad."  . Feldene (Piroxicam) Swelling   Scheduled Meds: . allopurinol  200 mg Oral Daily  . bicalutamide  50 mg Oral Daily  . cefTRIAXone (ROCEPHIN)  IV  1 g Intravenous Q24H  . enoxaparin (LOVENOX) injection  30 mg Subcutaneous Q24H  . linagliptin  5 mg Oral Daily  . simvastatin  20 mg Oral QPM  . sodium bicarbonate  325 mg Oral BID  . sodium chloride  3 mL Intravenous Q12H  . tamsulosin  0.4 mg Oral Daily   Continuous Infusions: . sodium chloride 100 mL/hr at 04/08/13 1427   PRN Meds:.acetaminophen, acetaminophen, hydrocortisone cream, oxyCODONE-acetaminophen    BP 124/62  Pulse 111  Temp(Src) 97.3 F (36.3 C) (Oral)  Resp 20  Ht 5\' 7"  (1.702 m)  Wt 72.5 kg (159 lb 13.3 oz)  BMI 25.03 kg/m2  SpO2 99%   PPS:60-70%   Intake/Output Summary (Last 24 hours) at 04/08/13 1454 Last data filed at 04/08/13 1427  Gross per 24 hour  Intake 3023.33 ml  Output    627 ml  Net 2396.33 ml   RUE:AVWUJ to admission 5/9                      Physical Exam:  General: Alert,  HEENT: anicteric, buccal mucosa moist, edentulous, no lesions Chest:  CTA CVS: RRR Abdomen:soft, non-tender, BS audible Ext: no pedal edema Neuro:oriented x 3  Labs: CBC    Component Value Date/Time   WBC 5.1 04/08/2013 0521   WBC 7.1 04/03/2012 1413   RBC 3.24* 04/08/2013 0521   RBC 4.11* 04/03/2012 1413   HGB 8.4* 04/08/2013 0521   HGB 10.8* 04/03/2012 1413   HCT 25.1* 04/08/2013 0521   HCT 34.6* 04/03/2012 1413   PLT 352 04/08/2013 0521   PLT 278 04/03/2012 1413   MCV 77.5* 04/08/2013 0521   MCV 84.2 04/03/2012 1413   MCH 25.9* 04/08/2013 0521   MCH 26.3* 04/03/2012 1413   MCHC  33.5 04/08/2013 0521   MCHC 31.2* 04/03/2012 1413   RDW 18.1* 04/08/2013 0521   RDW 16.3* 04/03/2012 1413   LYMPHSABS 1.0 04/07/2013 1339   LYMPHSABS 1.8 04/03/2012 1413   MONOABS 0.3 04/07/2013 1339   MONOABS 0.5 04/03/2012 1413   EOSABS 0.3 04/07/2013 1339   EOSABS 0.3 04/03/2012 1413   BASOSABS 0.0 04/07/2013 1339   BASOSABS 0.1 04/03/2012 1413    BMET    Component Value Date/Time   NA 135 04/08/2013 0521   K 4.7 04/08/2013 0521   CL 109 04/08/2013 0521   CO2 18* 04/08/2013 0521   GLUCOSE 100* 04/08/2013 0521   BUN  34* 04/08/2013 0521   CREATININE 2.95* 04/08/2013 0521   CREATININE 2.97* 04/05/2013 1107   CALCIUM 8.8 04/08/2013 0521   GFRNONAA 19* 04/08/2013 0521   GFRAA 22* 04/08/2013 0521    CMP     Component Value Date/Time   NA 135 04/08/2013 0521   K 4.7 04/08/2013 0521   CL 109 04/08/2013 0521   CO2 18* 04/08/2013 0521   GLUCOSE 100* 04/08/2013 0521   BUN 34* 04/08/2013 0521   CREATININE 2.95* 04/08/2013 0521   CREATININE 2.97* 04/05/2013 1107   CALCIUM 8.8 04/08/2013 0521   PROT 6.0 04/07/2013 1339   ALBUMIN 2.6* 04/07/2013 1339   AST 18 04/07/2013 1339   ALT 10 04/07/2013 1339   ALKPHOS 283* 04/07/2013 1339   BILITOT 0.5 04/07/2013 1339   GFRNONAA 19* 04/08/2013 0521   GFRAA 22* 04/08/2013 0521    Time In Time Out Total Time Spent with Patient Total Overall Time  1:00p 2:30p 90 min 100 min    Greater than 50%  of this time was spent counseling and coordinating care related to the above assessment and plan.  Freddie Breech, CNS-C Palliative Medicine Team [facility] Team Phone: [phone] Pager: [phone]

## 2013-04-09 DIAGNOSIS — N184 Chronic kidney disease, stage 4 (severe): Secondary | ICD-10-CM

## 2013-04-09 LAB — URINE CULTURE

## 2013-04-09 MED ORDER — SODIUM BICARBONATE 650 MG PO TABS
650.0000 mg | ORAL_TABLET | Freq: Two times a day (BID) | ORAL | Status: DC
  Administered 2013-04-09 – 2013-04-11 (×4): 650 mg via ORAL
  Filled 2013-04-09 (×5): qty 1

## 2013-04-09 NOTE — Progress Notes (Signed)
OT  Note  Patient Details Name: [name] MRN: [id number] DOB: [birth year]   Cancelled Treatment:    Reason Eval/Treat Not Completed: Other (comment) (Noted plans for SNF and palliative care.Defer OT eval to SNF)  Shaquanda Graves, [place] 04/09/2013, 1:58 PM

## 2013-04-09 NOTE — Progress Notes (Signed)
INITIAL NUTRITION ASSESSMENT  DOCUMENTATION CODES Per approved criteria  -Not Applicable   INTERVENTION: 1. Likely with some level of malnutrition given severe weight loss, 20% body weight in the past year. Pt declined to talk about nutrition hx, unable to dx at this time.  2. Pt declined nutrition supplements, though he would likely benefit from them 3. Recommend change diet to regular to promote better oral intake   NUTRITION DIAGNOSIS: Inadequate oral intake related to increased needs and poor appetite as evidenced by weight loss.   Goal: PO intake to meet >/=75% estimated nutrition needs.   Monitor:  PO intake, weight trends, labs  Reason for Assessment: Malnutrition Screening Tool  78 y.o. male  Admitting Dx: Weakness   ASSESSMENT: Pt with hx of stage IV prostate cancer, CKD, CHF, DM, and HTN. Pt was brought to the ED after a fall at home. Pt with hypotension likely related to poor oral intake and polypharmacy.  Pt reported unintentional weight loss and poor appetite. Per hx, pt with 41 lb weight loss in less then one year, 20% body weight. Pt declines any nutrition supplements. When attempting to talk about nutrition pt declined to talk with RD.  Notes indicate that pt has very little to eat or drink at home.  Expect some level of malnutrition.  Pt has been seen by PMT. Pt does not want TF/TPN.   Height: Ht Readings from Last 1 Encounters:  04/07/13 5\' 7"  (1.702 m)    Weight: Wt Readings from Last 1 Encounters:  04/09/13 162 lb 0.6 oz (73.5 kg)    Ideal Body Weight: 148 lbs   % Ideal Body Weight: 109%  Wt Readings from Last 10 Encounters:  04/09/13 162 lb 0.6 oz (73.5 kg)  04/05/13 161 lb (73.029 kg)  03/21/13 164 lb (74.39 kg)  03/08/13 177 lb (80.287 kg)  02/28/13 195 lb (88.451 kg)  02/22/13 195 lb (88.451 kg)  02/15/13 195 lb (88.451 kg)  02/03/13 197 lb (89.359 kg)  08/02/12 197 lb (89.359 kg)  05/25/12 203 lb (92.08 kg)    Usual Body Weight: 203  lbs   % Usual Body Weight: 80%  BMI:  Body mass index is 25.37 kg/(m^2). Overweight   Estimated Nutritional Needs: Kcal: 2200-2400 Protein: 88-100 gm  Fluid: 2.2-2.4 L   Skin: intact  Diet Order: Cardiac  EDUCATION NEEDS: -No education needs identified at this time   Intake/Output Summary (Last 24 hours) at 04/09/13 1139 Last data filed at 04/09/13 1009  Gross per 24 hour  Intake   4860 ml  Output   2077 ml  Net   2783 ml    Last BM: PTA   Labs:   Recent Labs Lab 04/05/13 1107 04/07/13 1339 04/08/13 0521  NA 136 132* 135  K 5.8* 4.6 4.7  CL 111 106 109  CO2 13* 18* 18*  BUN 39* 36* 34*  CREATININE 2.97* 2.86* 2.95*  CALCIUM 9.4 8.8 8.8  GLUCOSE 144* 104* 100*    CBG (last 3)   Recent Labs  04/08/13 0640  GLUCAP 96    Scheduled Meds: . allopurinol  200 mg Oral Daily  . bicalutamide  50 mg Oral Daily  . cefTRIAXone (ROCEPHIN)  IV  1 g Intravenous Q24H  . enoxaparin (LOVENOX) injection  30 mg Subcutaneous Q24H  . linagliptin  5 mg Oral Daily  . simvastatin  20 mg Oral QPM  . sodium bicarbonate  325 mg Oral BID  . sodium chloride  3 mL Intravenous  Q12H  . tamsulosin  0.4 mg Oral Daily    Continuous Infusions: . sodium chloride 100 mL/hr (04/09/13 0126)    Past Medical History  Diagnosis Date  . CAD (coronary artery disease)   . Hypertension   . Obesity   . Dyslipidemia   . Anemia   . H/O hemorrhoids   . H/O: GI bleed   . Rhinitis, allergic   . Fatty liver   . Shoulder dislocation 1970    right  . Myocardial infarction 2007  . History of gout   . Asthma   . Arthritis     "all over my whole body" (03/07/2013)  . Chronic kidney disease (CKD), stage III (moderate)     ckd III  . Carpal tunnel syndrome   . Prostate cancer 02/11/13-02/20/13    metastatic prostate cancer  . Bone metastasis   . Fall at home 04/07/2013  . CHF (congestive heart failure)   . Type II diabetes mellitus   . Foley catheter in place     "Coude" (04/08/2013)     Past Surgical History  Procedure Laterality Date  . Transurethral resection of prostate  06/08/2011, 11/08/06  . Nerve repair Right 1974    "hand" (03/07/2013)  . Cataract extraction w/ intraocular lens  implant, bilateral Bilateral     Clarene Duke RD, LDN Pager [phone] After Hours pager [phone]

## 2013-04-09 NOTE — Progress Notes (Signed)
[facility] PROGRESS NOTE  Aryn Kops UJW:[id number] DOB: 08-Aug-1935 DOA: 04/07/2013 PCP: Leo Grosser, MD  Brief narrative  78 year old me in week history of status post processed cancer, COPD, CHF, diabetes, hypertension, gout, anemia admitted for failure to thrive.    Assessment/Plan:  Failure to thrive  Patient presented with dehydration and orthostasis in the setting of poor by mouth intake. He also has significant underlying systolic CHF.  Blood pressure medications held on admission and given gentle hydration.  - not resume his blood pressure medication at this time.  PT eval recommends SNF  Recurrent UTIs  Patient has a chronic indwelling Foley. Has history of MRSA on urine culture. Staph aureus again noted on urine cx. started empirically on Rocephin. Patient afebrile. No leukocytosis. May be chronic colonization. Will d/c abx  CAD with cardiomyopathy  EF of 25%.  Not a Candidate for antiplatelets given multiple GI bleed . Currently dehydrated.   Type 2 diabetes  Continue Januvia and sliding scale insulin   Stage IV prostate CA  Patient has received palliative radiotherapy. Also receive hormonal therapy would disease progressing.  Continue Flomax . Has chronic indwelling foley and informs having appt with Dr Janifer Adie ( urology) tomorrow. i have called the office to notify Dr Janifer Adie.   CKD stage IV  Renal function seems at baseline. Not candidate for dialysis   Goals of care  Given multiple comorbidities and failure to thrive palliative care consult called in admission. Palliative care meeting held on 5/12  in presence of the patient and his daughter. They want patient to be treated for acute illnesses, IV fluids for a defined period of time, blood products as indicated, antibiotics for infection. They do not want any formal thought difficile I support, CPR or pressors. Do not want any form of artificial nutritional support or tube feeds   Code Status:  DO NOT RESUSCITATE  Family Communication: discussed with daughter on 5/12 Disposition Plan: . Will need skilled nursing facility with palliative care follow up  Consultants:  palliative care  Procedures:  NONE   Antibiotics:  Rocephin ( 5/11>>)   HPI/Subjective: No overnight issues  Objective: Filed Vitals:   04/08/13 2135 04/08/13 2230 04/09/13 0614 04/09/13 1341  BP: 139/102 132/78 122/67 116/62  Pulse: 86  91 99  Temp: 98.5 F (36.9 C)  97.9 F (36.6 C) 98.2 F (36.8 C)  TempSrc: Oral  Oral Oral  Resp: 20  20 18   Height:      Weight:   73.5 kg (162 lb 0.6 oz)   SpO2: 100%  100% 100%    Intake/Output Summary (Last 24 hours) at 04/09/13 1552 Last data filed at 04/09/13 1345  Gross per 24 hour  Intake   2925 ml  Output   2527 ml  Net    398 ml   Filed Weights   04/07/13 1700 04/08/13 0632 04/09/13 0614  Weight: 72.439 kg (159 lb 11.2 oz) 72.5 kg (159 lb 13.3 oz) 73.5 kg (162 lb 0.6 oz)    Exam:  General: Elderly male lying in bed in no acute distress HEENT: No pallor, dry oral mucosa  Cardiovascular: Normal S1 and S2, no murmurs rub or gallop  Chest: Clear to auscultation bilaterally, no added sounds  Abdomen: Soft, nontender, nondistended, bowel sounds present, chronic foley in place  Extremities: Warm, trace edema  CNS: AAOX3,   Data Reviewed: Basic Metabolic Panel:  Recent Labs Lab 04/05/13 1107 04/07/13 1339 04/08/13 0521  NA 136 132* 135  K 5.8*  4.6 4.7  CL 111 106 109  CO2 13* 18* 18*  GLUCOSE 144* 104* 100*  BUN 39* 36* 34*  CREATININE 2.97* 2.86* 2.95*  CALCIUM 9.4 8.8 8.8   Liver Function Tests:  Recent Labs Lab 04/07/13 1339  AST 18  ALT 10  ALKPHOS 283*  BILITOT 0.5  PROT 6.0  ALBUMIN 2.6*   No results found for this basename: LIPASE, AMYLASE,  in the last 168 hours No results found for this basename: AMMONIA,  in the last 168 hours CBC:  Recent Labs Lab 04/05/13 1107 04/07/13 1339 04/08/13 0521  WBC 6.0 4.7 5.1   NEUTROABS 4.0 3.1  --   HGB 9.0* 9.0* 8.4*  HCT 28.5* 26.7* 25.1*  MCV 80.5 76.9* 77.5*  PLT 408* 402* 352   Cardiac Enzymes: No results found for this basename: CKTOTAL, CKMB, CKMBINDEX, TROPONINI,  in the last 168 hours BNP (last 3 results)  Recent Labs  03/07/13 1010 03/21/13 1425  PROBNP 22646.0* 22776.00*   CBG:  Recent Labs Lab 04/08/13 0640  GLUCAP 96    Recent Results (from the past 240 hour(s))  URINE CULTURE     Status: None   Collection Time    04/07/13  2:59 PM      Result Value Range Status   Specimen Description URINE, CATHETERIZED   Final   Special Requests NONE   Final   Culture  Setup Time 04/07/2013 21:46   Final   Colony Count >=100,000 COLONIES/ML   Final   Culture     Final   Value: STAPHYLOCOCCUS AUREUS     Note: RIFAMPIN AND GENTAMICIN SHOULD NOT BE USED AS SINGLE DRUGS FOR TREATMENT OF STAPH INFECTIONS.   Report Status 04/09/2013 FINAL   Final   Organism ID, Bacteria STAPHYLOCOCCUS AUREUS   Final  CULTURE, BLOOD (ROUTINE X 2)     Status: None   Collection Time    04/07/13  5:31 PM      Result Value Range Status   Specimen Description BLOOD LEFT FOREARM   Final   Special Requests BOTTLES DRAWN AEROBIC ONLY 10CC   Final   Culture  Setup Time 04/07/2013 21:40   Final   Culture     Final   Value:        BLOOD CULTURE RECEIVED NO GROWTH TO DATE CULTURE WILL BE HELD FOR 5 DAYS BEFORE ISSUING A FINAL NEGATIVE REPORT   Report Status PENDING   Incomplete  CULTURE, BLOOD (ROUTINE X 2)     Status: None   Collection Time    04/07/13  5:41 PM      Result Value Range Status   Specimen Description BLOOD RIGHT WRIST   Final   Special Requests BOTTLES DRAWN AEROBIC ONLY 3CC   Final   Culture  Setup Time 04/07/2013 21:42   Final   Culture     Final   Value:        BLOOD CULTURE RECEIVED NO GROWTH TO DATE CULTURE WILL BE HELD FOR 5 DAYS BEFORE ISSUING A FINAL NEGATIVE REPORT   Report Status PENDING   Incomplete     Studies: No results  found.  Scheduled Meds: . allopurinol  200 mg Oral Daily  . bicalutamide  50 mg Oral Daily  . cefTRIAXone (ROCEPHIN)  IV  1 g Intravenous Q24H  . enoxaparin (LOVENOX) injection  30 mg Subcutaneous Q24H  . linagliptin  5 mg Oral Daily  . simvastatin  20 mg Oral QPM  . sodium bicarbonate  325 mg Oral BID  . sodium chloride  3 mL Intravenous Q12H  . tamsulosin  0.4 mg Oral Daily   Continuous Infusions:     Time spent: 25 minutes    Palmer Shorey  [facility] Pager [phone] If 7PM-7AM, please contact night-coverage at [email or web address], password [facility] 04/09/2013, 3:52 PM  LOS: 2 days

## 2013-04-09 NOTE — Progress Notes (Signed)
Pt had some runs of wide QRS as notified by monitor desk.  Pt asymptomatic.  BP 108/52, P 79.  Dr. Gonzella Lex notified.  Will continue to monitor.  Amanda Pea, Charity fundraiser.

## 2013-04-10 LAB — BASIC METABOLIC PANEL
BUN: 22 mg/dL (ref 6–23)
Chloride: 110 mEq/L (ref 96–112)
GFR calc Af Amer: 31 mL/min — ABNORMAL LOW (ref >90)
Potassium: 5.2 mEq/L — ABNORMAL HIGH (ref 3.5–5.1)
Sodium: 136 mEq/L (ref 135–145)

## 2013-04-10 LAB — CBC
HCT: 25.5 % — ABNORMAL LOW (ref 39.0–52.0)
RDW: 18.1 % — ABNORMAL HIGH (ref 11.5–15.5)
WBC: 6 10*3/uL (ref 4.0–10.5)

## 2013-04-10 MED ORDER — DOXYCYCLINE HYCLATE 100 MG IV SOLR
100.0000 mg | Freq: Two times a day (BID) | INTRAVENOUS | Status: DC
  Administered 2013-04-10 – 2013-04-11 (×3): 100 mg via INTRAVENOUS
  Filled 2013-04-10 (×4): qty 100

## 2013-04-10 NOTE — Progress Notes (Addendum)
[facility] PROGRESS NOTE  [name] ZOX:[id number] DOB: March 15, 1935 DOA: 04/07/2013 PCP: Leo Grosser, MD  Assessment/Plan: Active Problems:   Essential hypertension, benign   Hypertension   H/O: gout   UTI (lower urinary tract infection)   Diabetes mellitus   Hydronephrosis, bilateral   Prostate cancer metastatic to bone   Metabolic acidosis   Obstructive uropathy   Metastatic prostate cancer to bone.    Anemia of chronic disease   Weakness generalized   Chronic systolic CHF (congestive heart failure)   CKD (chronic kidney disease) stage 4, GFR 15-29 ml/min   Myocardial infarction   H/O: GI bleed   Palliative care encounter   Pain    **Brief narrative  78 year old me in week history of status post processed cancer, COPD, CHF, diabetes, hypertension, gout, anemia admitted for failure to thrive.   Assessment/Plan:  Failure to thrive  Patient presented with dehydration and orthostasis in the setting of poor by mouth intake. He also has significant underlying systolic CHF.  Blood pressure medications held on admission and given gentle hydration.  - not resume his blood pressure medication at this time.  PT eval recommends SNF   Hematuria Patient states that this is chronic for him He sees Dr Huston Foley will place a Foley catheter month and a half ago Patient on Casodex for metastatic prostate cancer Outpatient neurology followup Will need to start bladder irrigation if it worsens DC Lovenox Follow CBC   Recurrent UTIs  Patient has a chronic indwelling Foley. Has history of MRSA on urine culture. Staph aureus again noted on urine cx. started empirically on Rocephin. Patient afebrile. No leukocytosis. May be chronic colonization. In the setting of active hematuria, will start the patient on doxycycline for MRSA in the urine   CAD with cardiomyopathy  EF of 25%.  Not a Candidate for antiplatelets given multiple GI bleed . Currently dehydrated.   Type 2  diabetes  Continue Januvia and sliding scale insulin  Stage IV prostate CA  Patient has received palliative radiotherapy. Also receive hormonal therapy would disease progressing.  Continue Flomax . Has chronic indwelling foley and informs having appt with Dr Janifer Adie ( urology) tomorrow. i have called the office to notify Dr Janifer Adie.  CKD stage IV  Renal function seems at baseline. Not candidate for dialysis    Goals of care  Given multiple comorbidities and failure to thrive palliative care consult called in admission. Palliative care meeting held on 5/12 in presence of the patient and his daughter. They want patient to be treated for acute illnesses, IV fluids for a defined period of time, blood products as indicated, antibiotics for infection. They do not want any formal thought difficile I support, CPR or pressors. Do not want any form of artificial nutritional support or tube feeds   Code Status: DO NOT RESUSCITATE  Family Communication: discussed with daughter on 5/12  Disposition Plan: . Will need skilled nursing facility with palliative care follow up  Consultants:  palliative care  Procedures:  NONE Antibiotics:  Rocephin ( 5/11>>)  HPI/Subjective: Hematuria overnight   Objective: Filed Vitals:   04/09/13 0614 04/09/13 1341 04/09/13 2008 04/10/13 0550  BP: 122/67 116/62 109/70 119/70  Pulse: 91 99 94 85  Temp: 97.9 F (36.6 C) 98.2 F (36.8 C) 98 F (36.7 C) 98 F (36.7 C)  TempSrc: Oral Oral Oral Oral  Resp: 20 18 18 20   Height:      Weight: 73.5 kg (162 lb 0.6 oz)   74.3 kg (  163 lb 12.8 oz)  SpO2: 100% 100% 98% 100%    Intake/Output Summary (Last 24 hours) at 04/10/13 1210 Last data filed at 04/10/13 0900  Gross per 24 hour  Intake   1010 ml  Output   1002 ml  Net      8 ml    Exam:  General: Elderly male lying in bed in no acute distress  HEENT: No pallor, dry oral mucosa  Cardiovascular: Normal S1 and S2, no murmurs rub or gallop  Chest: Clear to  auscultation bilaterally, no added sounds  Abdomen: Soft, nontender, nondistended, bowel sounds present, chronic foley in place  Extremities: Warm, trace edema  CNS: AAOX3    Data Reviewed: Basic Metabolic Panel:  Recent Labs Lab 04/05/13 1107 04/07/13 1339 04/08/13 0521 04/10/13 0540  NA 136 132* 135 136  K 5.8* 4.6 4.7 5.2*  CL 111 106 109 110  CO2 13* 18* 18* 20  GLUCOSE 144* 104* 100* 95  BUN 39* 36* 34* 22  CREATININE 2.97* 2.86* 2.95* 2.27*  CALCIUM 9.4 8.8 8.8 9.0    Liver Function Tests:  Recent Labs Lab 04/07/13 1339  AST 18  ALT 10  ALKPHOS 283*  BILITOT 0.5  PROT 6.0  ALBUMIN 2.6*   No results found for this basename: LIPASE, AMYLASE,  in the last 168 hours No results found for this basename: AMMONIA,  in the last 168 hours  CBC:  Recent Labs Lab 04/05/13 1107 04/07/13 1339 04/08/13 0521 04/10/13 0540  WBC 6.0 4.7 5.1 6.0  NEUTROABS 4.0 3.1  --   --   HGB 9.0* 9.0* 8.4* 8.4*  HCT 28.5* 26.7* 25.1* 25.5*  MCV 80.5 76.9* 77.5* 78.2  PLT 408* 402* 352 349    Cardiac Enzymes: No results found for this basename: CKTOTAL, CKMB, CKMBINDEX, TROPONINI,  in the last 168 hours BNP (last 3 results)  Recent Labs  03/07/13 1010 03/21/13 1425  PROBNP 22646.0* 22776.00*     CBG:  Recent Labs Lab 04/08/13 0640  GLUCAP 96    Recent Results (from the past 240 hour(s))  URINE CULTURE     Status: None   Collection Time    04/05/13 11:23 AM      Result Value Range Status   Preliminary Report Culture reincubated for better growth   Preliminary  URINE CULTURE     Status: None   Collection Time    04/07/13  2:59 PM      Result Value Range Status   Specimen Description URINE, CATHETERIZED   Final   Special Requests NONE   Final   Culture  Setup Time 04/07/2013 21:46   Final   Colony Count >=100,000 COLONIES/ML   Final   Culture     Final   Value: STAPHYLOCOCCUS AUREUS     Note: RIFAMPIN AND GENTAMICIN SHOULD NOT BE USED AS SINGLE DRUGS FOR  TREATMENT OF STAPH INFECTIONS.   Report Status 04/09/2013 FINAL   Final   Organism ID, Bacteria STAPHYLOCOCCUS AUREUS   Final  CULTURE, BLOOD (ROUTINE X 2)     Status: None   Collection Time    04/07/13  5:31 PM      Result Value Range Status   Specimen Description BLOOD LEFT FOREARM   Final   Special Requests BOTTLES DRAWN AEROBIC ONLY 10CC   Final   Culture  Setup Time 04/07/2013 21:40   Final   Culture     Final   Value:  BLOOD CULTURE RECEIVED NO GROWTH TO DATE CULTURE WILL BE HELD FOR 5 DAYS BEFORE ISSUING A FINAL NEGATIVE REPORT   Report Status PENDING   Incomplete  CULTURE, BLOOD (ROUTINE X 2)     Status: None   Collection Time    04/07/13  5:41 PM      Result Value Range Status   Specimen Description BLOOD RIGHT WRIST   Final   Special Requests BOTTLES DRAWN AEROBIC ONLY 3CC   Final   Culture  Setup Time 04/07/2013 21:42   Final   Culture     Final   Value:        BLOOD CULTURE RECEIVED NO GROWTH TO DATE CULTURE WILL BE HELD FOR 5 DAYS BEFORE ISSUING A FINAL NEGATIVE REPORT   Report Status PENDING   Incomplete     Studies: Dg Lumbar Spine Complete  03/21/2013   *RADIOLOGY REPORT*  Clinical Data: Back pain  LUMBAR SPINE - COMPLETE 4+ VIEW  Comparison: 02/03/2013  Findings: Multifocal sclerotic foci throughout the visualized bone. Increased L2 superior endplate sclerosis and irregularity, may reflect mild compression deformity.  No retropulsion. Atherosclerotic vascular calcifications.  Minimal anterolisthesis of L4 on L5.  IMPRESSION: Sclerotic foci scattered throughout the visualized bones, in keeping with metastatic disease.  Sclerosis/mild irregularity at the L2 vertebral body superior endplate may reflect mild compression deformity.  No retropulsion.   Original Report Authenticated By: Jearld Lesch, M.D.   Ct Head Wo Contrast  04/07/2013   *RADIOLOGY REPORT*  Clinical Data:  Fall, right facial trauma, dizziness.  Diffuse bony metastatic disease, prostate cancer.  CT  HEAD WITHOUT CONTRAST CT MAXILLOFACIAL WITHOUT CONTRAST  Technique:  Multidetector CT imaging of the head and maxillofacial structures were performed using the standard protocol without intravenous contrast. Multiplanar CT image reconstructions of the maxillofacial structures were also generated.  Comparison:  No similar prior study is available for comparison. Bone scan 02/05/2013 is reviewed.  CT HEAD  Findings: Examination is degraded by patient motion.  Images were repeated. Cortical volume loss noted with proportional ventricular prominence.  Periventricular white matter hypodensity likely indicates small vessel ischemic change.  No acute hemorrhage, acute infarction, or mass lesion is identified.  Remote bilateral external capsule lacunar infarcts.  Deformity of the right medial orbital wall is noted without acute fracture line visible.  IMPRESSION: No acute intracranial finding.  Right medial orbital wall deformity, detailed assess further below.  CT MAXILLOFACIAL  Findings:   The patient is edentulous.  Mild anterior displacement of the mandibular condyle is noted which could indicate subluxation of unknown chronicity.  Medial deformity of the medial right orbital wall is noted without fracture line.  The intraconal contents are normal as well as the globes.  Paranasal sinuses otherwise unremarkable.  No fracture or acute osseous abnormality. There is mild right facial subcutaneous fat stranding and swelling. No underlying fluid collection identified.  Cervical spine degenerative changes are partly visualized. Bilateral ostiomeatal units are patent.  Material within the right external auditory canal is compatible with cerumen.  IMPRESSION: Right facial soft tissue swelling without underlying facial bone fracture.  Apparent remote deformity of the medial right orbital wall.  No acute fracture identified.   Original Report Authenticated By: Christiana Pellant, M.D.   Ct Maxillofacial Wo Cm  04/07/2013    *RADIOLOGY REPORT*  Clinical Data:  Fall, right facial trauma, dizziness.  Diffuse bony metastatic disease, prostate cancer.  CT HEAD WITHOUT CONTRAST CT MAXILLOFACIAL WITHOUT CONTRAST  Technique:  Multidetector CT imaging of  the head and maxillofacial structures were performed using the standard protocol without intravenous contrast. Multiplanar CT image reconstructions of the maxillofacial structures were also generated.  Comparison:  No similar prior study is available for comparison. Bone scan 02/05/2013 is reviewed.  CT HEAD  Findings: Examination is degraded by patient motion.  Images were repeated. Cortical volume loss noted with proportional ventricular prominence.  Periventricular white matter hypodensity likely indicates small vessel ischemic change.  No acute hemorrhage, acute infarction, or mass lesion is identified.  Remote bilateral external capsule lacunar infarcts.  Deformity of the right medial orbital wall is noted without acute fracture line visible.  IMPRESSION: No acute intracranial finding.  Right medial orbital wall deformity, detailed assess further below.  CT MAXILLOFACIAL  Findings:   The patient is edentulous.  Mild anterior displacement of the mandibular condyle is noted which could indicate subluxation of unknown chronicity.  Medial deformity of the medial right orbital wall is noted without fracture line.  The intraconal contents are normal as well as the globes.  Paranasal sinuses otherwise unremarkable.  No fracture or acute osseous abnormality. There is mild right facial subcutaneous fat stranding and swelling. No underlying fluid collection identified.  Cervical spine degenerative changes are partly visualized. Bilateral ostiomeatal units are patent.  Material within the right external auditory canal is compatible with cerumen.  IMPRESSION: Right facial soft tissue swelling without underlying facial bone fracture.  Apparent remote deformity of the medial right orbital wall.  No  acute fracture identified.   Original Report Authenticated By: Christiana Pellant, M.D.    Scheduled Meds: . allopurinol  200 mg Oral Daily  . bicalutamide  50 mg Oral Daily  . linagliptin  5 mg Oral Daily  . simvastatin  20 mg Oral QPM  . sodium bicarbonate  650 mg Oral BID  . sodium chloride  3 mL Intravenous Q12H  . tamsulosin  0.4 mg Oral Daily   Continuous Infusions:   Active Problems:   Essential hypertension, benign   Hypertension   H/O: gout   UTI (lower urinary tract infection)   Diabetes mellitus   Hydronephrosis, bilateral   Prostate cancer metastatic to bone   Metabolic acidosis   Obstructive uropathy   Metastatic prostate cancer to bone.    Anemia of chronic disease   Weakness generalized   Chronic systolic CHF (congestive heart failure)   CKD (chronic kidney disease) stage 4, GFR 15-29 ml/min   Myocardial infarction   H/O: GI bleed   Palliative care encounter   Pain    Time spent: 40 minutes   [facility]  [facility] Pager [phone]. If 8PM-8AM, please contact night-coverage at [email or web address], password [facility] 04/10/2013, 12:10 PM  LOS: 3 days

## 2013-04-10 NOTE — Progress Notes (Signed)
Physical Therapy Treatment Patient Details Name: [name] MRN: [id number] DOB: [birth year] Today's Date: 04/10/2013 Time:  -     PT Assessment / Plan / Recommendation Comments on Treatment Session  Pt agreeable to sit EOB but adamantly refusing ambulation & became agitated with encouragement.      Follow Up Recommendations  SNF     Does the patient have the potential to tolerate intense rehabilitation     Barriers to Discharge        Equipment Recommendations  None recommended by PT    Recommendations for Other Services    Frequency Min 3X/week   Plan Discharge plan remains appropriate    Precautions / Restrictions Precautions Precautions: Fall Restrictions Weight Bearing Restrictions: No       Mobility  Bed Mobility Bed Mobility: Supine to Sit;Sitting - Scoot to Edge of Bed;Sit to Supine Supine to Sit: 6: Modified independent (Device/Increase time);HOB flat Sitting - Scoot to Edge of Bed: 6: Modified independent (Device/Increase time) Sit to Supine: 6: Modified independent (Device/Increase time) Transfers Transfers: Sit to Stand;Stand to Sit Sit to Stand: 4: Min guard;With upper extremity assist;From bed Stand to Sit: 4: Min guard;With upper extremity assist;To bed Details for Transfer Assistance: Pt not agreeable to stand when asked to do so but only did so to readjust his gown.   Ambulation/Gait Ambulation/Gait Assistance: Not tested (comment) General Gait Details: Pt refusing Stairs: No Wheelchair Mobility Wheelchair Mobility: No      PT Goals Acute Rehab PT Goals Time For Goal Achievement: 04/15/13 Potential to Achieve Goals: Good Pt will go Supine/Side to Sit: with modified independence PT Goal: Supine/Side to Sit - Progress: Met Pt will go Sit to Supine/Side: with modified independence PT Goal: Sit to Supine/Side - Progress: Met Pt will go Sit to Stand: with modified independence PT Goal: Sit to Stand - Progress: Progressing toward goal Pt will  go Stand to Sit: with modified independence PT Goal: Stand to Sit - Progress: Progressing toward goal Pt will Ambulate: 51 - 150 feet;with supervision  Visit Information  Last PT Received On: 04/10/13 Assistance Needed: +1    Subjective Data      Cognition  Cognition Arousal/Alertness: Awake/alert Behavior During Therapy: Agitated (becomes agitated quickly) Overall Cognitive Status: Within Functional Limits for tasks assessed    Balance     End of Session PT - End of Session Activity Tolerance:  (pt becoming agitated & laying back down in bed.  ) Patient left: in bed;with call bell/phone within reach     [place], [place] [phone] 04/10/2013

## 2013-04-10 NOTE — Clinical Documentation Improvement (Signed)
Clinical Documentation Improvement Program Query  CLINICAL DOCUMENTATION QUERIES ARE NOT PART OF THE PERMANENT MEDICAL RECORD         04/10/13  Dr. Susie Cassette,  In an effort to better capture your patient's severity of illness, reflect appropriate length of stay and utilization of resources, a review of the patient medical record has revealed the following information:   - Patient admitted with chronic indwelling foley catheter.   - Urine Culture this admission positive for > 100,000 colonies/ml Staphylococcus Aureus   - Patient originally on Rocephin IV that was discontinued 04/09/13.   - Patient developed hematuria, and Doxycycline 100mg  IV q 12 hours    Please clarify and document in the progress notes and discharge summary if the patient's staph aureus UTI:  is related to the chronic indwelling foley catheter.  OR  is not related to the chronic indwelling foley catheter.    Please use your independent clinical judgment in responding to queries.  The fact queries are asked, does not imply that any particular answer is desired or expected.   "staph aureus UTI: related to the chronic indwelling foley catheter." documented in dc summary by dr. Susie Cassette.   Mathis Dad RN 04/24/13.   Thank You,   Jerral Ralph  RN BSN CCDS Certified Clinical Documentation Specialist: Cell   [phone]  Health Information Management [place]   TO RESPOND TO THE THIS QUERY, FOLLOW THE INSTRUCTIONS BELOW:  1. If needed, update documentation for the patient's encounter via the notes activity.  2. Access this query again and click edit on the In [employer].  3. After updating, or not, click F2 to complete all highlighted (required) fields concerning your review. Select "additional documentation in the medical record" OR "no additional documentation provided".  4. Click Sign note button.  5. The deficiency will fall out of your In Basket *Please let us know if you are not able to  complete this workflow by phone or e-mail (listed below).

## 2013-04-11 ENCOUNTER — Inpatient Hospital Stay (HOSPITAL_COMMUNITY): Payer: Medicare Other

## 2013-04-11 LAB — CBC
HCT: 27.8 % — ABNORMAL LOW (ref 39.0–52.0)
Hemoglobin: 9.3 g/dL — ABNORMAL LOW (ref 13.0–17.0)
MCHC: 33.5 g/dL (ref 30.0–36.0)
RBC: 3.54 MIL/uL — ABNORMAL LOW (ref 4.22–5.81)
WBC: 6.3 10*3/uL (ref 4.0–10.5)

## 2013-04-11 LAB — URINE CULTURE: Colony Count: 70000

## 2013-04-11 MED ORDER — DOXYCYCLINE HYCLATE 50 MG PO CAPS: 50.0000 mg | ORAL_CAPSULE | Freq: Two times a day (BID) | ORAL | Status: AC

## 2013-04-11 MED ORDER — MORPHINE SULFATE 2 MG/ML IJ SOLN
1.0000 mg | INTRAMUSCULAR | Status: DC | PRN
  Administered 2013-04-11: 1 mg via INTRAVENOUS
  Filled 2013-04-11: qty 1

## 2013-04-11 MED ORDER — FUROSEMIDE 40 MG PO TABS: 40.0000 mg | ORAL_TABLET | Freq: Every day | ORAL | Status: DC

## 2013-04-11 MED ORDER — ONDANSETRON HCL 4 MG/2ML IJ SOLN
4.0000 mg | Freq: Four times a day (QID) | INTRAMUSCULAR | Status: DC | PRN
  Administered 2013-04-11: 4 mg via INTRAVENOUS
  Filled 2013-04-11: qty 2

## 2013-04-11 NOTE — Progress Notes (Signed)
Pt complains of abdominal aching intermittent pain on all 4 quadrants with PS 10/10, vomited with small gastric secretions, had episodes of extreme tachycardia of 120'-130's. No chest pains, headaches and SOB. Pt stated " I feel so sick". Donnamarie Poag notified and ordered for Morphine, Zofran and DG abdomen. Will continue to monitor the pt.

## 2013-04-11 NOTE — Discharge Summary (Addendum)
Physician Discharge Summary  [name] MRN: [id number] DOB/AGE: [birth year] 76 y.o.  PCP: Leo Grosser, MD   Admit date: 04/07/2013 Discharge date: 04/11/2013  Discharge Diagnoses:   staph aureus UTI:  related to the chronic indwelling foley catheter.    Essential hypertension, benign   Hypertension   H/O: gout   UTI (lower urinary tract infection)   Diabetes mellitus   Hydronephrosis, bilateral   Prostate cancer metastatic to bone   Metabolic acidosis   Obstructive uropathy   Metastatic prostate cancer to bone.    Anemia of chronic disease   Weakness generalized   Chronic systolic CHF (congestive heart failure)   CKD (chronic kidney disease) stage 4, GFR 15-29 ml/min   Myocardial infarction   H/O: GI bleed   Palliative care encounter   Pain     Medication List    STOP taking these medications       amLODipine 5 MG tablet  Commonly known as:  NORVASC     ciprofloxacin 250 MG tablet  Commonly known as:  CIPRO      TAKE these medications       allopurinol 100 MG tablet  Commonly known as:  ZYLOPRIM  Take 200 mg by mouth daily.     bicalutamide 50 MG tablet  Commonly known as:  CASODEX  Take 50 mg by mouth daily.     doxycycline 50 MG capsule  Commonly known as:  VIBRAMYCIN  Take 1 capsule (50 mg total) by mouth 2 (two) times daily.     furosemide 40 MG tablet  Commonly known as:  LASIX  Take 1 tablet (40 mg total) by mouth daily.     hydrALAZINE 25 MG tablet  Commonly known as:  APRESOLINE  Take 1 tablet by mouth 3 (three) times daily.     isosorbide mononitrate 60 MG 24 hr tablet  Commonly known as:  IMDUR  Take 60 mg by mouth daily.     losartan 50 MG tablet  Commonly known as:  COZAAR  Take 1 tablet (50 mg total) by mouth daily.     metoprolol succinate 25 MG 24 hr tablet  Commonly known as:  TOPROL-XL  Take 25 mg by mouth daily.     nitroGLYCERIN 0.4 MG SL tablet  Commonly known as:  NITROSTAT  Place 0.4 mg under the  tongue every 5 (five) minutes as needed.     oxyCODONE-acetaminophen 5-325 MG per tablet  Commonly known as:  PERCOCET  Take 1 tablet by mouth every 4 (four) hours as needed for pain.     simvastatin 20 MG tablet  Commonly known as:  ZOCOR  Take 20 mg by mouth every evening.     sitaGLIPtin 25 MG tablet  Commonly known as:  JANUVIA  Take 1 tablet (25 mg total) by mouth daily with breakfast.     tamsulosin 0.4 MG Caps  Commonly known as:  FLOMAX  Take 0.4 mg by mouth daily.        Discharge Condition: stable  Disposition: 01-Home or Self Care   Consults: Palliative Medicine Team at [facility]    Significant Diagnostic Studies: Dg Lumbar Spine Complete  03/21/2013   *RADIOLOGY REPORT*  Clinical Data: Back pain  LUMBAR SPINE - COMPLETE 4+ VIEW  Comparison: 02/03/2013  Findings: Multifocal sclerotic foci throughout the visualized bone. Increased L2 superior endplate sclerosis and irregularity, may reflect mild compression deformity.  No retropulsion. Atherosclerotic vascular calcifications.  Minimal anterolisthesis of L4 on L5.  IMPRESSION: Sclerotic foci scattered throughout the visualized bones, in keeping with metastatic disease.  Sclerosis/mild irregularity at the L2 vertebral body superior endplate may reflect mild compression deformity.  No retropulsion.   Original Report Authenticated By: Jearld Lesch, M.D.   Ct Head Wo Contrast  04/07/2013   *RADIOLOGY REPORT*  Clinical Data:  Fall, right facial trauma, dizziness.  Diffuse bony metastatic disease, prostate cancer.  CT HEAD WITHOUT CONTRAST CT MAXILLOFACIAL WITHOUT CONTRAST  Technique:  Multidetector CT imaging of the head and maxillofacial structures were performed using the standard protocol without intravenous contrast. Multiplanar CT image reconstructions of the maxillofacial structures were also generated.  Comparison:  No similar prior study is available for comparison. Bone scan 02/05/2013 is reviewed.  CT HEAD   Findings: Examination is degraded by patient motion.  Images were repeated. Cortical volume loss noted with proportional ventricular prominence.  Periventricular white matter hypodensity likely indicates small vessel ischemic change.  No acute hemorrhage, acute infarction, or mass lesion is identified.  Remote bilateral external capsule lacunar infarcts.  Deformity of the right medial orbital wall is noted without acute fracture line visible.  IMPRESSION: No acute intracranial finding.  Right medial orbital wall deformity, detailed assess further below.  CT MAXILLOFACIAL  Findings:   The patient is edentulous.  Mild anterior displacement of the mandibular condyle is noted which could indicate subluxation of unknown chronicity.  Medial deformity of the medial right orbital wall is noted without fracture line.  The intraconal contents are normal as well as the globes.  Paranasal sinuses otherwise unremarkable.  No fracture or acute osseous abnormality. There is mild right facial subcutaneous fat stranding and swelling. No underlying fluid collection identified.  Cervical spine degenerative changes are partly visualized. Bilateral ostiomeatal units are patent.  Material within the right external auditory canal is compatible with cerumen.  IMPRESSION: Right facial soft tissue swelling without underlying facial bone fracture.  Apparent remote deformity of the medial right orbital wall.  No acute fracture identified.   Original Report Authenticated By: Christiana Pellant, M.D.   Dg Abd Portable 1v  04/11/2013   *RADIOLOGY REPORT*  Clinical Data: Abdominal pain nausea and vomiting  PORTABLE ABDOMEN - 1 VIEW  Comparison: 03/21/2013, 02/07/2013  Findings: Distended small bowel loops are present.  This is a single view and does not evaluate for air-fluid levels as an upright view was obtained.  Colon is decompressed with moderate stool the right colon.  Chronic degenerative changes in the lumbar spine and SI joints and right  hip  IMPRESSION: Distended small bowel loops suggestive of partial small bowel obstruction.   Original Report Authenticated By: Janeece Riggers, M.D.   Ct Maxillofacial Wo Cm  04/07/2013   *RADIOLOGY REPORT*  Clinical Data:  Fall, right facial trauma, dizziness.  Diffuse bony metastatic disease, prostate cancer.  CT HEAD WITHOUT CONTRAST CT MAXILLOFACIAL WITHOUT CONTRAST  Technique:  Multidetector CT imaging of the head and maxillofacial structures were performed using the standard protocol without intravenous contrast. Multiplanar CT image reconstructions of the maxillofacial structures were also generated.  Comparison:  No similar prior study is available for comparison. Bone scan 02/05/2013 is reviewed.  CT HEAD  Findings: Examination is degraded by patient motion.  Images were repeated. Cortical volume loss noted with proportional ventricular prominence.  Periventricular white matter hypodensity likely indicates small vessel ischemic change.  No acute hemorrhage, acute infarction, or mass lesion is identified.  Remote bilateral external capsule lacunar infarcts.  Deformity of the right medial orbital wall is  noted without acute fracture line visible.  IMPRESSION: No acute intracranial finding.  Right medial orbital wall deformity, detailed assess further below.  CT MAXILLOFACIAL  Findings:   The patient is edentulous.  Mild anterior displacement of the mandibular condyle is noted which could indicate subluxation of unknown chronicity.  Medial deformity of the medial right orbital wall is noted without fracture line.  The intraconal contents are normal as well as the globes.  Paranasal sinuses otherwise unremarkable.  No fracture or acute osseous abnormality. There is mild right facial subcutaneous fat stranding and swelling. No underlying fluid collection identified.  Cervical spine degenerative changes are partly visualized. Bilateral ostiomeatal units are patent.  Material within the right external auditory  canal is compatible with cerumen.  IMPRESSION: Right facial soft tissue swelling without underlying facial bone fracture.  Apparent remote deformity of the medial right orbital wall.  No acute fracture identified.   Original Report Authenticated By: Christiana Pellant, M.D.      Microbiology: Recent Results (from the past 240 hour(s))  URINE CULTURE     Status: None   Collection Time    04/05/13 11:23 AM      Result Value Range Status   Preliminary Report Culture reincubated for better growth   Preliminary  URINE CULTURE     Status: None   Collection Time    04/07/13  2:59 PM      Result Value Range Status   Specimen Description URINE, CATHETERIZED   Final   Special Requests NONE   Final   Culture  Setup Time 04/07/2013 21:46   Final   Colony Count >=100,000 COLONIES/ML   Final   Culture     Final   Value: STAPHYLOCOCCUS AUREUS     Note: RIFAMPIN AND GENTAMICIN SHOULD NOT BE USED AS SINGLE DRUGS FOR TREATMENT OF STAPH INFECTIONS.   Report Status 04/09/2013 FINAL   Final   Organism ID, Bacteria STAPHYLOCOCCUS AUREUS   Final  CULTURE, BLOOD (ROUTINE X 2)     Status: None   Collection Time    04/07/13  5:31 PM      Result Value Range Status   Specimen Description BLOOD LEFT FOREARM   Final   Special Requests BOTTLES DRAWN AEROBIC ONLY 10CC   Final   Culture  Setup Time 04/07/2013 21:40   Final   Culture     Final   Value:        BLOOD CULTURE RECEIVED NO GROWTH TO DATE CULTURE WILL BE HELD FOR 5 DAYS BEFORE ISSUING A FINAL NEGATIVE REPORT   Report Status PENDING   Incomplete  CULTURE, BLOOD (ROUTINE X 2)     Status: None   Collection Time    04/07/13  5:41 PM      Result Value Range Status   Specimen Description BLOOD RIGHT WRIST   Final   Special Requests BOTTLES DRAWN AEROBIC ONLY 3CC   Final   Culture  Setup Time 04/07/2013 21:42   Final   Culture     Final   Value:        BLOOD CULTURE RECEIVED NO GROWTH TO DATE CULTURE WILL BE HELD FOR 5 DAYS BEFORE ISSUING A FINAL NEGATIVE  REPORT   Report Status PENDING   Incomplete     Labs: Results for orders placed during the hospital encounter of 04/07/13 (from the past 48 hour(s))  CBC     Status: Abnormal   Collection Time    04/10/13  5:40 AM  Result Value Range   WBC 6.0  4.0 - 10.5 K/uL   RBC 3.26 (*) 4.22 - 5.81 MIL/uL   Hemoglobin 8.4 (*) 13.0 - 17.0 g/dL   HCT 21.3 (*) 08.6 - 57.8 %   MCV 78.2  78.0 - 100.0 fL   MCH 25.8 (*) 26.0 - 34.0 pg   MCHC 32.9  30.0 - 36.0 g/dL   RDW 46.9 (*) 62.9 - 52.8 %   Platelets 349  150 - 400 K/uL  BASIC METABOLIC PANEL     Status: Abnormal   Collection Time    04/10/13  5:40 AM      Result Value Range   Sodium 136  135 - 145 mEq/L   Potassium 5.2 (*) 3.5 - 5.1 mEq/L   Chloride 110  96 - 112 mEq/L   CO2 20  19 - 32 mEq/L   Glucose, Bld 95  70 - 99 mg/dL   BUN 22  6 - 23 mg/dL   Creatinine, Ser 4.13 (*) 0.50 - 1.35 mg/dL   Calcium 9.0  8.4 - 24.4 mg/dL   GFR calc non Af Amer 27 (*) >90 mL/min   GFR calc Af Amer 31 (*) >90 mL/min   Comment:            The eGFR has been calculated     using the CKD EPI equation.     This calculation has not been     validated in all clinical     situations.     eGFR's persistently     <90 mL/min signify     possible Chronic Kidney Disease.  CBC     Status: Abnormal   Collection Time    04/11/13  6:40 AM      Result Value Range   WBC 6.3  4.0 - 10.5 K/uL   RBC 3.54 (*) 4.22 - 5.81 MIL/uL   Hemoglobin 9.3 (*) 13.0 - 17.0 g/dL   HCT 01.0 (*) 27.2 - 53.6 %   MCV 78.5  78.0 - 100.0 fL   MCH 26.3  26.0 - 34.0 pg   MCHC 33.5  30.0 - 36.0 g/dL   RDW 64.4 (*) 03.4 - 74.2 %   Platelets 358  150 - 400 K/uL     HPI   78 y.o. male with multiple medical problems, including stage IV prostate cancer, chronic kidney disease, congestive heart failure, diabetes, hypertension, gout, anemia,who was brought to the emergency room via EMS after a fall at home today. He was found to be hypotensive. Patient reports that he lives with his  ex-wife and that he has little food and water during the day. He reports pain everywhere from his metastatic prostate cancer. Patient has a chronic indwelling Foley catheter. Patient denies any chest pain.  History is gathered from the patient and from reviewing extensive prior records  HOSPITAL COURSE:   Assessment/Plan:  Failure to thrive  Patient presented with dehydration and orthostasis in the setting of poor by mouth intake. He also has significant underlying systolic CHF.  Blood pressure medications held on admission and given gentle hydration.  Resumed lasix at a lower dose , and low dose cozaar  PT eval recommends SNF with palliative care to continue following  Hematuria  Patient states that this is chronic for him  He sees Dr Huston Foley had a  Foley catheter placed a month and a half ago  Patient on Casodex for metastatic prostate cancer  Outpatient urology followup prn or when foley  change is due  DC Lovenox  Follow CBC in one week Not interested in urology consult in the hospital ,got upset when it was mentioned    Recurrent UTIs  Patient has a chronic indwelling Foley. Has history of MRSA on urine culture. Staph aureus again noted on urine cx.  . Patient afebrile.Was on cipro prior to admisssion.   No leukocytosis. May be chronic colonization. In the setting of active hematuria, will start the patient on doxycycline for MRSA in the urine for 7 days      CAD with cardiomyopathy  EF of 25%.  Not a Candidate for antiplatelets given   GI bleed and hematuria . Currently dehydrated.  Cardiac diet, fluid restriction upto 1500cc     Type 2 diabetes  Continue Januvia and sliding scale insulin    Stage IV prostate CA  Patient has received palliative radiotherapy. Also receive hormonal therapy would disease progressing.  Continue Flomax . Has chronic indwelling foley and informs having appt with Dr Janifer Adie ( urology) tomorrow. i have called the office to notify Dr Janifer Adie.     CKD stage IV  Renal function seems at baseline. Not candidate for dialysis . Overall renal function has been improving , weekly BMP    Goals of care  Given multiple comorbidities and failure to thrive palliative care consult called in admission. Palliative care meeting held on 5/12 in presence of the patient and his daughter. They want patient to be treated for acute illnesses, IV fluids for a defined period of time, blood products as indicated, antibiotics for infection. They do not want any formal thought difficile I support, CPR or pressors. Do not want any form of artificial nutritional support or tube feeds    Code Status: DO NOT RESUSCITATE  Family Communication: discussed with daughter on 5/12    Discharge Exam:    Blood pressure 146/70, pulse 108, temperature 98.1 F (36.7 C), temperature source Oral, resp. rate 22, height 5\' 7"  (1.702 m), weight 73.9 kg (162 lb 14.7 oz), SpO2 100.00%.  General: Alert and oriented x3  Eyes: icterus vs muddy sclera  ENT: edentulous  Neck: no jugular venous distention  Cardiovascular: regular rate and rhythm without murmurs rubs or gallops  Respiratory: clear to auscultation bilaterally  Abdomen: soft nontender  Skin: pale dry  Musculoskeletal: diminished muscle bulk and tone  Psychiatric: euthymic  Neurologic: cranial nerves 2-12 intact, strength 4 out of 5 in the 4 extremities, sensation intact          Signed: Jancarlo Biermann 04/11/2013, 11:34 AM

## 2013-04-11 NOTE — Clinical Social Work Placement (Addendum)
    Clinical Social Work Department CLINICAL SOCIAL WORK PLACEMENT NOTE 04/11/2013  Patient:  [name], [name]  Account Number:  [id number] Admit date:  04/07/2013  Clinical Social Worker:  Lupita Leash Hector Taft, LCSWA  Date/time:  04/09/2013 04:00 PM  Clinical Social Work is seeking post-discharge placement for this patient at the following level of care:   SKILLED NURSING   (*CSW will update this form in Epic as items are completed)   04/09/2013  Patient/family provided with [facility] Department of Clinical Social Work's list of facilities offering this level of care within the geographic area requested by the patient (or if unable, by the patient's family).  04/09/2013  Patient/family informed of their freedom to choose among providers that offer the needed level of care, that participate in Medicare, Medicaid or managed care program needed by the patient, have an available bed and are willing to accept the patient.  04/09/2013  Patient/family informed of [facility]' ownership interest in [facility], as well as of the fact that they are under no obligation to receive care at this facility.  PASARR submitted to EDS on  PASARR number received from E[employer] on   FL2 transmitted to all facilities in geographic area requested by pt/family on  04/10/13 FL2 transmitted to all facilities within larger geographic area on   Patient informed that his/her managed care company has contracts with or will negotiate with  certain facilities, including the following:   NA     Patient/family informed of bed offers received:  04/11/13 Patient chooses bed at [facility] (daughter - [name]'s choice) Physician recommends and patient chooses bed at    Patient to be transferred to [facility]   on  04/11/13 Patient to be transferred to facility by Ambulance  [name])  The following physician request were entered in Epic:   Additional Comments: 04/11/13.  CSW was able to reach pt's daughters [name] and  [name].  Bed chosen at [place].  Ok per MD for dc today to SNF.  Patient is agreeable to daughter's choice.  Notified SNF and pt's nurse of d/c plan.  No further CSW needs identified.  CSW signing off.  Lorri Frederick. [place]  [phone]

## 2013-04-11 NOTE — Clinical Social Work Psychosocial (Addendum)
    Clinical Social Work Department BRIEF PSYCHOSOCIAL ASSESSMENT 04/11/2013  Patient:  [name], [name]     Account Number:  [id number]     Admit date:  04/07/2013  Clinical Social Worker:  Tiburcio Pea  Date/Time:  04/09/2013 02:30 PM  Referred by:  Physician  Date Referred:  04/08/2013 Referred for  SNF Placement   Other Referral:   Interview type:  Patient Other interview type:   CSW is attempting to reach his daughter [name]    PSYCHOSOCIAL DATA Living Status:  OTHER Admitted from facility:   Level of care:   Primary support name:  Johm Pfannenstiel    [phone] Primary support relationship to patient:  CHILD, ADULT Degree of support available:   Strong support    Ex wife is supportive; patient has been living with her recently.    2nd daughter  Sherran Needs  [phone]    CURRENT CONCERNS Current Concerns  Post-Acute Placement   Other Concerns:    SOCIAL WORK ASSESSMENT / PLAN CSW met with patient today to discuss d/c needs/disposition. Patient appeared quite angry and frustrated wtih CSW and stated "why are you talking to me about this?  I know I need to go to a nursing home- my family can't take care of me anymore. I don't want or need all this frustration."  Patient verbalized ok for CSW to contact either daughter- [name] or Harvel Quale but did not know their numbers.  "That's your job to find out. Don't bother me again."  CSW let SNF list in room and briefly explained bed search process.  Patient did not have a preference for SNF.  Will follow up with daughters.  FL2 placed on chart for MD's signature.   Assessment/plan status:  Psychosocial Support/Ongoing Assessment of Needs Other assessment/ plan:   Information/referral to community resources:   SNF bed list provided to patient.  He did not wish to review or discuss.    PATIENT'S/FAMILY'S RESPONSE TO PLAN OF CARE: Patient appears alert and oriented but he did not want to converse with CSW  about dc plan. Nursing stated that he is easily frustrated with them as well and that he doesn't really like to talk much.  CSW will provide support as needed and follow up wiht daughters for SNF bed.  Active bed search is in place.

## 2013-04-12 ENCOUNTER — Other Ambulatory Visit: Payer: Self-pay | Admitting: *Deleted

## 2013-04-12 MED ORDER — OXYCODONE-ACETAMINOPHEN 5-325 MG PO TABS: 1.0000 | ORAL_TABLET | ORAL | Status: DC | PRN

## 2013-04-12 NOTE — Progress Notes (Signed)
Date: 04/12/2013  MRN:  [id number] Name:  [name] Sex:  male Age:  78 y.o. DOB:12-06-1934   PSC #:                       Facility/Room;[facility] Level Of Care: Provider: Dr. Murray Hodgkins  Emergency Contacts: Contact Information   Name Relation Home Work [place] Daughter   [phone]   Silveira,Jacquelyn Daughter   [phone]      Code Status:DNR MOST Form:  Allergies: Allergies  Allergen Reactions  . Aspirin Other (See Comments)    "makes my stomach hurt real bad."  . Feldene (Piroxicam) Swelling     Chief Complaint  Patient presents with  . Medical Managment of Chronic Issues    New admit to SNF following hospitalization      HPI:77 y.o. male with multiple medical problems, including stage IV prostate cancer, chronic kidney disease, congestive heart failure, diabetes, hypertension, gout, anemia,who was brought to the emergency room via EMS after a fall at home today. He was found to be hypotensive. Patient reports that he lives with his ex-wife and that he has little food and water during the day. He reports pain everywhere from his metastatic prostate cancer. Patient has a chronic indwelling Foley catheter. Patient denies any chest pain.  PT eval recommends SNF with palliative care to continue following Patient was discharged to [facility] for SNF rehab.      Past Medical History  Diagnosis Date  . CAD (coronary artery disease)   . Hypertension   . Obesity   . Dyslipidemia   . Anemia   . H/O hemorrhoids   . H/O: GI bleed   . Rhinitis, allergic   . Fatty liver   . Shoulder dislocation 1970    right  . Myocardial infarction 2007  . History of gout   . Asthma   . Arthritis     "all over my whole body" (03/07/2013)  . Chronic kidney disease (CKD), stage III (moderate)     ckd III  . Carpal tunnel syndrome   . Prostate cancer 02/11/13-02/20/13    metastatic prostate cancer  . Bone metastasis   . Fall at home 04/07/2013  . CHF  (congestive heart failure)   . Type II diabetes mellitus   . Foley catheter in place     "Coude" (04/08/2013)    Past Surgical History  Procedure Laterality Date  . Transurethral resection of prostate  06/08/2011, 11/08/06  . Nerve repair Right 1974    "hand" (03/07/2013)  . Cataract extraction w/ intraocular lens  implant, bilateral Bilateral      Procedures: Dg Lumbar Spine Complete  03/21/2013 *RADIOLOGY REPORT* Clinical Data: Back pain LUMBAR SPINE - COMPLETE 4+ VIEW Comparison: 02/03/2013 Findings: Multifocal sclerotic foci throughout the visualized bone. Increased L2 superior endplate sclerosis and irregularity, may reflect mild compression deformity. No retropulsion. Atherosclerotic vascular calcifications. Minimal anterolisthesis of L4 on L5. IMPRESSION: Sclerotic foci scattered throughout the visualized bones, in keeping with metastatic disease. Sclerosis/mild irregularity at the L2 vertebral body superior endplate may reflect mild compression deformity. No retropulsion. Original Report Authenticated By: Jearld Lesch, M.D.  Ct Head Wo Contrast  04/07/2013 *RADIOLOGY REPORT* Clinical Data: Fall, right facial trauma, dizziness. Diffuse bony metastatic disease, prostate cancer. CT HEAD WITHOUT CONTRAST CT MAXILLOFACIAL WITHOUT CONTRAST Technique: Multidetector CT imaging of the head and maxillofacial structures were performed using the standard protocol without intravenous contrast. Multiplanar CT image reconstructions of the maxillofacial structures were  also generated. Comparison: No similar prior study is available for comparison. Bone scan 02/05/2013 is reviewed. CT HEAD Findings: Examination is degraded by patient motion. Images were repeated. Cortical volume loss noted with proportional ventricular prominence. Periventricular white matter hypodensity likely indicates small vessel ischemic change. No acute hemorrhage, acute infarction, or mass lesion is identified. Remote bilateral external  capsule lacunar infarcts. Deformity of the right medial orbital wall is noted without acute fracture line visible. IMPRESSION: No acute intracranial finding. Right medial orbital wall deformity, detailed assess further below. CT MAXILLOFACIAL Findings: The patient is edentulous. Mild anterior displacement of the mandibular condyle is noted which could indicate subluxation of unknown chronicity. Medial deformity of the medial right orbital wall is noted without fracture line. The intraconal contents are normal as well as the globes. Paranasal sinuses otherwise unremarkable. No fracture or acute osseous abnormality. There is mild right facial subcutaneous fat stranding and swelling. No underlying fluid collection identified. Cervical spine degenerative changes are partly visualized. Bilateral ostiomeatal units are patent. Material within the right external auditory canal is compatible with cerumen. IMPRESSION: Right facial soft tissue swelling without underlying facial bone fracture. Apparent remote deformity of the medial right orbital wall. No acute fracture identified. Original Report Authenticated By: Christiana Pellant, M.D.  Dg Abd Portable 1v  04/11/2013 *RADIOLOGY REPORT* Clinical Data: Abdominal pain nausea and vomiting PORTABLE ABDOMEN - 1 VIEW Comparison: 03/21/2013, 02/07/2013 Findings: Distended small bowel loops are present. This is a single view and does not evaluate for air-fluid levels as an upright view was obtained. Colon is decompressed with moderate stool the right colon. Chronic degenerative changes in the lumbar spine and SI joints and right hip IMPRESSION: Distended small bowel loops suggestive of partial small bowel obstruction. Original Report Authenticated By: Janeece Riggers, M.D.  Ct Maxillofacial Wo Cm  04/07/2013 *RADIOLOGY REPORT* Clinical Data: Fall, right facial trauma, dizziness. Diffuse bony metastatic disease, prostate cancer. CT HEAD WITHOUT CONTRAST CT MAXILLOFACIAL WITHOUT CONTRAST  Technique: Multidetector CT imaging of the head and maxillofacial structures were performed using the standard protocol without intravenous contrast. Multiplanar CT image reconstructions of the maxillofacial structures were also generated. Comparison: No similar prior study is available for comparison. Bone scan 02/05/2013 is reviewed. CT HEAD Findings: Examination is degraded by patient motion. Images were repeated. Cortical volume loss noted with proportional ventricular prominence. Periventricular white matter hypodensity likely indicates small vessel ischemic change. No acute hemorrhage, acute infarction, or mass lesion is identified. Remote bilateral external capsule lacunar infarcts. Deformity of the right medial orbital wall is noted without acute fracture line visible. IMPRESSION: No acute intracranial finding. Right medial orbital wall deformity, detailed assess further below. CT MAXILLOFACIAL Findings: The patient is edentulous. Mild anterior displacement of the mandibular condyle is noted which could indicate subluxation of unknown chronicity. Medial deformity of the medial right orbital wall is noted without fracture line. The intraconal contents are normal as well as the globes. Paranasal sinuses otherwise unremarkable. No fracture or acute osseous abnormality. There is mild right facial subcutaneous fat stranding and swelling. No underlying fluid collection identified. Cervical spine degenerative changes are partly visualized. Bilateral ostiomeatal units are patent. Material within the right external auditory canal is compatible with cerumen. IMPRESSION: Right facial soft tissue swelling without underlying facial bone fracture. Apparent remote deformity of the medial right orbital wall. No acute fracture identified. Original Report Authenticated By: Christiana Pellant, M.D.     Consultants:  Current Outpatient Prescriptions  Medication Sig Dispense Refill  . allopurinol (ZYLOPRIM) 100 MG tablet  Take 200 mg by mouth daily.      . bicalutamide (CASODEX) 50 MG tablet Take 50 mg by mouth daily.       Marland Kitchen doxycycline (VIBRAMYCIN) 50 MG capsule Take 1 capsule (50 mg total) by mouth 2 (two) times daily.  14 capsule  0  . furosemide (LASIX) 40 MG tablet Take 1 tablet (40 mg total) by mouth daily.  30 tablet  0  . hydrALAZINE (APRESOLINE) 25 MG tablet Take 1 tablet by mouth 3 (three) times daily.      . isosorbide mononitrate (IMDUR) 60 MG 24 hr tablet Take 60 mg by mouth daily.       Marland Kitchen losartan (COZAAR) 50 MG tablet Take 1 tablet (50 mg total) by mouth daily.  90 tablet  3  . metoprolol succinate (TOPROL-XL) 25 MG 24 hr tablet Take 25 mg by mouth daily.      . nitroGLYCERIN (NITROSTAT) 0.4 MG SL tablet Place 0.4 mg under the tongue every 5 (five) minutes as needed.        Marland Kitchen oxyCODONE-acetaminophen (PERCOCET) 5-325 MG per tablet Take 1 tablet by mouth every 4 (four) hours as needed for pain.  120 tablet  0  . simvastatin (ZOCOR) 20 MG tablet Take 20 mg by mouth every evening.      . sitaGLIPtin (JANUVIA) 25 MG tablet Take 1 tablet (25 mg total) by mouth daily with breakfast.  30 tablet  0  . Tamsulosin HCl (FLOMAX) 0.4 MG CAPS Take 0.4 mg by mouth daily.       No current facility-administered medications for this visit.     There is no immunization history on file for this patient.   Diet:  History  Substance Use Topics  . Smoking status: Former Games developer  . Smokeless tobacco: Never Used     Comment: QUIT "YEARS" AGO  . Alcohol Use: No     Comment: former heavy use    Family History  Problem Relation Age of Onset  . Diabetes Mother        Vital signs: BP 135/77  Pulse 85  Resp 20    General Appearance:    Alert, cooperative, no distress, appears stated age  Head:    Normocephalic, without obvious abnormality, atraumatic  Eyes:    PERRL, conjunctiva/corneas clear, EOM's intact, fundi    benign, both eyes       Ears:    Normal TM's and external ear canals, both ears  Nose:    Nares normal, septum midline, mucosa normal, no drainage   or sinus tenderness  Throat:   Lips, mucosa, and tongue normal; teeth and gums normal  Neck:   Supple, symmetrical, trachea midline, no adenopathy;       thyroid:  No enlargement/tenderness/nodules; no carotid   bruit or JVD  Back:     Symmetric, no curvature, ROM normal, no CVA tenderness  Lungs:     Clear to auscultation bilaterally, respirations unlabored  Chest wall:    No tenderness or deformity  Heart:    Regular rate and rhythm, S1 and S2 normal, no murmur, rub   or gallop  Abdomen:     Soft, non-tender, bowel sounds active all four quadrants,    no masses, no organomegaly  Genitalia:    Normal male without lesion, discharge or tenderness  Rectal:    Normal tone, normal prostate, no masses or tenderness;   guaiac negative stool  Extremities:   Extremities normal, atraumatic, no cyanosis or edema  Pulses:   2+ and symmetric all extremities  Skin:   Skin color, texture, turgor normal, no rashes or lesions  Lymph nodes:   Cervical, supraclavicular, and axillary nodes normal  Neurologic:   CNII-XII intact. Normal strength, sensation and reflexes      throughout     Screening Score  MMS    PHQ2    PHQ9     Fall Risk    BIMS    Admission on 04/07/2013, Discharged on 04/11/2013  Component Date Value Range Status  . WBC 04/07/2013 4.7  4.0 - 10.5 K/uL Final  . RBC 04/07/2013 3.47* 4.22 - 5.81 MIL/uL Final  . Hemoglobin 04/07/2013 9.0* 13.0 - 17.0 g/dL Final  . HCT 11/91/4782 26.7* 39.0 - 52.0 % Final  . MCV 04/07/2013 76.9* 78.0 - 100.0 fL Final  . MCH 04/07/2013 25.9* 26.0 - 34.0 pg Final  . MCHC 04/07/2013 33.7  30.0 - 36.0 g/dL Final  . RDW 95/62/1308 18.0* 11.5 - 15.5 % Final  . Platelets 04/07/2013 402* 150 - 400 K/uL Final  . Neutrophils Relative % 04/07/2013 66  43 - 77 % Final  . Neutro Abs 04/07/2013 3.1  1.7 - 7.7 K/uL Final  . Lymphocytes Relative 04/07/2013 21  12 - 46 % Final  . Lymphs Abs  04/07/2013 1.0  0.7 - 4.0 K/uL Final  . Monocytes Relative 04/07/2013 7  3 - 12 % Final  . Monocytes Absolute 04/07/2013 0.3  0.1 - 1.0 K/uL Final  . Eosinophils Relative 04/07/2013 6* 0 - 5 % Final  . Eosinophils Absolute 04/07/2013 0.3  0.0 - 0.7 K/uL Final  . Basophils Relative 04/07/2013 1  0 - 1 % Final  . Basophils Absolute 04/07/2013 0.0  0.0 - 0.1 K/uL Final  . Sodium 04/07/2013 132* 135 - 145 mEq/L Final  . Potassium 04/07/2013 4.6  3.5 - 5.1 mEq/L Final  . Chloride 04/07/2013 106  96 - 112 mEq/L Final  . CO2 04/07/2013 18* 19 - 32 mEq/L Final  . Glucose, Bld 04/07/2013 104* 70 - 99 mg/dL Final  . BUN 65/78/4696 36* 6 - 23 mg/dL Final  . Creatinine, Ser 04/07/2013 2.86* 0.50 - 1.35 mg/dL Final  . Calcium 29/52/8413 8.8  8.4 - 10.5 mg/dL Final  . Total Protein 04/07/2013 6.0  6.0 - 8.3 g/dL Final  . Albumin 24/40/1027 2.6* 3.5 - 5.2 g/dL Final  . AST 25/36/6440 18  0 - 37 U/L Final  . ALT 04/07/2013 10  0 - 53 U/L Final  . Alkaline Phosphatase 04/07/2013 283* 39 - 117 U/L Final  . Total Bilirubin 04/07/2013 0.5  0.3 - 1.2 mg/dL Final  . GFR calc non Af Amer 04/07/2013 20* >90 mL/min Final  . GFR calc Af Amer 04/07/2013 23* >90 mL/min Final   Comment:                                 The eGFR has been calculated                          using the CKD EPI equation.                          This calculation has not been  validated in all clinical                          situations.                          eGFR's persistently                          <90 mL/min signify                          possible Chronic Kidney Disease.  . Color, Urine 04/07/2013 AMBER* YELLOW Final   BIOCHEMICALS MAY BE AFFECTED BY COLOR  . APPearance 04/07/2013 TURBID* CLEAR Final  . Specific Gravity, Urine 04/07/2013 1.016  1.005 - 1.030 Final  . pH 04/07/2013 6.0  5.0 - 8.0 Final  . Glucose, UA 04/07/2013 NEGATIVE  NEGATIVE mg/dL Final  . Hgb urine dipstick 04/07/2013  LARGE* NEGATIVE Final  . Bilirubin Urine 04/07/2013 NEGATIVE  NEGATIVE Final  . Ketones, ur 04/07/2013 15* NEGATIVE mg/dL Final  . Protein, ur 16/08/9603 >300* NEGATIVE mg/dL Final  . Urobilinogen, UA 04/07/2013 1.0  0.0 - 1.0 mg/dL Final  . Nitrite 54/07/8118 POSITIVE* NEGATIVE Final  . Leukocytes, UA 04/07/2013 LARGE* NEGATIVE Final  . Troponin i, poc 04/07/2013 0.12* 0.00 - 0.08 ng/mL Final  . Comment 04/07/2013 NOTIFIED PHYSICIAN   Final  . Comment 3 04/07/2013          Final   Comment: Due to the release kinetics of cTnI,                          a negative result within the first hours                          of the onset of symptoms does not rule out                          myocardial infarction with certainty.                          If myocardial infarction is still suspected,                          repeat the test at appropriate intervals.  . WBC, UA 04/07/2013 TOO NUMEROUS TO COUNT  <3 WBC/hpf Final  . RBC / HPF 04/07/2013 TOO NUMEROUS TO COUNT  <3 RBC/hpf Final  . Bacteria, UA 04/07/2013 FEW* RARE Final  . Specimen Description 04/07/2013 URINE, CATHETERIZED   Final  . Special Requests 04/07/2013 NONE   Final  . Culture  Setup Time 04/07/2013 04/07/2013 21:46   Final  . Colony Count 04/07/2013 >=100,000 COLONIES/ML   Final  . Culture 04/07/2013    Final                   Value:STAPHYLOCOCCUS AUREUS                         Note: RIFAMPIN AND GENTAMICIN SHOULD NOT BE USED AS SINGLE DRUGS FOR TREATMENT OF STAPH INFECTIONS.  Marland Kitchen Report Status 04/07/2013 04/09/2013 FINAL   Final  . Organism ID, Bacteria 04/07/2013 STAPHYLOCOCCUS AUREUS  Final  . Specimen Description 04/07/2013 BLOOD LEFT FOREARM   Final  . Special Requests 04/07/2013 BOTTLES DRAWN AEROBIC ONLY 10CC   Final  . Culture  Setup Time 04/07/2013 04/07/2013 21:40   Final  . Culture 04/07/2013        BLOOD CULTURE RECEIVED NO GROWTH TO DATE CULTURE WILL BE HELD FOR 5 DAYS BEFORE ISSUING A FINAL NEGATIVE REPORT    Final  . Report Status 04/07/2013 PENDING   Incomplete  . Specimen Description 04/07/2013 BLOOD RIGHT WRIST   Final  . Special Requests 04/07/2013 BOTTLES DRAWN AEROBIC ONLY 3CC   Final  . Culture  Setup Time 04/07/2013 04/07/2013 21:42   Final  . Culture 04/07/2013        BLOOD CULTURE RECEIVED NO GROWTH TO DATE CULTURE WILL BE HELD FOR 5 DAYS BEFORE ISSUING A FINAL NEGATIVE REPORT   Final  . Report Status 04/07/2013 PENDING   Incomplete  . Sodium 04/08/2013 135  135 - 145 mEq/L Final  . Potassium 04/08/2013 4.7  3.5 - 5.1 mEq/L Final  . Chloride 04/08/2013 109  96 - 112 mEq/L Final  . CO2 04/08/2013 18* 19 - 32 mEq/L Final  . Glucose, Bld 04/08/2013 100* 70 - 99 mg/dL Final  . BUN 16/08/9603 34* 6 - 23 mg/dL Final  . Creatinine, Ser 04/08/2013 2.95* 0.50 - 1.35 mg/dL Final  . Calcium 54/07/8118 8.8  8.4 - 10.5 mg/dL Final  . GFR calc non Af Amer 04/08/2013 19* >90 mL/min Final  . GFR calc Af Amer 04/08/2013 22* >90 mL/min Final   Comment:                                 The eGFR has been calculated                          using the CKD EPI equation.                          This calculation has not been                          validated in all clinical                          situations.                          eGFR's persistently                          <90 mL/min signify                          possible Chronic Kidney Disease.  . WBC 04/08/2013 5.1  4.0 - 10.5 K/uL Final  . RBC 04/08/2013 3.24* 4.22 - 5.81 MIL/uL Final  . Hemoglobin 04/08/2013 8.4* 13.0 - 17.0 g/dL Final  . HCT 14/78/2956 25.1* 39.0 - 52.0 % Final  . MCV 04/08/2013 77.5* 78.0 - 100.0 fL Final  . MCH 04/08/2013 25.9* 26.0 - 34.0 pg Final  . MCHC 04/08/2013 33.5  30.0 - 36.0 g/dL Final  . RDW 21/30/8657 18.1* 11.5 - 15.5 % Final  . Platelets 04/08/2013 352  150 - 400 K/uL  Final  . Glucose-Capillary 04/08/2013 96  70 - 99 mg/dL Final  . Comment 1 16/08/9603 Notify RN   Final  . WBC 04/10/2013 6.0  4.0 -  10.5 K/uL Final  . RBC 04/10/2013 3.26* 4.22 - 5.81 MIL/uL Final  . Hemoglobin 04/10/2013 8.4* 13.0 - 17.0 g/dL Final  . HCT 54/07/8118 25.5* 39.0 - 52.0 % Final  . MCV 04/10/2013 78.2  78.0 - 100.0 fL Final  . MCH 04/10/2013 25.8* 26.0 - 34.0 pg Final  . MCHC 04/10/2013 32.9  30.0 - 36.0 g/dL Final  . RDW 14/78/2956 18.1* 11.5 - 15.5 % Final  . Platelets 04/10/2013 349  150 - 400 K/uL Final  . Sodium 04/10/2013 136  135 - 145 mEq/L Final  . Potassium 04/10/2013 5.2* 3.5 - 5.1 mEq/L Final  . Chloride 04/10/2013 110  96 - 112 mEq/L Final  . CO2 04/10/2013 20  19 - 32 mEq/L Final  . Glucose, Bld 04/10/2013 95  70 - 99 mg/dL Final  . BUN 21/30/8657 22  6 - 23 mg/dL Final  . Creatinine, Ser 04/10/2013 2.27* 0.50 - 1.35 mg/dL Final  . Calcium 84/69/6295 9.0  8.4 - 10.5 mg/dL Final  . GFR calc non Af Amer 04/10/2013 27* >90 mL/min Final  . GFR calc Af Amer 04/10/2013 31* >90 mL/min Final   Comment:                                 The eGFR has been calculated                          using the CKD EPI equation.                          This calculation has not been                          validated in all clinical                          situations.                          eGFR's persistently                          <90 mL/min signify                          possible Chronic Kidney Disease.  . WBC 04/11/2013 6.3  4.0 - 10.5 K/uL Final  . RBC 04/11/2013 3.54* 4.22 - 5.81 MIL/uL Final  . Hemoglobin 04/11/2013 9.3* 13.0 - 17.0 g/dL Final  . HCT 28/41/3244 27.8* 39.0 - 52.0 % Final  . MCV 04/11/2013 78.5  78.0 - 100.0 fL Final  . MCH 04/11/2013 26.3  26.0 - 34.0 pg Final  . MCHC 04/11/2013 33.5  30.0 - 36.0 g/dL Final  . RDW 11/30/7251 18.3* 11.5 - 15.5 % Final  . Platelets 04/11/2013 358  150 - 400 K/uL Final  Office Visit on 04/05/2013  Component Date Value Range Status  . Sodium 04/05/2013 136  135 - 145 mEq/L Final  . Potassium 04/05/2013 5.8* 3.5 - 5.3 mEq/L Final   No visible  hemolysis.  Marland Kitchen  Chloride 04/05/2013 111  96 - 112 mEq/L Final  . CO2 04/05/2013 13* 19 - 32 mEq/L Final  . Glucose, Bld 04/05/2013 144* 70 - 99 mg/dL Final  . BUN 16/08/9603 39* 6 - 23 mg/dL Final  . Creat 54/07/8118 2.97* 0.50 - 1.35 mg/dL Final  . Calcium 14/78/2956 9.4  8.4 - 10.5 mg/dL Final  . WBC 21/30/8657 6.0  4.0 - 10.5 K/uL Final  . RBC 04/05/2013 3.54* 4.22 - 5.81 MIL/uL Final  . Hemoglobin 04/05/2013 9.0* 13.0 - 17.0 g/dL Final  . HCT 84/69/6295 28.5* 39.0 - 52.0 % Final  . MCV 04/05/2013 80.5  78.0 - 100.0 fL Final  . MCH 04/05/2013 25.4* 26.0 - 34.0 pg Final  . MCHC 04/05/2013 31.6  30.0 - 36.0 g/dL Final  . RDW 28/41/3244 19.5* 11.5 - 15.5 % Final  . Platelets 04/05/2013 408* 150 - 400 K/uL Final  . Neutrophils Relative % 04/05/2013 66  43 - 77 % Final  . Neutro Abs 04/05/2013 4.0  1.7 - 7.7 K/uL Final  . Lymphocytes Relative 04/05/2013 22  12 - 46 % Final  . Lymphs Abs 04/05/2013 1.3  0.7 - 4.0 K/uL Final  . Monocytes Relative 04/05/2013 5  3 - 12 % Final  . Monocytes Absolute 04/05/2013 0.3  0.1 - 1.0 K/uL Final  . Eosinophils Relative 04/05/2013 6* 0 - 5 % Final  . Eosinophils Absolute 04/05/2013 0.3  0.0 - 0.7 K/uL Final  . Basophils Relative 04/05/2013 1  0 - 1 % Final  . Basophils Absolute 04/05/2013 0.1  0.0 - 0.1 K/uL Final  . Smear Review 04/05/2013 Criteria for review not met   Final  . Color, Urine 04/05/2013 BROWN* YELLOW Final   Biochemicals may be affected by the color of the urine.  Marland Kitchen APPearance 04/05/2013 TURBID* CLEAR Final  . Specific Gravity, Urine 04/05/2013 1.020  1.005 - 1.030 Final  . pH 04/05/2013 6.0  5.0 - 8.0 Final  . Glucose, UA 04/05/2013 NEG  NEG mg/dL Final  . Bilirubin Urine 04/05/2013 SMALL* NEG Final  . Ketones, ur 04/05/2013 NEG  NEG mg/dL Final  . Hgb urine dipstick 04/05/2013 LARGE* NEG Final  . Protein, ur 04/05/2013 > 300* NEG mg/dL Final  . Urobilinogen, UA 04/05/2013 0.2  0.0 - 1.0 mg/dL Final  . Nitrite 11/30/7251 POS* NEG  Final  . Leukocytes, UA 04/05/2013 LARGE* NEG Final  . Culture 04/05/2013 STAPHYLOCOCCUS AUREUS   Final  . Colony Count 04/05/2013 70,000 COLONIES/ML   Final  . Organism ID, Bacteria 04/05/2013 STAPHYLOCOCCUS AUREUS   Final   Comment: Rifampin and Gentamicin should not be used as                          single drugs for treatment of Staph infections.  . Squamous Epithelial / LPF 04/05/2013 RARE  RARE Final  . Crystals 04/05/2013 NONE SEEN  NONE SEEN Final  . Casts 04/05/2013 NONE SEEN  NONE SEEN Final  . WBC, UA 04/05/2013 TNTC* <3 WBC/hpf Final  . RBC / HPF 04/05/2013 TNTC* <3 RBC/hpf Final  . Bacteria, UA 04/05/2013 MANY* RARE Final  . Urine-Other 04/05/2013 TOO TURBID TO SPIN   Final  Office Visit on 03/28/2013  Component Date Value Range Status  . Sodium 03/28/2013 134* 135 - 145 mEq/L Final  . Potassium 03/28/2013 4.6  3.5 - 5.3 mEq/L Final  . Chloride 03/28/2013 107  96 - 112 mEq/L Final  . CO2 03/28/2013  16* 19 - 32 mEq/L Final  . Glucose, Bld 03/28/2013 178* 70 - 99 mg/dL Final  . BUN 14/78/2956 28* 6 - 23 mg/dL Final  . Creat 21/30/8657 2.92* 0.50 - 1.35 mg/dL Final  . Calcium 84/69/6295 9.1  8.4 - 10.5 mg/dL Final  Admission on 28/41/3244, Discharged on 03/22/2013  Component Date Value Range Status  . Color, Urine 03/22/2013 RED* YELLOW Final   BIOCHEMICALS MAY BE AFFECTED BY COLOR  . APPearance 03/22/2013 TURBID* CLEAR Final  . Specific Gravity, Urine 03/22/2013 1.022  1.005 - 1.030 Final  . pH 03/22/2013 6.0  5.0 - 8.0 Final  . Glucose, UA 03/22/2013 NEGATIVE  NEGATIVE mg/dL Final  . Hgb urine dipstick 03/22/2013 LARGE* NEGATIVE Final  . Bilirubin Urine 03/22/2013 SMALL* NEGATIVE Final  . Ketones, ur 03/22/2013 NEGATIVE  NEGATIVE mg/dL Final  . Protein, ur 11/30/7251 >300* NEGATIVE mg/dL Final  . Urobilinogen, UA 03/22/2013 1.0  0.0 - 1.0 mg/dL Final  . Nitrite 66/44/0347 POSITIVE* NEGATIVE Final  . Leukocytes, UA 03/22/2013 LARGE* NEGATIVE Final  .  Glucose-Capillary 03/21/2013 117* 70 - 99 mg/dL Final  . Squamous Epithelial / LPF 03/22/2013 RARE  RARE Final  . WBC, UA 03/22/2013 TOO NUMEROUS TO COUNT  <3 WBC/hpf Final  . RBC / HPF 03/22/2013 TOO NUMEROUS TO COUNT  <3 RBC/hpf Final  . Bacteria, UA 03/22/2013 FEW* RARE Final  . Casts 03/22/2013 HYALINE CASTS* NEGATIVE Final  . Specimen Description 03/22/2013 URINE, CATHETERIZED   Final  . Special Requests 03/22/2013 NONE   Final  . Culture  Setup Time 03/22/2013 03/22/2013 08:48   Final  . Colony Count 03/22/2013 >=100,000 COLONIES/ML   Final  . Culture 03/22/2013    Final                   Value:STAPHYLOCOCCUS AUREUS                         Note: RIFAMPIN AND GENTAMICIN SHOULD NOT BE USED AS SINGLE DRUGS FOR TREATMENT OF STAPH INFECTIONS.  Marland Kitchen Report Status 03/22/2013 03/24/2013 FINAL   Final  . Organism ID, Bacteria 03/22/2013 STAPHYLOCOCCUS AUREUS   Final  Office Visit on 03/21/2013  Component Date Value Range Status  . Sodium 03/21/2013 138  135 - 145 mEq/L Final  . Potassium 03/21/2013 5.6* 3.5 - 5.3 mEq/L Final   No visible hemolysis.  . Chloride 03/21/2013 110  96 - 112 mEq/L Final  . CO2 03/21/2013 21  19 - 32 mEq/L Final  . Glucose, Bld 03/21/2013 151* 70 - 99 mg/dL Final  . BUN 42/59/5638 26* 6 - 23 mg/dL Final  . Creat 75/64/3329 2.79* 0.50 - 1.35 mg/dL Final  . Calcium 51/88/4166 9.0  8.4 - 10.5 mg/dL Final  . WBC 05/27/1600 4.9  4.0 - 10.5 K/uL Final  . RBC 03/21/2013 3.49* 4.22 - 5.81 MIL/uL Final  . Hemoglobin 03/21/2013 9.0* 13.0 - 17.0 g/dL Final  . HCT 09/32/3557 28.4* 39.0 - 52.0 % Final  . MCV 03/21/2013 81.4  78.0 - 100.0 fL Final  . MCH 03/21/2013 25.8* 26.0 - 34.0 pg Final  . MCHC 03/21/2013 31.7  30.0 - 36.0 g/dL Final  . RDW 32/20/2542 19.6* 11.5 - 15.5 % Final  . Platelets 03/21/2013 286  150 - 400 K/uL Final  . Neutrophils Relative % 03/21/2013 64  43 - 77 % Final  . Neutro Abs 03/21/2013 3.2  1.7 - 7.7 K/uL Final  . Lymphocytes Relative 03/21/2013 21  12 - 46 % Final  . Lymphs Abs 03/21/2013 1.0  0.7 - 4.0 K/uL Final  . Monocytes Relative 03/21/2013 8  3 - 12 % Final  . Monocytes Absolute 03/21/2013 0.4  0.1 - 1.0 K/uL Final  . Eosinophils Relative 03/21/2013 6* 0 - 5 % Final  . Eosinophils Absolute 03/21/2013 0.3  0.0 - 0.7 K/uL Final  . Basophils Relative 03/21/2013 1  0 - 1 % Final  . Basophils Absolute 03/21/2013 0.0  0.0 - 0.1 K/uL Final  . Smear Review 03/21/2013 Criteria for review not met   Final  . Hemoglobin A1C 03/21/2013 6.8* <5.7 % Final   Comment:                                                                                                 According to the ADA Clinical Practice Recommendations for 2011, when                          HbA1c is used as a screening test:                                                       >=6.5%   Diagnostic of Diabetes Mellitus                                     (if abnormal result is confirmed)                                                     5.7-6.4%   Increased risk of developing Diabetes Mellitus                                                     References:Diagnosis and Classification of Diabetes Mellitus,Diabetes                          Care,2011,34(Suppl 1):S62-S69 and Standards of Medical Care in                                  Diabetes - 2011,Diabetes Care,2011,34 (Suppl 1):S11-S61.                             . Mean Plasma Glucose 03/21/2013 148* <117 mg/dL Final  . Total Bilirubin 03/21/2013 0.7  0.3 - 1.2 mg/dL Final  . Bilirubin, Direct 03/21/2013 0.2  0.0 - 0.3 mg/dL Final  . Indirect Bilirubin 03/21/2013 0.5  0.0 - 0.9 mg/dL Final  . Alkaline Phosphatase 03/21/2013 197* 39 - 117 U/L Final  . AST 03/21/2013 11  0 - 37 U/L Final  . ALT 03/21/2013 8  0 - 53 U/L Final  . Total Protein 03/21/2013 6.1  6.0 - 8.3 g/dL Final  . Albumin 09/24/2535 3.2* 3.5 - 5.2 g/dL Final  . Pro B Natriuretic peptide (BNP) 03/21/2013 22776.00* <126 pg/mL Final  . Color, Urine  03/21/2013 BROWN* YELLOW Final   Biochemicals may be affected by the color of the urine.  Marland Kitchen APPearance 03/21/2013 TURBID* CLEAR Final  . Specific Gravity, Urine 03/21/2013 1.020  1.005 - 1.030 Final  . pH 03/21/2013 6.0  5.0 - 8.0 Final  . Glucose, UA 03/21/2013 NEG  NEG mg/dL Final  . Bilirubin Urine 03/21/2013 SMALL* NEG Final  . Ketones, ur 03/21/2013 NEG  NEG mg/dL Final  . Hgb urine dipstick 03/21/2013 LARGE* NEG Final  . Protein, ur 03/21/2013 > 300* NEG mg/dL Final  . Urobilinogen, UA 03/21/2013 0.2  0.0 - 1.0 mg/dL Final  . Nitrite 64/40/3474 POS* NEG Final  . Leukocytes, UA 03/21/2013 SMALL* NEG Final  . Culture 03/21/2013 STAPHYLOCOCCUS AUREUS   Final  . Colony Count 03/21/2013 >=100,000 COLONIES/ML   Final  . Organism ID, Bacteria 03/21/2013 STAPHYLOCOCCUS AUREUS   Final   Comment: Rifampin and Gentamicin should not be used as                          single drugs for treatment of Staph infections.  . Squamous Epithelial / LPF 03/21/2013 RARE  RARE Final  . Crystals 03/21/2013 NONE SEEN  NONE SEEN Final  . Casts 03/21/2013 NONE SEEN  NONE SEEN Final  . WBC, UA 03/21/2013 TNTC* <3 WBC/hpf Final  . RBC / HPF 03/21/2013 TNTC* <3 RBC/hpf Final  . Bacteria, UA 03/21/2013 MANY* RARE Final   Comment: TOO TURBID TO SPIN                          SPECIMEN FROM CATH BAG     Annual summary: Hospitalizations:      Infection History:  Functional assessment: Areas of potential improvement: Rehabilitation Potential: Prognosis for survival: Plan:

## 2013-04-12 NOTE — Addendum Note (Signed)
Addended by: Buena Irish A on: 04/12/2013 08:52 AM   Modules accepted: Orders

## 2013-04-13 LAB — CULTURE, BLOOD (ROUTINE X 2)

## 2013-04-25 IMAGING — CR DG LUMBAR SPINE COMPLETE 4+V
5 series · 5 of 5 positions shown · non-contrast
Comparison: 02/03/2013

CLINICAL DATA: Back pain

LUMBAR SPINE - COMPLETE 4+ VIEW

[t lumbar spine ap]
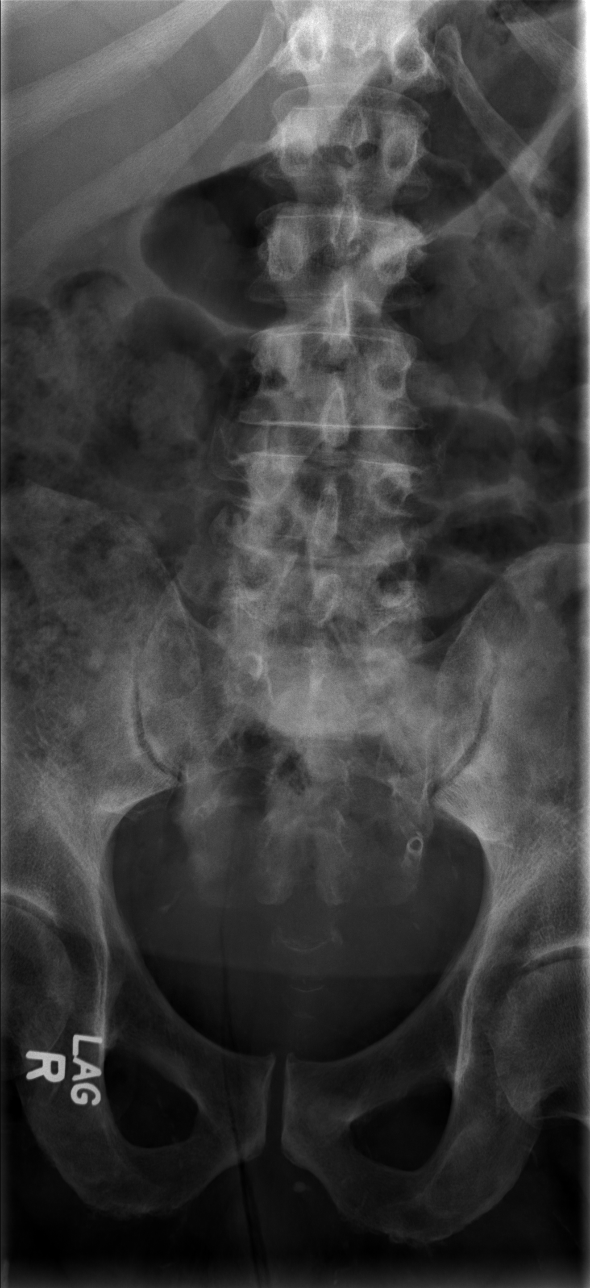

[t lumbar spine obl (1 of 2)]
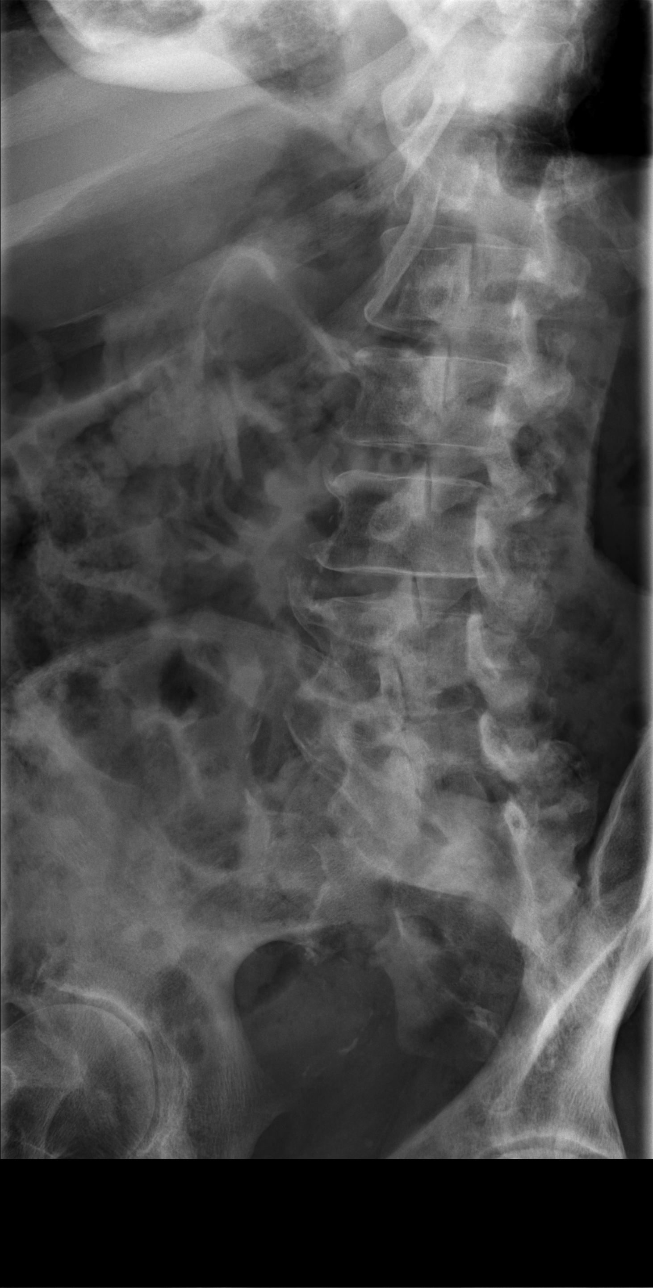

[t lumbar spine obl (2 of 2)]
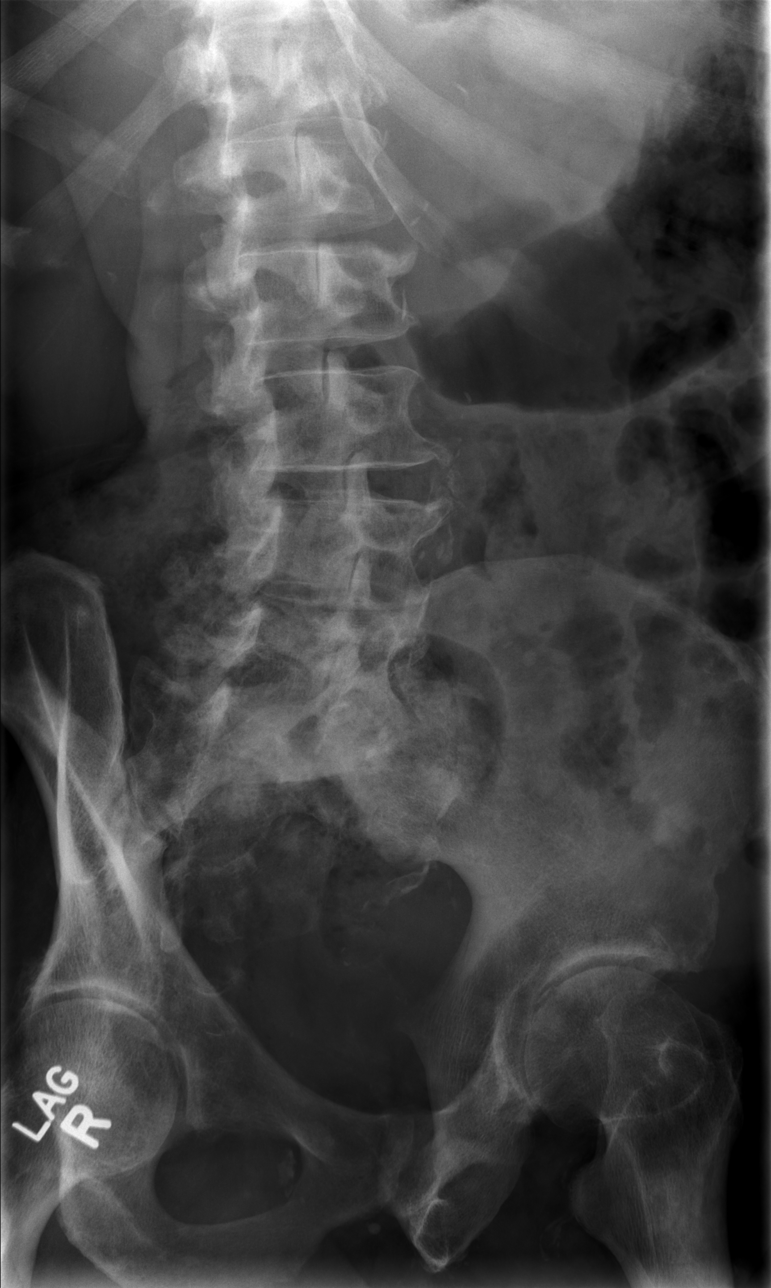

[t lumbar spine lat]
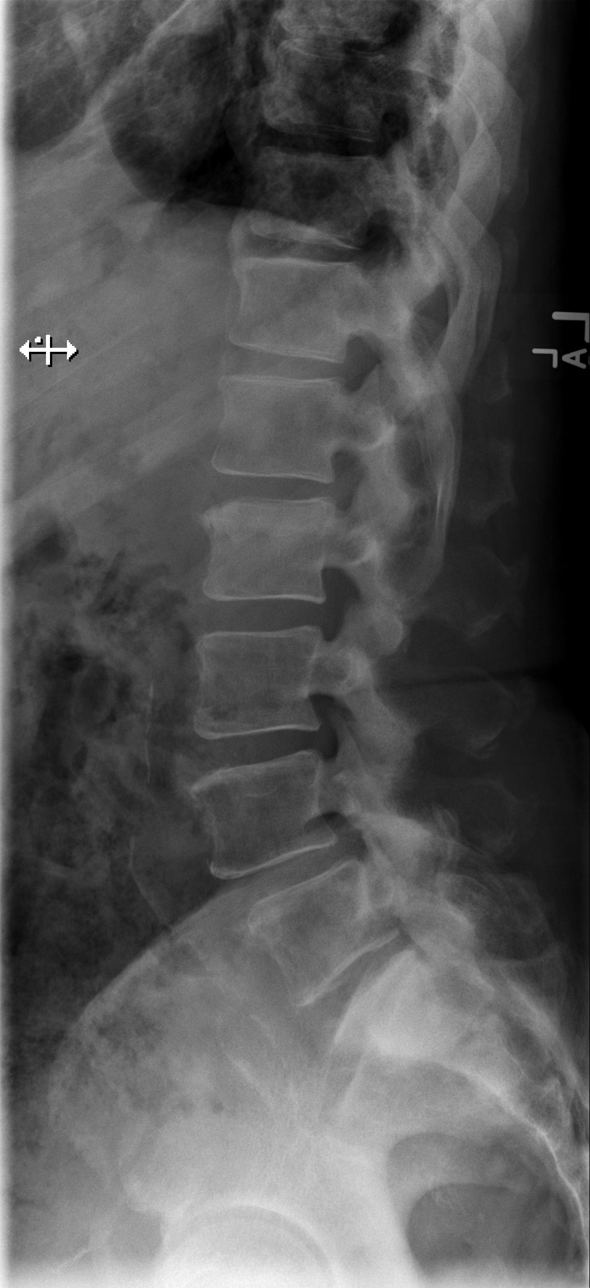

[t lumbar l-5 s-1 spot]
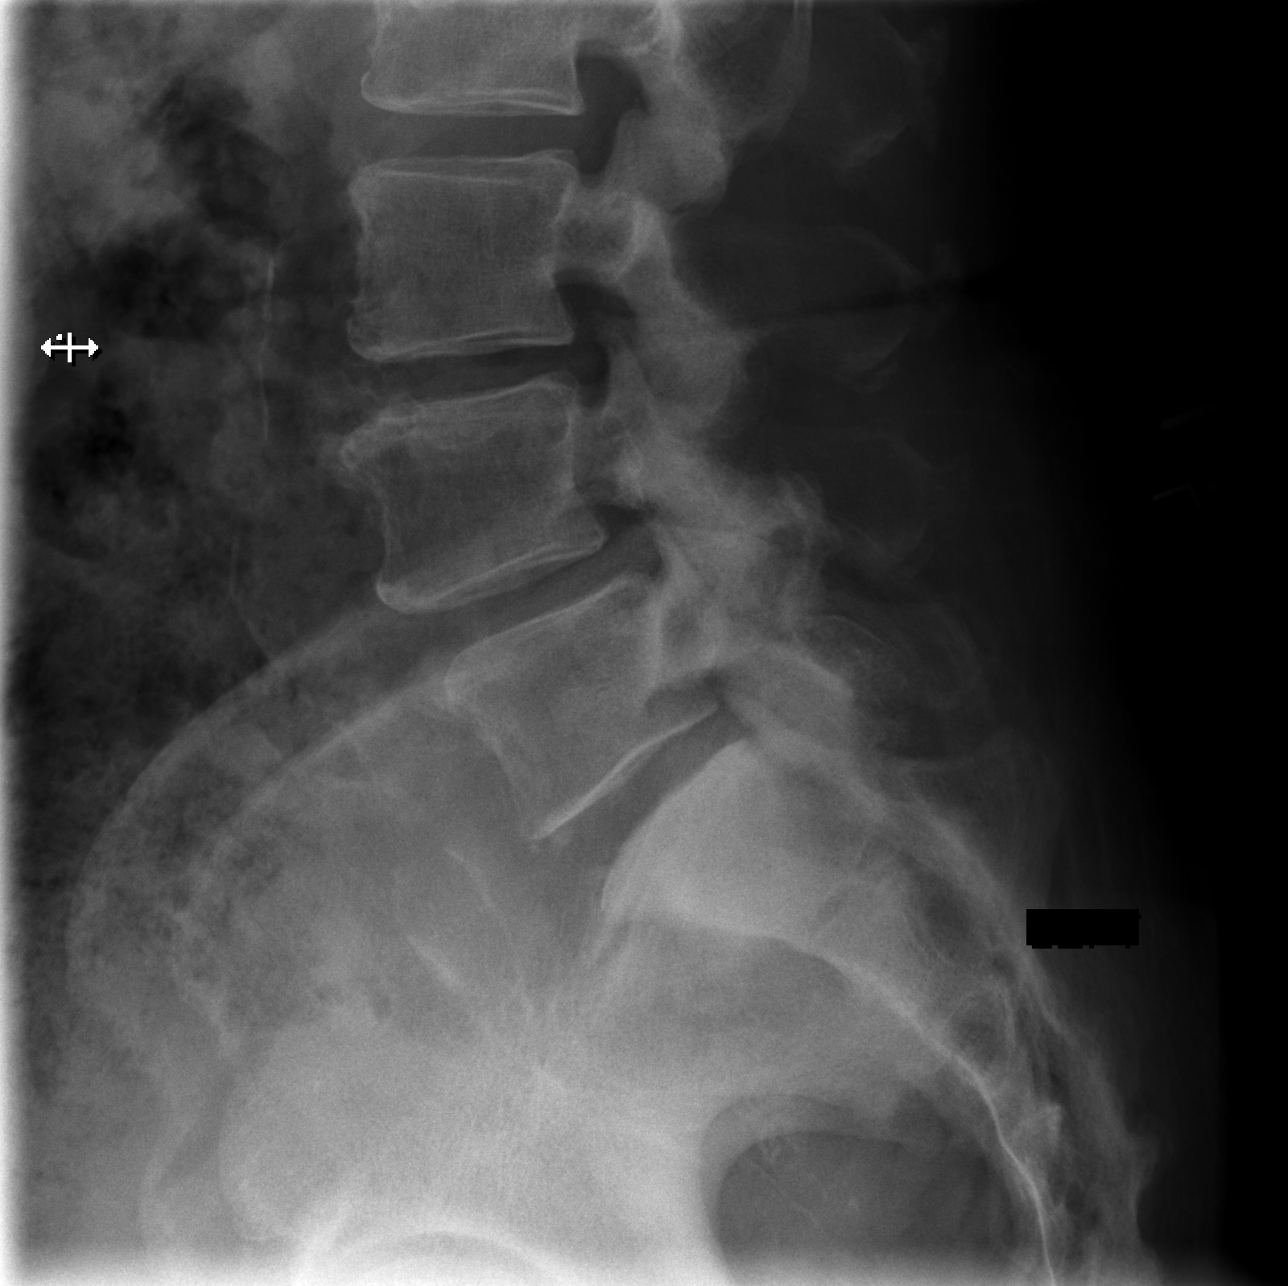

[5 of 5 positions shown; findings below may reference images not displayed]

FINDINGS: Multifocal sclerotic foci throughout the visualized bone.
Increased L2 superior endplate sclerosis and irregularity, may
reflect mild compression deformity.  No retropulsion.
Atherosclerotic vascular calcifications.  Minimal anterolisthesis
of L4 on L5.
IMPRESSION: Sclerotic foci scattered throughout the visualized bones, in
keeping with metastatic disease.

Sclerosis/mild irregularity at the L2 vertebral body superior
endp[REDACTED] reflect mild compression deformity.  No retropulsion.

## 2013-05-02 NOTE — Consult Note (Signed)
Agree with above 

## 2013-05-08 ENCOUNTER — Non-Acute Institutional Stay (SKILLED_NURSING_FACILITY): Payer: Medicare Other | Admitting: Internal Medicine

## 2013-05-08 ENCOUNTER — Encounter: Payer: Self-pay | Admitting: Internal Medicine

## 2013-05-08 DIAGNOSIS — R531 Weakness: Secondary | ICD-10-CM

## 2013-05-08 DIAGNOSIS — N139 Obstructive and reflux uropathy, unspecified: Secondary | ICD-10-CM

## 2013-05-08 DIAGNOSIS — E119 Type 2 diabetes mellitus without complications: Secondary | ICD-10-CM

## 2013-05-08 DIAGNOSIS — C61 Malignant neoplasm of prostate: Secondary | ICD-10-CM

## 2013-05-08 DIAGNOSIS — I5023 Acute on chronic systolic (congestive) heart failure: Secondary | ICD-10-CM

## 2013-05-08 DIAGNOSIS — I509 Heart failure, unspecified: Secondary | ICD-10-CM

## 2013-05-08 DIAGNOSIS — C801 Malignant (primary) neoplasm, unspecified: Secondary | ICD-10-CM

## 2013-05-08 DIAGNOSIS — R5383 Other fatigue: Secondary | ICD-10-CM

## 2013-05-08 DIAGNOSIS — D638 Anemia in other chronic diseases classified elsewhere: Secondary | ICD-10-CM

## 2013-05-08 DIAGNOSIS — R5381 Other malaise: Secondary | ICD-10-CM

## 2013-05-08 NOTE — Progress Notes (Signed)
Date: 05/08/2013  MRN:  [id number] Name:  [name] Sex:  male Age:  78 y.o. DOB:1935-11-20                    Facility/Room:  [name] Level Of Care:  SNF Provider: Kermit Balo, DO, CMD  Emergency Contacts: Contact Information   Name Relation Home Work [place] Daughter   [phone]   Driggers,Jacquelyn Daughter   [phone]     Code Status:  DNR  Allergies: Allergies  Allergen Reactions  . Aspirin Other (See Comments)    "makes my stomach hurt real bad."  . Feldene (Piroxicam) Swelling     Chief Complaint  Patient presents with  . Hospitalization Follow-up    CHF   HPI:  78 yo male transferred from [facility] here s/p hospitalization for CHF.  He c/o dizziness.  He has not been taking his bp meds or diuretics that would cause this.  He has had hematuria.  He has known prostate cancer s/p lupron, casodex, XRT in past and now has bony mets with pain.    Past Medical History  Diagnosis Date  . CAD (coronary artery disease)   . Hypertension   . Obesity   . Dyslipidemia   . Anemia   . H/O hemorrhoids   . H/O: GI bleed   . Rhinitis, allergic   . Fatty liver   . Shoulder dislocation 1970    right  . Myocardial infarction 2007  . History of gout   . Asthma   . Arthritis     "all over my whole body" (03/07/2013)  . Chronic kidney disease (CKD), stage III (moderate)     ckd III  . Carpal tunnel syndrome   . Prostate cancer 02/11/13-02/20/13    metastatic prostate cancer  . Bone metastasis   . Fall at home 04/07/2013  . CHF (congestive heart failure)   . Type II diabetes mellitus   . Foley catheter in place     "Coude" (04/08/2013)    Past Surgical History  Procedure Laterality Date  . Transurethral resection of prostate  06/08/2011, 11/08/06  . Nerve repair Right 1974    "hand" (03/07/2013)  . Cataract extraction w/ intraocular lens  implant, bilateral Bilateral       Current Outpatient Prescriptions  Medication Sig  Dispense Refill  . allopurinol (ZYLOPRIM) 100 MG tablet Take 200 mg by mouth daily.      . bicalutamide (CASODEX) 50 MG tablet Take 50 mg by mouth daily.       . furosemide (LASIX) 40 MG tablet Take 1 tablet (40 mg total) by mouth daily.  30 tablet  0  . hydrALAZINE (APRESOLINE) 25 MG tablet Take 1 tablet by mouth 3 (three) times daily.      . isosorbide mononitrate (IMDUR) 60 MG 24 hr tablet Take 60 mg by mouth daily.       Marland Kitchen losartan (COZAAR) 50 MG tablet Take 1 tablet (50 mg total) by mouth daily.  90 tablet  3  . metoprolol succinate (TOPROL-XL) 25 MG 24 hr tablet Take 25 mg by mouth daily.      . nitroGLYCERIN (NITROSTAT) 0.4 MG SL tablet Place 0.4 mg under the tongue every 5 (five) minutes as needed.        Marland Kitchen oxyCODONE-acetaminophen (PERCOCET) 5-325 MG per tablet Take 1 tablet by mouth every 4 (four) hours as needed for pain.  120 tablet  0  . simvastatin (ZOCOR) 20 MG  tablet Take 20 mg by mouth every evening.      . sitaGLIPtin (JANUVIA) 25 MG tablet Take 1 tablet (25 mg total) by mouth daily with breakfast.  30 tablet  0  . Tamsulosin HCl (FLOMAX) 0.4 MG CAPS Take 0.4 mg by mouth daily.       No current facility-administered medications for this visit.     There is no immunization history on file for this patient.   History  Substance Use Topics  . Smoking status: Former Games developer  . Smokeless tobacco: Never Used     Comment: QUIT "YEARS" AGO  . Alcohol Use: No     Comment: former heavy use    Family History  Problem Relation Age of Onset  . Diabetes Mother      Review of Systems  Constitutional: Positive for weight loss and malaise/fatigue. Negative for fever and chills.  Eyes: Negative for blurred vision.  Respiratory: Positive for shortness of breath. Negative for cough and wheezing.   Cardiovascular: Negative for chest pain and leg swelling.  Gastrointestinal: Negative for abdominal pain, constipation, blood in stool and melena.  Genitourinary: Positive for dysuria  and hematuria. Negative for urgency, frequency and flank pain.  Musculoskeletal: Positive for back pain and joint pain. Negative for falls.  Skin: Negative for rash.  Neurological: Positive for dizziness. Negative for loss of consciousness and headaches.  Endo/Heme/Allergies: Bruises/bleeds easily.  Psychiatric/Behavioral: Positive for depression and memory loss.     Vital signs: BP 109/65  Pulse 75  Temp(Src) 97.2 F (36.2 C)  Resp 20  SpO2 98%  Physical Exam  Constitutional: No distress.  Frail black male, nad  HENT:  Head: Normocephalic and atraumatic.  Right Ear: External ear normal.  Left Ear: External ear normal.  Nose: Nose normal.  Mouth/Throat: Oropharynx is clear and moist. No oropharyngeal exudate.  Eyes: Conjunctivae and EOM are normal. Pupils are equal, round, and reactive to light.  Neck: Normal range of motion. Neck supple.  Cardiovascular: Normal rate, regular rhythm, normal heart sounds and intact distal pulses.   Pulmonary/Chest: Effort normal and breath sounds normal. No respiratory distress.  Abdominal: Soft. Bowel sounds are normal. He exhibits no distension. There is no tenderness.  Musculoskeletal: Normal range of motion. He exhibits edema.  Neurological: He is alert.  Skin: Skin is warm and dry. He is not diaphoretic.   Plan: 1. Prostate cancer, primary, with metastasis from prostate to other site (bone) -consult palliative care due to difficult to control pain and pt does not want further treatments  2. CHF (congestive heart failure), acute on chronic, systolic -was reason for last hospitalization -on fluid restriction due to volume overload previously and NAS diet -monitor weights to prevent recurrent exacerbation  3. DM (diabetes mellitus) -has been well controlled  4. Weakness generalized -due to 1 and 2  5. Anemia of chronic disease -worsened by hematuria due to obstructive uropathy -monitor cbc  6. Obstructive uropathy -has foley in  place -keep urology f/u

## 2013-05-09 ENCOUNTER — Other Ambulatory Visit: Payer: Self-pay | Admitting: *Deleted

## 2013-05-09 MED ORDER — OXYCODONE-ACETAMINOPHEN 5-325 MG PO TABS: ORAL_TABLET | ORAL | Status: DC

## 2013-05-16 ENCOUNTER — Non-Acute Institutional Stay (SKILLED_NURSING_FACILITY): Payer: Medicare Other | Admitting: Internal Medicine

## 2013-05-16 DIAGNOSIS — I509 Heart failure, unspecified: Secondary | ICD-10-CM

## 2013-05-16 DIAGNOSIS — I5022 Chronic systolic (congestive) heart failure: Secondary | ICD-10-CM

## 2013-05-16 DIAGNOSIS — R531 Weakness: Secondary | ICD-10-CM

## 2013-05-16 DIAGNOSIS — C7951 Secondary malignant neoplasm of bone: Secondary | ICD-10-CM

## 2013-05-16 DIAGNOSIS — R5381 Other malaise: Secondary | ICD-10-CM

## 2013-05-16 DIAGNOSIS — E119 Type 2 diabetes mellitus without complications: Secondary | ICD-10-CM

## 2013-05-16 NOTE — Progress Notes (Signed)
Patient ID: [name], male   DOB: [birth year], 78 y.o.   MRN: [id number]  CC- discharge visit  HPI- 78 y/o male patient is here for STR post hospital admission with chf exacerbation. He has history of stage 4 prostate cancer, ckd, chf, dm, anemia, gout and HTN. He has worked with therapy team and is ready to be discharged home. He denies any complaints. No concerns from staff in particular  ROS- No fever or chills No chest pain Breathing is comfortable  Exam- VSS  gen- elderly male in NAD HEENT- no pallor, MMM, no JVD cvs- n s1, s2, rrr respi- cta b/l abdo- bs+, soft, chronic foley present  A/P-  He is stable to be discharged home with home health services- PT/OT/RN, palliative care services. Given his co-morbidities and poor prognosis with the cancer will provide home palliative care consult for family care plan meeting and review goals of care.  Script for his medications provided   HTN- continue cozaar, imdur and metoprolol  chf- continue cozaar, lasix, imdur, metoprolol. Monitor weight  CAD- remains chest pain free. Continue prn NTG with b blcoker, ARB and zocor  Prostate cancer- continue casodex and prn oxycodone-apap for pain. Also provided script for flomax  gerd- continue omeprazole for now  DM- continue [place] and follow with PCP  Gout- continue allopurinol

## 2013-05-22 ENCOUNTER — Emergency Department (HOSPITAL_COMMUNITY): Payer: Medicare Other

## 2013-05-22 ENCOUNTER — Inpatient Hospital Stay (HOSPITAL_COMMUNITY)
Admission: EM | Admit: 2013-05-22 | Discharge: 2013-05-24 | DRG: 690 | Disposition: A | Discharge status: Skilled Nursing Facility | Payer: Medicare Other | Attending: Internal Medicine | Admitting: Internal Medicine

## 2013-05-22 ENCOUNTER — Encounter (HOSPITAL_COMMUNITY): Payer: Self-pay | Admitting: Emergency Medicine

## 2013-05-22 DIAGNOSIS — Z87891 Personal history of nicotine dependence: Secondary | ICD-10-CM

## 2013-05-22 DIAGNOSIS — C61 Malignant neoplasm of prostate: Secondary | ICD-10-CM

## 2013-05-22 DIAGNOSIS — E872 Acidosis, unspecified: Secondary | ICD-10-CM

## 2013-05-22 DIAGNOSIS — D638 Anemia in other chronic diseases classified elsewhere: Secondary | ICD-10-CM

## 2013-05-22 DIAGNOSIS — Z515 Encounter for palliative care: Secondary | ICD-10-CM

## 2013-05-22 DIAGNOSIS — I509 Heart failure, unspecified: Secondary | ICD-10-CM | POA: Diagnosis present

## 2013-05-22 DIAGNOSIS — R109 Unspecified abdominal pain: Secondary | ICD-10-CM | POA: Diagnosis present

## 2013-05-22 DIAGNOSIS — N1330 Unspecified hydronephrosis: Secondary | ICD-10-CM

## 2013-05-22 DIAGNOSIS — I10 Essential (primary) hypertension: Secondary | ICD-10-CM

## 2013-05-22 DIAGNOSIS — C7951 Secondary malignant neoplasm of bone: Secondary | ICD-10-CM

## 2013-05-22 DIAGNOSIS — I2510 Atherosclerotic heart disease of native coronary artery without angina pectoris: Secondary | ICD-10-CM

## 2013-05-22 DIAGNOSIS — N184 Chronic kidney disease, stage 4 (severe): Secondary | ICD-10-CM

## 2013-05-22 DIAGNOSIS — N139 Obstructive and reflux uropathy, unspecified: Secondary | ICD-10-CM

## 2013-05-22 DIAGNOSIS — E669 Obesity, unspecified: Secondary | ICD-10-CM | POA: Diagnosis present

## 2013-05-22 DIAGNOSIS — B958 Unspecified staphylococcus as the cause of diseases classified elsewhere: Secondary | ICD-10-CM | POA: Diagnosis present

## 2013-05-22 DIAGNOSIS — R52 Pain, unspecified: Secondary | ICD-10-CM

## 2013-05-22 DIAGNOSIS — N390 Urinary tract infection, site not specified: Principal | ICD-10-CM

## 2013-05-22 DIAGNOSIS — E119 Type 2 diabetes mellitus without complications: Secondary | ICD-10-CM

## 2013-05-22 DIAGNOSIS — N179 Acute kidney failure, unspecified: Secondary | ICD-10-CM | POA: Diagnosis present

## 2013-05-22 DIAGNOSIS — Z8719 Personal history of other diseases of the digestive system: Secondary | ICD-10-CM

## 2013-05-22 DIAGNOSIS — C7952 Secondary malignant neoplasm of bone marrow: Secondary | ICD-10-CM

## 2013-05-22 DIAGNOSIS — I5022 Chronic systolic (congestive) heart failure: Secondary | ICD-10-CM

## 2013-05-22 DIAGNOSIS — E871 Hypo-osmolality and hyponatremia: Secondary | ICD-10-CM

## 2013-05-22 DIAGNOSIS — Z79899 Other long term (current) drug therapy: Secondary | ICD-10-CM

## 2013-05-22 DIAGNOSIS — E875 Hyperkalemia: Secondary | ICD-10-CM

## 2013-05-22 DIAGNOSIS — Z8739 Personal history of other diseases of the musculoskeletal system and connective tissue: Secondary | ICD-10-CM

## 2013-05-22 DIAGNOSIS — I129 Hypertensive chronic kidney disease with stage 1 through stage 4 chronic kidney disease, or unspecified chronic kidney disease: Secondary | ICD-10-CM | POA: Diagnosis present

## 2013-05-22 DIAGNOSIS — R531 Weakness: Secondary | ICD-10-CM

## 2013-05-22 DIAGNOSIS — N19 Unspecified kidney failure: Secondary | ICD-10-CM

## 2013-05-22 DIAGNOSIS — I219 Acute myocardial infarction, unspecified: Secondary | ICD-10-CM

## 2013-05-22 DIAGNOSIS — E785 Hyperlipidemia, unspecified: Secondary | ICD-10-CM

## 2013-05-22 LAB — CBC WITH DIFFERENTIAL/PLATELET
Basophils Absolute: 0 10*3/uL (ref 0.0–0.1)
HCT: 26.3 % — ABNORMAL LOW (ref 39.0–52.0)
Lymphocytes Relative: 23 % (ref 12–46)
Monocytes Absolute: 0.6 10*3/uL (ref 0.1–1.0)
Neutro Abs: 3.4 10*3/uL (ref 1.7–7.7)
Neutrophils Relative %: 62 % (ref 43–77)
Platelets: 369 10*3/uL (ref 150–400)
RDW: 16 % — ABNORMAL HIGH (ref 11.5–15.5)
WBC: 5.5 10*3/uL (ref 4.0–10.5)

## 2013-05-22 LAB — URINE MICROSCOPIC-ADD ON

## 2013-05-22 LAB — URINALYSIS, ROUTINE W REFLEX MICROSCOPIC
Bilirubin Urine: NEGATIVE
Glucose, UA: NEGATIVE mg/dL
Protein, ur: >300 mg/dL — AB
Urobilinogen, UA: 0.2 mg/dL (ref 0.0–1.0)

## 2013-05-22 LAB — CREATININE, SERUM: GFR calc Af Amer: 32 mL/min — ABNORMAL LOW (ref >90)

## 2013-05-22 LAB — CBC
MCHC: 33.1 g/dL (ref 30.0–36.0)
MCV: 78.9 fL (ref 78.0–100.0)
Platelets: 366 10*3/uL (ref 150–400)
RDW: 16.4 % — ABNORMAL HIGH (ref 11.5–15.5)
WBC: 5.3 10*3/uL (ref 4.0–10.5)

## 2013-05-22 LAB — GLUCOSE, CAPILLARY

## 2013-05-22 LAB — TROPONIN I: Troponin I: <0.3 ng/mL (ref <0.30)

## 2013-05-22 LAB — COMPREHENSIVE METABOLIC PANEL
ALT: 24 U/L (ref 0–53)
AST: 25 U/L (ref 0–37)
CO2: 17 mEq/L — ABNORMAL LOW (ref 19–32)
Chloride: 104 mEq/L (ref 96–112)
GFR calc non Af Amer: 24 mL/min — ABNORMAL LOW (ref >90)
Potassium: 5.8 mEq/L — ABNORMAL HIGH (ref 3.5–5.1)
Sodium: 131 mEq/L — ABNORMAL LOW (ref 135–145)
Total Bilirubin: 0.1 mg/dL — ABNORMAL LOW (ref 0.3–1.2)

## 2013-05-22 LAB — POCT I-STAT TROPONIN I: Troponin i, poc: 0.05 ng/mL (ref 0.00–0.08)

## 2013-05-22 LAB — PRO B NATRIURETIC PEPTIDE: Pro B Natriuretic peptide (BNP): 7750 pg/mL — ABNORMAL HIGH (ref 0–450)

## 2013-05-22 MED ORDER — FUROSEMIDE 40 MG PO TABS
40.0000 mg | ORAL_TABLET | Freq: Every day | ORAL | Status: DC
  Administered 2013-05-23 – 2013-05-24 (×2): 40 mg via ORAL
  Filled 2013-05-22 (×2): qty 1

## 2013-05-22 MED ORDER — DEXTROSE 5 % IV SOLN
1.0000 g | Freq: Once | INTRAVENOUS | Status: AC
  Administered 2013-05-22: 1 g via INTRAVENOUS
  Filled 2013-05-22: qty 10

## 2013-05-22 MED ORDER — METOPROLOL SUCCINATE ER 25 MG PO TB24
25.0000 mg | ORAL_TABLET | Freq: Every day | ORAL | Status: DC
  Administered 2013-05-22 – 2013-05-24 (×3): 25 mg via ORAL
  Filled 2013-05-22 (×3): qty 1

## 2013-05-22 MED ORDER — ENOXAPARIN SODIUM 30 MG/0.3ML ~~LOC~~ SOLN
30.0000 mg | SUBCUTANEOUS | Status: DC
  Filled 2013-05-22 (×3): qty 0.3

## 2013-05-22 MED ORDER — SODIUM CHLORIDE 0.9 % IV SOLN: INTRAVENOUS | Status: DC

## 2013-05-22 MED ORDER — TAMSULOSIN HCL 0.4 MG PO CAPS
0.4000 mg | ORAL_CAPSULE | Freq: Every day | ORAL | Status: DC
  Administered 2013-05-22 – 2013-05-24 (×3): 0.4 mg via ORAL
  Filled 2013-05-22 (×3): qty 1

## 2013-05-22 MED ORDER — ONDANSETRON HCL 4 MG/2ML IJ SOLN: 4.0000 mg | Freq: Three times a day (TID) | INTRAMUSCULAR | Status: DC | PRN

## 2013-05-22 MED ORDER — INSULIN ASPART 100 UNIT/ML IV SOLN: 10.0000 [IU] | Freq: Once | INTRAVENOUS | Status: DC

## 2013-05-22 MED ORDER — BICALUTAMIDE 50 MG PO TABS
50.0000 mg | ORAL_TABLET | Freq: Every day | ORAL | Status: DC
  Administered 2013-05-22 – 2013-05-24 (×3): 50 mg via ORAL
  Filled 2013-05-22 (×3): qty 1

## 2013-05-22 MED ORDER — DEXTROSE 5 % IV SOLN
1.0000 g | Freq: Every day | INTRAVENOUS | Status: DC
  Filled 2013-05-22: qty 10

## 2013-05-22 MED ORDER — PANTOPRAZOLE SODIUM 40 MG PO TBEC
40.0000 mg | DELAYED_RELEASE_TABLET | Freq: Two times a day (BID) | ORAL | Status: DC
  Administered 2013-05-22 – 2013-05-24 (×5): 40 mg via ORAL
  Filled 2013-05-22 (×5): qty 1

## 2013-05-22 MED ORDER — LINAGLIPTIN 5 MG PO TABS
5.0000 mg | ORAL_TABLET | Freq: Every day | ORAL | Status: DC
  Administered 2013-05-22 – 2013-05-24 (×3): 5 mg via ORAL
  Filled 2013-05-22 (×3): qty 1

## 2013-05-22 MED ORDER — IOHEXOL 300 MG/ML  SOLN
25.0000 mL | INTRAMUSCULAR | Status: AC
  Administered 2013-05-22 (×2): 25 mL via ORAL

## 2013-05-22 MED ORDER — OXYCODONE-ACETAMINOPHEN 5-325 MG PO TABS: 1.0000 | ORAL_TABLET | Freq: Four times a day (QID) | ORAL | Status: DC | PRN

## 2013-05-22 MED ORDER — SODIUM POLYSTYRENE SULFONATE 15 GM/60ML PO SUSP
30.0000 g | Freq: Once | ORAL | Status: AC
  Administered 2013-05-22: 30 g via ORAL
  Filled 2013-05-22: qty 120

## 2013-05-22 MED ORDER — INSULIN ASPART 100 UNIT/ML ~~LOC~~ SOLN: 0.0000 [IU] | Freq: Three times a day (TID) | SUBCUTANEOUS | Status: DC

## 2013-05-22 MED ORDER — BISACODYL 10 MG RE SUPP: 10.0000 mg | Freq: Every day | RECTAL | Status: DC | PRN

## 2013-05-22 MED ORDER — ACETAMINOPHEN 650 MG RE SUPP: 650.0000 mg | Freq: Four times a day (QID) | RECTAL | Status: DC | PRN

## 2013-05-22 MED ORDER — ISOSORBIDE MONONITRATE ER 60 MG PO TB24
60.0000 mg | ORAL_TABLET | Freq: Every day | ORAL | Status: DC
  Administered 2013-05-22 – 2013-05-24 (×3): 60 mg via ORAL
  Filled 2013-05-22 (×3): qty 1

## 2013-05-22 MED ORDER — HYDROMORPHONE HCL PF 1 MG/ML IJ SOLN
1.0000 mg | INTRAMUSCULAR | Status: DC | PRN
  Filled 2013-05-22: qty 1

## 2013-05-22 MED ORDER — SODIUM CHLORIDE 0.9 % IV BOLUS (SEPSIS)
500.0000 mL | Freq: Once | INTRAVENOUS | Status: AC
  Administered 2013-05-22: 500 mL via INTRAVENOUS

## 2013-05-22 MED ORDER — ONDANSETRON HCL 4 MG PO TABS: 4.0000 mg | ORAL_TABLET | Freq: Four times a day (QID) | ORAL | Status: DC | PRN

## 2013-05-22 MED ORDER — ACETAMINOPHEN 325 MG PO TABS: 650.0000 mg | ORAL_TABLET | Freq: Four times a day (QID) | ORAL | Status: DC | PRN

## 2013-05-22 MED ORDER — NITROGLYCERIN 0.4 MG SL SUBL: 0.4000 mg | SUBLINGUAL_TABLET | SUBLINGUAL | Status: DC | PRN

## 2013-05-22 MED ORDER — ONDANSETRON HCL 4 MG/2ML IJ SOLN
4.0000 mg | Freq: Once | INTRAMUSCULAR | Status: AC
  Administered 2013-05-22: 4 mg via INTRAVENOUS
  Filled 2013-05-22: qty 2

## 2013-05-22 MED ORDER — VANCOMYCIN HCL 500 MG IV SOLR
500.0000 mg | INTRAVENOUS | Status: DC
  Administered 2013-05-22 – 2013-05-23 (×2): 500 mg via INTRAVENOUS
  Filled 2013-05-22 (×4): qty 500

## 2013-05-22 MED ORDER — ALLOPURINOL 100 MG PO TABS
100.0000 mg | ORAL_TABLET | Freq: Every day | ORAL | Status: DC
  Administered 2013-05-22 – 2013-05-24 (×3): 100 mg via ORAL
  Filled 2013-05-22 (×4): qty 1

## 2013-05-22 MED ORDER — SODIUM CHLORIDE 0.9 % IJ SOLN: 3.0000 mL | Freq: Two times a day (BID) | INTRAMUSCULAR | Status: DC

## 2013-05-22 MED ORDER — SODIUM CHLORIDE 0.9 % IV SOLN
INTRAVENOUS | Status: AC
  Administered 2013-05-22: 21:00:00 via INTRAVENOUS

## 2013-05-22 MED ORDER — ONDANSETRON HCL 4 MG/2ML IJ SOLN: 4.0000 mg | Freq: Four times a day (QID) | INTRAMUSCULAR | Status: DC | PRN

## 2013-05-22 MED ORDER — SIMVASTATIN 20 MG PO TABS
20.0000 mg | ORAL_TABLET | Freq: Every evening | ORAL | Status: DC
  Administered 2013-05-22 – 2013-05-24 (×3): 20 mg via ORAL
  Filled 2013-05-22 (×3): qty 1

## 2013-05-22 NOTE — ED Provider Notes (Signed)
Complains of abdominal pain for several weeks. Patient is uncertain when he had last bowel movement. On exam patient is chronically ill-appearing mucous memory and dry lungs clear auscultation heart regular in rhythm abdomen nondistended minimal diffuse tenderness. Patient noted to be hyperkalemic and worsening renal failure. Clinically appears mildly dehydrated  Doug Sou, MD 05/22/13 [phone]

## 2013-05-22 NOTE — ED Provider Notes (Signed)
History    CSN: [id number] Arrival date & time 05/22/13  1054  First MD Initiated Contact with Patient 05/22/13 1118     Chief Complaint  Patient presents with  . Abdominal Pain   (Consider location/radiation/quality/duration/timing/severity/associated sxs/prior Treatment) The history is provided by the patient and medical records.    Patient with PMH significant for CAD, HTN, HLP, fatty liver, MI (2007), asthma, CKD, prostate CA, CHF, DM2, presents to the ED from [facility] for abdominal pain. Pain described as a generalized, aching, cramping sensation associated with nausea. Patient denies any vomiting or diarrhea. BM normal and non-bloody. Poor appetite and decreased PO intake.  He also complains of intermittent episodes of chest pain earlier today. Pain not associated with shortness of breath, palpitations, dizziness, weakness or altered mental status. Currently has prostate CA with mets to bone.  CKD- indwelling foley in place.  No recent fevers, sweats, or chills.  Pt notes he has been in and out of rehab for the past few months without significant improvement of his conditions.     Past Medical History  Diagnosis Date  . CAD (coronary artery disease)   . Hypertension   . Obesity   . Dyslipidemia   . Anemia   . H/O hemorrhoids   . H/O: GI bleed   . Rhinitis, allergic   . Fatty liver   . Shoulder dislocation 1970    right  . Myocardial infarction 2007  . History of gout   . Asthma   . Arthritis     "all over my whole body" (03/07/2013)  . Chronic kidney disease (CKD), stage III (moderate)     ckd III  . Carpal tunnel syndrome   . Prostate cancer 02/11/13-02/20/13    metastatic prostate cancer  . Bone metastasis   . Fall at home 04/07/2013  . CHF (congestive heart failure)   . Type II diabetes mellitus   . Foley catheter in place     "Coude" (04/08/2013)   Past Surgical History  Procedure Laterality Date  . Transurethral resection of prostate  06/08/2011, 11/08/06   . Nerve repair Right 1974    "hand" (03/07/2013)  . Cataract extraction w/ intraocular lens  implant, bilateral Bilateral    Family History  Problem Relation Age of Onset  . Diabetes Mother    History  Substance Use Topics  . Smoking status: Former Games developer  . Smokeless tobacco: Never Used     Comment: QUIT "YEARS" AGO  . Alcohol Use: No     Comment: former heavy use    Review of Systems  Cardiovascular: Positive for chest pain.  Gastrointestinal: Positive for nausea and abdominal pain.  All other systems reviewed and are negative.    Allergies  Aspirin and Feldene  Home Medications   Current Outpatient Rx  Name  Route  Sig  Dispense  Refill  . allopurinol (ZYLOPRIM) 100 MG tablet   Oral   Take 200 mg by mouth daily.         . bicalutamide (CASODEX) 50 MG tablet   Oral   Take 50 mg by mouth daily.          . furosemide (LASIX) 40 MG tablet   Oral   Take 1 tablet (40 mg total) by mouth daily.   30 tablet   0   . hydrALAZINE (APRESOLINE) 25 MG tablet   Oral   Take 1 tablet by mouth 3 (three) times daily.         Marland Kitchen  isosorbide mononitrate (IMDUR) 60 MG 24 hr tablet   Oral   Take 60 mg by mouth daily.          Marland Kitchen losartan (COZAAR) 50 MG tablet   Oral   Take 1 tablet (50 mg total) by mouth daily.   90 tablet   3   . metoprolol succinate (TOPROL-XL) 25 MG 24 hr tablet   Oral   Take 25 mg by mouth daily.         . nitroGLYCERIN (NITROSTAT) 0.4 MG SL tablet   Sublingual   Place 0.4 mg under the tongue every 5 (five) minutes as needed.           Marland Kitchen oxyCODONE-acetaminophen (PERCOCET) 5-325 MG per tablet      Take one tablet every 4 hours as needed for pain   180 tablet   0   . simvastatin (ZOCOR) 20 MG tablet   Oral   Take 20 mg by mouth every evening.         . sitaGLIPtin (JANUVIA) 25 MG tablet   Oral   Take 1 tablet (25 mg total) by mouth daily with breakfast.   30 tablet   0   . Tamsulosin HCl (FLOMAX) 0.4 MG CAPS   Oral   Take  0.4 mg by mouth daily.          BP 143/71  Pulse 87  Temp(Src) 98 F (36.7 C) (Oral)  Resp 16  SpO2 100%  Physical Exam  Nursing note and vitals reviewed. Constitutional: He is oriented to person, place, and time. He appears well-developed and well-nourished.  HENT:  Head: Normocephalic and atraumatic.  Mouth/Throat: Uvula is midline and oropharynx is clear and moist.  Mildly dry mucus membranes  Eyes: Conjunctivae and EOM are normal. Pupils are equal, round, and reactive to light.  Neck: Normal range of motion. Neck supple.  Cardiovascular: Normal rate, regular rhythm and normal heart sounds.   Pulmonary/Chest: Effort normal and breath sounds normal. No respiratory distress. He has no wheezes.  Abdominal: Soft. Bowel sounds are normal. There is generalized tenderness. There is no guarding, no CVA tenderness, no tenderness at McBurney's point and negative Murphy's sign.  Genitourinary:  Indwelling foley catheter in place  Musculoskeletal: Normal range of motion. He exhibits no edema.  Neurological: He is alert and oriented to person, place, and time. He has normal strength. No cranial nerve deficit or sensory deficit.  CN grossly intact, moves all extremities appropriately, no acute neuro deficits or facial droop appreciated  Skin: Skin is warm and dry.  Psychiatric: He has a normal mood and affect. His speech is normal.  Speech normal and goal oriented    ED Course  Procedures (including critical care time)   Date: 05/22/2013  Rate: 82  Rhythm: normal sinus rhythm and premature atrial contractions (PAC)  QRS Axis: normal  Intervals: normal  ST/T Wave abnormalities: normal  Conduction Disutrbances:nonspecific intraventricular conduction delay  Narrative Interpretation: NSR, LVH, no STEMI  Old EKG Reviewed: unchanged   Labs Reviewed  CBC WITH DIFFERENTIAL - Abnormal; Notable for the following:    RBC 3.38 (*)    Hemoglobin 8.8 (*)    HCT 26.3 (*)    MCV 77.8 (*)     RDW 16.0 (*)    All other components within normal limits  COMPREHENSIVE METABOLIC PANEL - Abnormal; Notable for the following:    Sodium 131 (*)    Potassium 5.8 (*)    CO2 17 (*)  Glucose, Bld 166 (*)    BUN 47 (*)    Creatinine, Ser 2.44 (*)    Albumin 3.0 (*)    Alkaline Phosphatase 717 (*)    Total Bilirubin 0.1 (*)    GFR calc non Af Amer 24 (*)    GFR calc Af Amer 28 (*)    All other components within normal limits  URINALYSIS, ROUTINE W REFLEX MICROSCOPIC - Abnormal; Notable for the following:    Color, Urine AMBER (*)    APPearance TURBID (*)    Hgb urine dipstick LARGE (*)    Protein, ur >300 (*)    Nitrite POSITIVE (*)    Leukocytes, UA LARGE (*)    All other components within normal limits  LIPASE, BLOOD - Abnormal; Notable for the following:    Lipase 65 (*)    All other components within normal limits  PRO B NATRIURETIC PEPTIDE - Abnormal; Notable for the following:    Pro B Natriuretic peptide (BNP) 7750.0 (*)    All other components within normal limits  CBC - Abnormal; Notable for the following:    RBC 3.37 (*)    Hemoglobin 8.8 (*)    HCT 26.6 (*)    RDW 16.4 (*)    All other components within normal limits  CREATININE, SERUM - Abnormal; Notable for the following:    Creatinine, Ser 2.20 (*)    GFR calc non Af Amer 28 (*)    GFR calc Af Amer 32 (*)    All other components within normal limits  BASIC METABOLIC PANEL - Abnormal; Notable for the following:    Sodium 134 (*)    Potassium 5.9 (*)    CO2 17 (*)    Glucose, Bld 126 (*)    BUN 44 (*)    Creatinine, Ser 2.18 (*)    GFR calc non Af Amer 28 (*)    GFR calc Af Amer 32 (*)    All other components within normal limits  GLUCOSE, CAPILLARY - Abnormal; Notable for the following:    Glucose-Capillary 121 (*)    All other components within normal limits  URINE CULTURE  URINE MICROSCOPIC-ADD ON  TROPONIN I  GLUCOSE, CAPILLARY  BASIC METABOLIC PANEL  CBC  TROPONIN I  TROPONIN I  CG4  I-STAT (LACTIC ACID)  POCT I-STAT TROPONIN I   Ct Abdomen Pelvis Wo Contrast  05/22/2013   *RADIOLOGY REPORT*  Clinical Data: Abdominal pain, left lower quadrant pain, history prostate cancer with osseous metastases, chronic kidney disease, hypertension, coronary artery disease post MI, asthma, CHF, diabetes  CT ABDOMEN AND PELVIS WITHOUT CONTRAST  Technique:  Multidetector CT imaging of the abdomen and pelvis was performed following the standard protocol without intravenous contrast. Patient drank dilute oral contrast for study.  Sagittal and coronal MPR images reconstructed from axial data set.  Comparison: 01/1991 1014  Findings: Atelectasis at lung bases. Scattered peribronchial thickening. Focal area of opacity at the medial right lower lobe may represent persistent infiltrate though mass is not excluded, area measuring 14 x 11 mm image 7. Tiny nonobstructing calculus mid right kidney image 23. Bilateral hydronephrosis and hydroureter. Marked abnormal soft tissue is identified at the prostate bed bladder base, question prostatic enlargement though cannot exclude prostate cancer or bladder cancer, area overall measuring 8.9 x 8.1 x 5.6 cm. Foley catheter within urinary bladder. Remaining bladder wall appears thickened.  Within limits of a nonenhanced exam no focal abnormalities of the liver, spleen, pancreas or adrenal glands. Normal  appendix. Stomach and bowel loops grossly unremarkable. Scattered atherosclerotic calcifications without aortic aneurysm. No mass, free fluid, or free air. Slightly prominent inguinal canals containing fat. Enlarged left periaortic lymph node 2.0 x 1.5 cm image 26. Numerous sclerotic foci are identified throughout the lumbar spine, pelvis and proximal femora consistent with widespread osseous metastatic disease.  IMPRESSION: Bilateral hydronephrosis secondary to marked enlargement of the prostate gland, cannot exclude prostate cancer or cancer arising from the base of the  urinary bladder. Slight increase in size of a left periaortic lymph node. Question focus of atelectasis or scarring at medial right lower lobe though mass/nodule not excluded 14 x 11 mm, recommend attention on follow-up exams. Extensive osseous metastatic disease.   Original Report Authenticated By: Ulyses Southward, M.D.   Dg Chest 2 View  05/22/2013   *RADIOLOGY REPORT*  Clinical Data: Abdominal pain.  CHEST - 2 VIEW  Comparison: 03/07/2013.  Findings: Trachea is midline.  Heart size normal.  Probable mild bibasilar scarring.  Lungs are otherwise clear.  No pleural fluid.  IMPRESSION: Probable bibasilar scarring.   Original Report Authenticated By: Leanna Battles, M.D.   1. Hyperkalemia   2. Hyponatremia   3. Renal failure   4. UTI (lower urinary tract infection)   5. CAD (coronary artery disease)   6. HTN (hypertension)   7. Hyperlipidemia   8. Prostate cancer   9. DM2 (diabetes mellitus, type 2)   10. Anemia of chronic disease   11. CKD (chronic kidney disease) stage 4, GFR 15-29 ml/min   12. Hypertension     MDM   EKG NSR, no acute ischemic changes.  Trop negative.  CXR stable as compared to previous.  CT with bilateral hydro secondary to prostate CA, no acute abdominal findings.  U/a with gross infection (indwelling foley), culture pending.  Labs as above- hyperkalemia, hyponatremia, worsening renal failure (2.44, increased from 2.27 on 5/14).  Pt needs IV rehydration and tx for kyperkalemia but has been refusing.    Dr. Ethelda Chick evaluated pt and advised hospital admission to monitor improvement of multiple medical conditions, tx of UTI, and rehydration.  Pt now agreeable to this- IVF and Rocephin started in the ED.  Kayexalate given for hyperkalemia.  Consulted hospitalist, Dr. Suanne Marker, pt will be admitted to telemetry.  VS stable for transfer upstairs.      Garlon Hatchet, PA-C 05/23/13 [id number]

## 2013-05-22 NOTE — Discharge Summary (Signed)
[facility] History and Physical  [name] AVW:[id number] DOB: 1935-08-30 DOA: 05/22/2013  Referring physician: Dr. Ethelda Chick PCP: Leo Grosser, MD  Specialists:  Chief Complaint: Abdominal pain  HPI: [name] is a 78 y.o. male [facility] with multiple medical problems, including stage IV prostate cancer, recurrent UTIs with chronic indwelling Foley catheter,chronic kidney disease, congestive heart failure, diabetes, hypertension, gout, anemia,who was brought to the emergency room via EMS with complaints of abdominal pain for the past 2 weeks. He states that the pain as all over his abdomen and he is unable to further describe. he denies of fevers, and admits to pain with urination with his cath Foley catheter in place. He states he has had intermittent hematuria for a while and she was to followup with Dr. Lynnae Sandhoff. He denies nausea vomiting, diarrhea, melena and no hematochezia. He denies constipation stating that he had a bowel movement today. In the ED had a urinalysis which was nitrite positive large leukocytes and with wbc's too numerous to count and rare bacteria. A CT scan of his abdomen and pelvis were done which showed bilateral hydronephrosis secondary to marked enlargement of prostate gland, normal appendix and otherwise no acute abdominal findings. Electrolytes revealed a potassium of 5.8, creatinine of 2.44(up from 2.2 in May 2014). He was given Kayexalate, started on empiric antibiotics and is admitted for further evaluation and management. His last urine culture grew staph aureus resistant to penicillin (but sensitive to oxacillin)    Review of Systems: The patient denies anorexia, fever, weight loss,, vision loss, decreased hearing, hoarseness, chest pain, syncope, dyspnea on exertion, peripheral edema, balance deficits, hemoptysis,, melena, hematochezia, severe indigestion/heartburn, genital sores, muscle weakness,   Past Medical History   Diagnosis Date  . CAD (coronary artery disease)   . Hypertension   . Obesity   . Dyslipidemia   . Anemia   . H/O hemorrhoids   . H/O: GI bleed   . Rhinitis, allergic   . Fatty liver   . Shoulder dislocation 1970    right  . Myocardial infarction 2007  . History of gout   . Asthma   . Arthritis     "all over my whole body" (03/07/2013)  . Chronic kidney disease (CKD), stage III (moderate)     ckd III  . Carpal tunnel syndrome   . Prostate cancer 02/11/13-02/20/13    metastatic prostate cancer  . Bone metastasis   . Fall at home 04/07/2013  . CHF (congestive heart failure)   . Type II diabetes mellitus   . Foley catheter in place     "Coude" (04/08/2013)   Past Surgical History  Procedure Laterality Date  . Transurethral resection of prostate  06/08/2011, 11/08/06  . Nerve repair Right 1974    "hand" (03/07/2013)  . Cataract extraction w/ intraocular lens  implant, bilateral Bilateral    Social History:  reports that he has quit smoking. He has never used smokeless tobacco. He reports that he does not drink alcohol or use illicit drugs. where does patient live-- SNF   Allergies  Allergen Reactions  . Aspirin Other (See Comments)    "makes my stomach hurt real bad."  . Feldene (Piroxicam) Swelling    Family History  Problem Relation Age of Onset  . Diabetes Mother     Prior to Admission medications   Medication Sig Start Date End Date Taking? Authorizing Provider  allopurinol (ZYLOPRIM) 100 MG tablet Take 200 mg by mouth daily.   Yes Historical Provider,  MD  bicalutamide (CASODEX) 50 MG tablet Take 50 mg by mouth daily.  03/12/13  Yes Historical Provider, MD  furosemide (LASIX) 40 MG tablet Take 1 tablet (40 mg total) by mouth daily. 04/11/13  Yes Richarda Overlie, MD  isosorbide mononitrate (IMDUR) 60 MG 24 hr tablet Take 60 mg by mouth daily.  03/12/13  Yes Historical Provider, MD  losartan (COZAAR) 50 MG tablet Take 1 tablet (50 mg total) by mouth daily. 03/21/13  Yes  Donita Brooks, MD  metoprolol succinate (TOPROL-XL) 25 MG 24 hr tablet Take 25 mg by mouth daily.   Yes Historical Provider, MD  nitroGLYCERIN (NITROSTAT) 0.4 MG SL tablet Place 0.4 mg under the tongue every 5 (five) minutes as needed for chest pain.    Yes Historical Provider, MD  omeprazole (PRILOSEC) 40 MG capsule Take 40 mg by mouth daily.   Yes Historical Provider, MD  oxyCODONE-acetaminophen (PERCOCET/ROXICET) 5-325 MG per tablet Take 1 tablet by mouth every 4 (four) hours as needed for pain.   Yes Historical Provider, MD  simvastatin (ZOCOR) 20 MG tablet Take 20 mg by mouth every evening.   Yes Historical Provider, MD  sitaGLIPtin (JANUVIA) 25 MG tablet Take 1 tablet (25 mg total) by mouth daily with breakfast. 05/27/12 05/27/13 Yes Otilio Groleau C Brielle Moro, MD  Tamsulosin HCl (FLOMAX) 0.4 MG CAPS Take 0.4 mg by mouth daily.   Yes Historical Provider, MD  tuberculin (APLISOL) 5 UNIT/0.1ML injection Inject 5 Units into the skin See admin instructions. Patient received 0.78ml on 6/23, is due one more dose of 0.52ml on 6/25   Yes Historical Provider, MD   Physical Exam: Filed Vitals:   05/22/13 1327 05/22/13 1345 05/22/13 1415 05/22/13 1700  BP: 144/73 150/74 110/86 152/75  Pulse: 88 86 89 88  Temp:      TempSrc:      Resp: 16 20 20    SpO2: 100% 100% 100% 100%    Constitutional: Vital signs reviewed.  Patient is a well-developed and well-nourished  in no acute distress and cooperative with exam. Alert and oriented x3.  Head: Normocephalic and atraumatic Nose: No erythema or drainage noted.   Mouth: no erythema or exudates, slightly dry MM Eyes: PERRL, EOMI, conjunctivae normal, No scleral icterus.  Neck: Supple, Trachea midline normal ROM, No JVD, mass, thyromegaly, or carotid bruit present.  Cardiovascular: RRR, S1 normal, S2 normal, no MRG, pulses symmetric and intact bilaterally Pulmonary/Chest: normal respiratory effort, CTAB, no wheezes, rales, or rhonchi Abdominal: Soft. Non-tender,  non-distended, bowel sounds are normal, no masses, organomegaly, or guarding present.  GU: no CVA tenderness, chronic indwelling catheter present with gross hematuria   Extremities: No cyanosis and no edema  Neurological: A&O x3, Strength is normal and symmetric bilaterally, cranial nerve II-XII are grossly intact, no focal motor deficit, sensory intact to light touch bilaterally.  Skin: Warm, dry and intact. No rash Psychiatric: Normal mood and affect. speech and behavior is normal.    Labs on Admission:  Basic Metabolic Panel:  Recent Labs Lab 05/22/13 1225  NA 131*  K 5.8*  CL 104  CO2 17*  GLUCOSE 166*  BUN 47*  CREATININE 2.44*  CALCIUM 9.2   Liver Function Tests:  Recent Labs Lab 05/22/13 1225  AST 25  ALT 24  ALKPHOS 717*  BILITOT 0.1*  PROT 7.0  ALBUMIN 3.0*    Recent Labs Lab 05/22/13 1225  LIPASE 65*   No results found for this basename: AMMONIA,  in the last 168 hours CBC:  Recent Labs Lab 05/22/13 1225  WBC 5.5  NEUTROABS 3.4  HGB 8.8*  HCT 26.3*  MCV 77.8*  PLT 369   Cardiac Enzymes: No results found for this basename: CKTOTAL, CKMB, CKMBINDEX, TROPONINI,  in the last 168 hours  BNP (last 3 results)  Recent Labs  03/07/13 1010 03/21/13 1425 05/22/13 1226  PROBNP 22646.0* 22776.00* 7750.0*   CBG: No results found for this basename: GLUCAP,  in the last 168 hours  Radiological Exams on Admission: Ct Abdomen Pelvis Wo Contrast  05/22/2013   *RADIOLOGY REPORT*  Clinical Data: Abdominal pain, left lower quadrant pain, history prostate cancer with osseous metastases, chronic kidney disease, hypertension, coronary artery disease post MI, asthma, CHF, diabetes  CT ABDOMEN AND PELVIS WITHOUT CONTRAST  Technique:  Multidetector CT imaging of the abdomen and pelvis was performed following the standard protocol without intravenous contrast. Patient drank dilute oral contrast for study.  Sagittal and coronal MPR images reconstructed from axial  data set.  Comparison: 01/1991 1014  Findings: Atelectasis at lung bases. Scattered peribronchial thickening. Focal area of opacity at the medial right lower lobe may represent persistent infiltrate though mass is not excluded, area measuring 14 x 11 mm image 7. Tiny nonobstructing calculus mid right kidney image 23. Bilateral hydronephrosis and hydroureter. Marked abnormal soft tissue is identified at the prostate bed bladder base, question prostatic enlargement though cannot exclude prostate cancer or bladder cancer, area overall measuring 8.9 x 8.1 x 5.6 cm. Foley catheter within urinary bladder. Remaining bladder wall appears thickened.  Within limits of a nonenhanced exam no focal abnormalities of the liver, spleen, pancreas or adrenal glands. Normal appendix. Stomach and bowel loops grossly unremarkable. Scattered atherosclerotic calcifications without aortic aneurysm. No mass, free fluid, or free air. Slightly prominent inguinal canals containing fat. Enlarged left periaortic lymph node 2.0 x 1.5 cm image 26. Numerous sclerotic foci are identified throughout the lumbar spine, pelvis and proximal femora consistent with widespread osseous metastatic disease.  IMPRESSION: Bilateral hydronephrosis secondary to marked enlargement of the prostate gland, cannot exclude prostate cancer or cancer arising from the base of the urinary bladder. Slight increase in size of a left periaortic lymph node. Question focus of atelectasis or scarring at medial right lower lobe though mass/nodule not excluded 14 x 11 mm, recommend attention on follow-up exams. Extensive osseous metastatic disease.   Original Report Authenticated By: Ulyses Southward, M.D.   Dg Chest 2 View  05/22/2013   *RADIOLOGY REPORT*  Clinical Data: Abdominal pain.  CHEST - 2 VIEW  Comparison: 03/07/2013.  Findings: Trachea is midline.  Heart size normal.  Probable mild bibasilar scarring.  Lungs are otherwise clear.  No pleural fluid.  IMPRESSION: Probable  bibasilar scarring.   Original Report Authenticated By: Leanna Battles, M.D.    EKG: Independently reviewed. Normal sinus rhythm at rate of 82, with PACs, no peaked T waves  Assessment/Plan Active Problems:   Hyperkalemia -in the setting of CKD with slightly increased cr and on ARB -will give D50, insulin, given kayexalate in ED, follow and repeat k and further treat accordingly -hold ARB, follow and recheck    Hyponatremia -likely secondary to volume depletion, and diuretic -although he has h/o CHF, he is compensated at this time -will hold lasix for today, gently hydrate, follow and recheck   Probable catheter associated UTI (lower urinary tract infection) -pt has a h/o recurrent UTIs as above,and has chronic indwelling catheter -will place on empiric vanc and rocephin- pending urine cultures, his last urine grew  staph that was resistant to PCN and quinolones but sens to oxacillin Abdominal pain -possibly secondary to UTI -CT of abd and pelvis as above with no acute abd findings -treat UTI as above, pain management and follow. Continue PPI -lactic acid level is wnl   Essential hypertension, benign -hold ARB secondary to #1, continue metoprolol   Diabetes mellitus -continue januvia, ssi, follow   Prostate cancer metastatic to bone - followed by Dr Lynnae Sandhoff, follow up oupt Chronic indwelling Foley catheter with chronic hematuria -monitor and consider inpatient urology consult vs outpt follow pending clinical course -Followed by Dr. Lynnae Sandhoff   Metabolic acidosis -possibly secondary to acute on CKD -his BG are ok controlled at this time and    Chronic systolic CHF (congestive heart failure) -clinically appears compensated at this time -monitor fluid status closely with gentle hydration today, follow and resume lasix in am as appropriate   Acute on CKD (chronic kidney disease) stage 4, GFR 15-29 ml/min -cr 2.44 today from 2.2 in May -will hold lasix for today, gently hydrate,  follow recheck  H/O bil Hydronephrosis -continue chronic indwelling foley catheter, follow up with urology      Code Status: FULL  Family Communication: NONE at bedside  Disposition Plan: admit to Tele  Time spent: >32mins  Kela Millin [facility] Pager [phone]  If 7PM-7AM, please contact night-coverage [email or web address] Password [facility] 05/22/2013, 6:55 PM

## 2013-05-22 NOTE — ED Notes (Signed)
Has not been taking meds at [facility] because " one of them pills is making me fall out and they won't tell me which one it is-- so I am not taking any medicine"

## 2013-05-22 NOTE — ED Notes (Signed)
To ED via [facility] medic 11 from [facility] on [address] with complaint of abd pain-- states "since i went there" was admitted on 05/08/13.

## 2013-05-22 NOTE — ED Notes (Signed)
Pt assisted to restroom, wanting to have a BM. Pt returns to room, sts he needs a laxative, wasn't able to go.

## 2013-05-22 NOTE — ED Notes (Signed)
Pt states he called 911 on his own cell phone because he told the staff at [facility] that he wanted to go to the dr or the ED and "they wouldn't send me" . States "I can mash the call bell and wait an hour or sometime they don't even come"

## 2013-05-22 NOTE — ED Notes (Signed)
Report to Jenna, RN  

## 2013-05-22 NOTE — Progress Notes (Signed)
ANTIBIOTIC CONSULT NOTE - INITIAL  Pharmacy Consult for Vancomycin  Indication: UTI  Allergies  Allergen Reactions  . Aspirin Other (See Comments)    "makes my stomach hurt real bad."  . Feldene (Piroxicam) Swelling    Patient Measurements: Height: 5\' 6"  (167.6 cm) IBW/kg (Calculated) : 63.8 Body weight: 74 kg  Vital Signs: Temp: 97.7 F (36.5 C) (06/25 1858) Temp src: Oral (06/25 1105) BP: 149/74 mmHg (06/25 1858) Pulse Rate: 88 (06/25 1858) Intake/Output from previous day:   Intake/Output from this shift:    Labs:  Recent Labs  05/22/13 1225  WBC 5.5  HGB 8.8*  PLT 369  CREATININE 2.44*   The CrCl is unknown because both a height and weight (above a minimum accepted value) are required for this calculation. No results found for this basename: VANCOTROUGH, VANCOPEAK, VANCORANDOM, GENTTROUGH, GENTPEAK, GENTRANDOM, TOBRATROUGH, TOBRAPEAK, TOBRARND, AMIKACINPEAK, AMIKACINTROU, AMIKACIN,  in the last 72 hours   Microbiology: No results found for this or any previous visit (from the past 720 hour(s)).  Medical History: Past Medical History  Diagnosis Date  . CAD (coronary artery disease)   . Hypertension   . Obesity   . Dyslipidemia   . Anemia   . H/O hemorrhoids   . H/O: GI bleed   . Rhinitis, allergic   . Fatty liver   . Shoulder dislocation 1970    right  . Myocardial infarction 2007  . History of gout   . Asthma   . Arthritis     "all over my whole body" (03/07/2013)  . Chronic kidney disease (CKD), stage III (moderate)     ckd III  . Carpal tunnel syndrome   . Prostate cancer 02/11/13-02/20/13    metastatic prostate cancer  . Bone metastasis   . Fall at home 04/07/2013  . CHF (congestive heart failure)   . Type II diabetes mellitus   . Foley catheter in place     "Coude" (04/08/2013)    Assessment: 77 YOM from [facility] admitted for abdominal pain. Patient with multiple medical problems including CAD, CHF, DM, prostate CA, CKD, recurrent  UTI (MSSA) with indwelling foley, UA concerning for UTI, pharmacy is consulted to start vancomycin. Blood and urine cultures are pending. Scr elevated = 2.44, est. crcl ~ 25 ml/min. Afebrile, wbc 5.5. Also started rocephin.  Goal of Therapy:  Vancomycin trough level 10-15 mcg/ml  Plan:  - Start Vancomycin 500mg  IV Q 24 hrs - F/u Renal function and cultures - Vancomycin trough at steady state if indicated - F/u narrowing antibiotic if cxs not growing MRSA  Bayard Hugger, PharmD, BCPS  Clinical Pharmacist  Pager: [phone]   05/22/2013,7:03 PM

## 2013-05-23 DIAGNOSIS — I10 Essential (primary) hypertension: Secondary | ICD-10-CM

## 2013-05-23 DIAGNOSIS — I509 Heart failure, unspecified: Secondary | ICD-10-CM

## 2013-05-23 DIAGNOSIS — R109 Unspecified abdominal pain: Secondary | ICD-10-CM

## 2013-05-23 DIAGNOSIS — I5022 Chronic systolic (congestive) heart failure: Secondary | ICD-10-CM

## 2013-05-23 LAB — BASIC METABOLIC PANEL
BUN: 44 mg/dL — ABNORMAL HIGH (ref 6–23)
CO2: 17 mEq/L — ABNORMAL LOW (ref 19–32)
Calcium: 9.1 mg/dL (ref 8.4–10.5)
GFR calc non Af Amer: 28 mL/min — ABNORMAL LOW (ref >90)
Glucose, Bld: 126 mg/dL — ABNORMAL HIGH (ref 70–99)
Sodium: 134 mEq/L — ABNORMAL LOW (ref 135–145)

## 2013-05-23 LAB — GLUCOSE, CAPILLARY
Glucose-Capillary: 99 mg/dL (ref 70–99)
Glucose-Capillary: 108 mg/dL — ABNORMAL HIGH (ref 70–99)
Glucose-Capillary: 135 mg/dL — ABNORMAL HIGH (ref 70–99)

## 2013-05-23 MED ORDER — SODIUM POLYSTYRENE SULFONATE 15 GM/60ML PO SUSP
30.0000 g | Freq: Once | ORAL | Status: DC
  Filled 2013-05-23: qty 120

## 2013-05-23 NOTE — Progress Notes (Signed)
Pt refused lovenox

## 2013-05-23 NOTE — Progress Notes (Signed)
UR COMPLETED  

## 2013-05-23 NOTE — Progress Notes (Signed)
[facility] PROGRESS NOTE  [name] ZOX:[id number] DOB: 08-Oct-1935 DOA: 05/22/2013 PCP: Leo Grosser, MD  Assessment/Plan: Hyperkalemia  -in the setting of CKD with slightly increased cr and on ARB  -given D50, insulin, given kayexalate in ED -hold ARB, follow and recheck  -pt refused labs am 05/23/13 -he is agreeable for lab on 05/24/13-->recheck K -repeat kayexalate Hyponatremia  -improved with IVF-->continue gentle hydration -likely secondary to volume depletion, and diuretic  -although he has h/o CHF, he is compensated at this time  -will hold lasix for today, gently hydrate, follow and recheck  catheter associated UTI (lower urinary tract infection)  -pt has a h/o recurrent UTIs as above,and has chronic indwelling catheter  -initially placed on empiric vanc and rocephin- pending urine cultures -Discontinue ceftriaxone -Continue IV vancomycin pending final urine culture data--currently shows Staphylococcus aureus -Previous urine cultures on April 25, may 9, may 11 showed MSSA  Abdominal pain  -possibly secondary to UTI-->significant improvement on IV abx -CT of abd and pelvis as above with no acute abd findings  -treat UTI as above, pain management and follow. Continue PPI  -lactic acid level is wnl  Essential hypertension, benign  -hold ARB secondary to #1, continue metoprolol  Diabetes mellitus  -continue januvia, ssi, follow  Prostate cancer metastatic to bone  - consulted Dr. Cyndie Mull Chronic indwelling Foley catheter with chronic hematuria  -monitor and consider inpatient urology consult vs outpt follow pending clinical course  -Followed by Dr. Lynnae Sandhoff  Metabolic acidosis  -possibly secondary to acute on CKD  -his BG are ok controlled at this time and  Chronic systolic CHF (congestive heart failure)  -clinically appears compensated at this time  -monitor fluid status closely with gentle hydration today, follow and resume lasix as appropriate  Acute  on CKD (chronic kidney disease) stage 4, GFR 15-29 ml/min  -creatinine improved to 2.18 on IVF -cr 2.44 today from 2.2 in May  -will hold lasix for today, gently hydrate, follow recheck  H/O bil Hydronephrosis  -continue chronic indwelling foley catheter, follow up with urology Family Communication:   Pt at beside Disposition Plan:   [facility] when medically stable  Antibiotics:  Vancomycin 05/22/2013>>>  Ceftriaxone 05/21/2013>>> 05/22/2013               Procedures/Studies: Ct Abdomen Pelvis Wo Contrast  05/22/2013   *RADIOLOGY REPORT*  Clinical Data: Abdominal pain, left lower quadrant pain, history prostate cancer with osseous metastases, chronic kidney disease, hypertension, coronary artery disease post MI, asthma, CHF, diabetes  CT ABDOMEN AND PELVIS WITHOUT CONTRAST  Technique:  Multidetector CT imaging of the abdomen and pelvis was performed following the standard protocol without intravenous contrast. Patient drank dilute oral contrast for study.  Sagittal and coronal MPR images reconstructed from axial data set.  Comparison: 01/1991 1014  Findings: Atelectasis at lung bases. Scattered peribronchial thickening. Focal area of opacity at the medial right lower lobe may represent persistent infiltrate though mass is not excluded, area measuring 14 x 11 mm image 7. Tiny nonobstructing calculus mid right kidney image 23. Bilateral hydronephrosis and hydroureter. Marked abnormal soft tissue is identified at the prostate bed bladder base, question prostatic enlargement though cannot exclude prostate cancer or bladder cancer, area overall measuring 8.9 x 8.1 x 5.6 cm. Foley catheter within urinary bladder. Remaining bladder wall appears thickened.  Within limits of a nonenhanced exam no focal abnormalities of the liver, spleen, pancreas or adrenal glands. Normal appendix. Stomach and bowel loops grossly unremarkable. Scattered atherosclerotic calcifications without aortic  aneurysm.  No mass, free fluid, or free air. Slightly prominent inguinal canals containing fat. Enlarged left periaortic lymph node 2.0 x 1.5 cm image 26. Numerous sclerotic foci are identified throughout the lumbar spine, pelvis and proximal femora consistent with widespread osseous metastatic disease.  IMPRESSION: Bilateral hydronephrosis secondary to marked enlargement of the prostate gland, cannot exclude prostate cancer or cancer arising from the base of the urinary bladder. Slight increase in size of a left periaortic lymph node. Question focus of atelectasis or scarring at medial right lower lobe though mass/nodule not excluded 14 x 11 mm, recommend attention on follow-up exams. Extensive osseous metastatic disease.   Original Report Authenticated By: Ulyses Southward, M.D.   Dg Chest 2 View  05/22/2013   *RADIOLOGY REPORT*  Clinical Data: Abdominal pain.  CHEST - 2 VIEW  Comparison: 03/07/2013.  Findings: Trachea is midline.  Heart size normal.  Probable mild bibasilar scarring.  Lungs are otherwise clear.  No pleural fluid.  IMPRESSION: Probable bibasilar scarring.   Original Report Authenticated By: Leanna Battles, M.D.         Subjective: Patient states abdominal pain is much better today. He denies, fevers, chills, chest pain, shortness breath, nausea, vomiting, diarrhea, dizziness. He refused his labs this morning. He states that he would be agreeable for laboratory draw on 05/24/2013  Objective: Filed Vitals:   05/22/13 1955 05/22/13 2029 05/23/13 0552 05/23/13 0959  BP:  138/72 142/77 108/62  Pulse:  84 88 85  Temp:  97.9 F (36.6 C) 98 F (36.7 C)   TempSrc:  Oral Oral   Resp:  20 20   Height:      Weight: 67.8 kg (149 lb 7.6 oz)  66.8 kg (147 lb 4.3 oz)   SpO2:  100% 98%     Intake/Output Summary (Last 24 hours) at 05/23/13 1030 Last data filed at 05/23/13 0816  Gross per 24 hour  Intake 1047.5 ml  Output    626 ml  Net  421.5 ml   Weight change:  Exam:   General:  Pt is  alert, follows commands appropriately, not in acute distress  HEENT: No icterus, No thrush,  [place]/AT  Cardiovascular: RRR, S1/S2, no rubs, no gallops  Respiratory: diminshed BS at bases but CTA  Abdomen: Soft/+BS, non tender, non distended, no guarding  Extremities: No edema, No lymphangitis, No petechiae, No rashes, no synovitis  Data Reviewed: Basic Metabolic Panel:  Recent Labs Lab 05/22/13 1225 05/22/13 2033 05/22/13 2335  NA 131*  --  134*  K 5.8*  --  5.9*  CL 104  --  109  CO2 17*  --  17*  GLUCOSE 166*  --  126*  BUN 47*  --  44*  CREATININE 2.44* 2.20* 2.18*  CALCIUM 9.2  --  9.1   Liver Function Tests:  Recent Labs Lab 05/22/13 1225  AST 25  ALT 24  ALKPHOS 717*  BILITOT 0.1*  PROT 7.0  ALBUMIN 3.0*    Recent Labs Lab 05/22/13 1225  LIPASE 65*   No results found for this basename: AMMONIA,  in the last 168 hours CBC:  Recent Labs Lab 05/22/13 1225 05/22/13 2033  WBC 5.5 5.3  NEUTROABS 3.4  --   HGB 8.8* 8.8*  HCT 26.3* 26.6*  MCV 77.8* 78.9  PLT 369 366   Cardiac Enzymes:  Recent Labs Lab 05/22/13 2033  TROPONINI <0.30   BNP: No components found with this basename: POCBNP,  CBG:  Recent Labs Lab 05/22/13 2102  05/23/13 0640  GLUCAP 121* 99    Recent Results (from the past 240 hour(s))  URINE CULTURE     Status: None   Collection Time    05/22/13  1:21 PM      Result Value Range Status   Specimen Description URINE, RANDOM   Final   Special Requests NONE   Final   Culture  Setup Time 05/22/2013 18:39   Final   Colony Count >=100,000 COLONIES/ML   Final   Culture     Final   Value: STAPHYLOCOCCUS AUREUS     Note: RIFAMPIN AND GENTAMICIN SHOULD NOT BE USED AS SINGLE DRUGS FOR TREATMENT OF STAPH INFECTIONS.   Report Status PENDING   Incomplete     Scheduled Meds: . allopurinol  100 mg Oral Daily  . bicalutamide  50 mg Oral Daily  . enoxaparin (LOVENOX) injection  30 mg Subcutaneous Q24H  . furosemide  40 mg Oral Daily   . insulin aspart  0-9 Units Subcutaneous TID WC  . insulin aspart  10 Units Intravenous Once  . isosorbide mononitrate  60 mg Oral Daily  . linagliptin  5 mg Oral Daily  . metoprolol succinate  25 mg Oral Daily  . pantoprazole  40 mg Oral BID AC  . simvastatin  20 mg Oral QPM  . sodium chloride  3 mL Intravenous Q12H  . sodium polystyrene  30 g Oral Once  . tamsulosin  0.4 mg Oral Daily  . vancomycin  500 mg Intravenous Q24H   Continuous Infusions:    Odes Lolli, DO  [facility] Pager [phone]  If 7PM-7AM, please contact night-coverage [email or web address] Password [email or web address] 05/23/2013, 10:30 AM   LOS: 1 day

## 2013-05-23 NOTE — Progress Notes (Signed)
PT Cancellation Note  Patient Details Name: [name] MRN: [id number] DOB: 1934-11-30   Cancelled Treatment:    Reason Eval/Treat Not Completed: Patient declined, no reason specified.  Pt recently sleeping and adamantly refusing therapy and getting OOB.   [name] 05/23/2013, 11:56 AM Jake Shark, PT DPT [phone]

## 2013-05-23 NOTE — Progress Notes (Signed)
Pt refused am labs, pt also refused kayexalate, pt given explanation on why labs needed to be done as well as medication

## 2013-05-23 NOTE — Progress Notes (Signed)
Pharmacist Heart Failure Core Measure Documentation  Assessment: [name] has an EF documented as 25% on 03/08/13 by ECHO.  Rationale: Heart failure patients with left ventricular systolic dysfunction (LVSD) and an EF < 40% should be prescribed an angiotensin converting enzyme inhibitor (ACEI) or angiotensin receptor blocker (ARB) at discharge unless a contraindication is documented in the medical record.  This patient is not currently on an ACEI or ARB for HF.  This note is being placed in the record in order to provide documentation that a contraindication to the use of these agents is present for this encounter.  ACE Inhibitor or Angiotensin Receptor Blocker is contraindicated (specify all that apply)  []   ACEI allergy AND ARB allergy []   Angioedema []   Moderate or severe aortic stenosis []   Hyperkalemia []   Hypotension []   Renal artery stenosis [x]   Worsening renal function, preexisting renal disease or dysfunction   [name] 05/23/2013 1:42 PM

## 2013-05-24 DIAGNOSIS — I2510 Atherosclerotic heart disease of native coronary artery without angina pectoris: Secondary | ICD-10-CM

## 2013-05-24 LAB — GLUCOSE, CAPILLARY

## 2013-05-24 LAB — URINE CULTURE: Colony Count: >=100000

## 2013-05-24 LAB — BASIC METABOLIC PANEL
CO2: 16 mEq/L — ABNORMAL LOW (ref 19–32)
Calcium: 8.6 mg/dL (ref 8.4–10.5)
GFR calc non Af Amer: 24 mL/min — ABNORMAL LOW (ref >90)
Glucose, Bld: 100 mg/dL — ABNORMAL HIGH (ref 70–99)
Potassium: 4.8 mEq/L (ref 3.5–5.1)
Sodium: 135 mEq/L (ref 135–145)

## 2013-05-24 MED ORDER — DOXYCYCLINE HYCLATE 100 MG PO TABS
100.0000 mg | ORAL_TABLET | Freq: Two times a day (BID) | ORAL | Status: DC
  Administered 2013-05-24: 100 mg via ORAL
  Filled 2013-05-24 (×2): qty 1

## 2013-05-24 MED ORDER — TETRACYCLINE HCL 500 MG PO CAPS: 500.0000 mg | ORAL_CAPSULE | Freq: Two times a day (BID) | ORAL | Status: DC

## 2013-05-24 MED ORDER — TETRACYCLINE HCL 250 MG PO CAPS: 500.0000 mg | ORAL_CAPSULE | Freq: Two times a day (BID) | ORAL | Status: DC

## 2013-05-24 MED ORDER — TETRACYCLINE HCL 250 MG PO CAPS
500.0000 mg | ORAL_CAPSULE | Freq: Two times a day (BID) | ORAL | Status: DC
  Filled 2013-05-24 (×2): qty 2

## 2013-05-24 NOTE — Progress Notes (Signed)
PT Cancellation Note  Patient Details Name: [name] MRN: [id number] DOB: 08/07/1935   Cancelled Treatment:    Reason Eval/Treat Not Completed: Other (comment) (pt sound a sleep and just finished working with OT.  )  Will attempt this PM if time allows.    Giordana Weinheimer 05/24/2013, 1:03 PM Jake Shark, PT DPT [phone]

## 2013-05-24 NOTE — Discharge Summary (Signed)
Physician Discharge Summary  [name] ZOX:[id number] DOB: 31-Mar-1935 DOA: 05/22/2013  PCP: Leo Grosser, MD  Admit date: 05/22/2013 Discharge date: 05/24/2013  Recommendations for Outpatient Follow-up:  1. Pt will need to follow up with PCP in 2 weeks post discharge 2. Please obtain BMP to evaluate electrolytes and kidney function 3. Please also check CBC to evaluate Hg and Hct levels 4. Please call to followup with urology, Dr. Patsi Sears, in 2-4 weeks 5. Please call to followup with [facility] cardiology in 1-2 weeks.  Discharge Diagnoses:  Active Problems:   Essential hypertension, benign   Hyperkalemia   Hyponatremia   UTI (lower urinary tract infection)   Diabetes mellitus   Prostate cancer metastatic to bone   Metabolic acidosis   Chronic systolic CHF (congestive heart failure)   CKD (chronic kidney disease) stage 4, GFR 15-29 ml/min   Abdominal  pain, other specified site Hyperkalemia  -in the setting of CKD with slightly increased creatinine and on ARB  -given D50, insulin, given kayexalate in ED  -hold ARB -pt refused labs am 05/23/13  -he is agreeable for lab on 05/24/13-->recheck K  -repeat kayexalate--had 2 doses -Repeat potassium 4.8 on the day of discharge -Will not restart the patient losartan in the face of the patient's CKD and hyperkalemia Hyponatremia  -improved with IVF-->continue gentle hydration  -likely secondary to volume depletion, and diuretic  -although he has h/o CHF, he is compensated at this time  -will hold lasix for today, gently hydrate, follow and recheck  -Patient was restarted on his home dose of furosemide -Sodium 135 on the day of discharge catheter associated UTI (lower urinary tract infection)-MRSA  -pt has a h/o recurrent UTIs as above,and has chronic indwelling catheter  -initially placed on empiric vanc and rocephin- pending urine cultures  -Discontinue ceftriaxone  -Continue IV vancomycin pending final urine culture  data -Culture data showed MRSA -The patient was changed from IV vancomycin to oral tetracycline 500 mg twice a day (dose adjusted for his renal function) -Previous urine cultures on April 25, may 9, may 11 showed MSSA  -The patient will continue on tetracycline 500 mg twice a day for 12 additional days Abdominal pain  -possibly secondary to UTI-->significant improvement on IV abx  -CT of abd and pelvis as above with no acute abd findings  -treat UTI as above, pain management and follow. Continue PPI  -lactic acid level is wnl  -With treatment of the patient's UTI, the patient's abdominal pain completely resolved -He tolerated his diet without vomiting Chest pain -On the first day of hospitalization, the patient refused any lab work including his troponins.  Another repeated attempt was made to redraw another troponin which the patient again refused. -Troponin was initially negative in the emergency department -EKG showed left bundle-branch block which was unchanged from his previous EKGs -The patient has not been a candidate for antiplatelet therapy due to his history of hematuria and GI bleed -He remained chest pain free for the rest of the hospitalization Essential hypertension, benign  -hold ARB secondary to #1, continue metoprolol  -He will need to follow up with his primary care physician as well as his cardiologist as his losartan was discontinued due to hyperkalemia -His blood pressure remained stable throughout the hospitalization Diabetes mellitus  -continue [place] -The patient's CBGs remained controlled during the hospitalization Prostate cancer metastatic to bone  - consulted Dr. Cyndie Mull  Chronic indwelling Foley catheter with chronic hematuria  -monitor and consider inpatient urology consult vs outpt follow  pending clinical course  -Followed by Dr. Lynnae Sandhoff  -I spoke with Dr. Ezzie Dural whom reviewed the patient's CT abdomen. He did not feel that any intervention was  necessary at this time and recommended that the patient followup in his office in 2-4 weeks' time Metabolic acidosis  -possibly secondary to acute on CKD  -his BG are ok controlled at this time   Chronic systolic CHF (congestive heart failure)  -clinically appears compensated at this time  -monitor fluid status closely with gentle hydration today, -The patient was restarted on his daily dose of furosemide Acute on CKD (chronic kidney disease) stage 4, GFR 15-29 ml/min  -Patient's creatinine has had wide fluctuations anywhere between 2.4-2.9 -cr 2.48 today from 2.2 in May  -Furosemide was initially discontinued and the patient was given fluids H/O bil Hydronephrosis  -continue chronic indwelling foley catheter, follow up with urology  Family Communication: Pt at beside  Disposition Plan: [facility] when medically stable  Antibiotics:  Vancomycin 05/22/2013>>> 05/23/13 Ceftriaxone 05/21/2013>>> 05/22/2013 Tetracycline 05/24/13>>>   Discharge Condition: stable  Disposition: [facility]  Diet:cardiac Wt Readings from Last 3 Encounters:  05/24/13 68.856 kg (151 lb 12.8 oz)  04/11/13 73.9 kg (162 lb 14.7 oz)  04/05/13 73.029 kg (161 lb)    History of present illness:  78 y.o. male SN[facility] resident with multiple medical problems, including stage IV prostate cancer, recurrent UTIs with chronic indwelling Foley catheter,chronic kidney disease, congestive heart failure, diabetes, hypertension, gout, anemia,who was brought to the emergency room via EMS with complaints of abdominal pain for the past 2 weeks. He states that the pain as all over his abdomen and he is unable to further describe. he denies of fevers, and admits to pain with urination with his cath Foley catheter in place. He states he has had intermittent hematuria for a while and she was to followup with Dr. Lynnae Sandhoff. He denies nausea vomiting, diarrhea, melena and no hematochezia. He denies constipation stating that  he had a bowel movement today. In the ED had a urinalysis which was nitrite positive large leukocytes and with wbc's too numerous to count and rare bacteria. A CT scan of his abdomen and pelvis were done which showed bilateral hydronephrosis secondary to marked enlargement of prostate gland, normal appendix and otherwise no acute abdominal findings. Electrolytes revealed a potassium of 5.8, creatinine of 2.44(up from 2.2 in May 2014). He was given Kayexalate, started on empiric ceftriaxone and vancomycin and is admitted for further evaluation and management.      Discharge Exam: Filed Vitals:   05/24/13 0850  BP: 139/65  Pulse: 91  Temp:   Resp:    Filed Vitals:   05/23/13 1334 05/23/13 2148 05/24/13 0504 05/24/13 0850  BP: 104/56 111/68 118/49 139/65  Pulse:  84 82 91  Temp:  98.2 F (36.8 C) 98.4 F (36.9 C)   TempSrc:  Oral Oral   Resp:  18 18   Height:      Weight:   68.856 kg (151 lb 12.8 oz)   SpO2:  100% 100% 100%   General: A&O x 3, NAD, pleasant, cooperative Cardiovascular: RRR, no rub, no gallop, no S3 Respiratory: CTAB, no wheeze, no rhonchi Abdomen:soft, nontender, nondistended, positive bowel sounds Extremities: No edema, No lymphangitis, no petechiae  Discharge Instructions      Discharge Orders   Future Appointments Provider Department Dept Phone   06/06/2013 12:30 PM Donita Brooks, MD [facility] [phone]   Future Orders Complete  By Expires     Diet - low sodium heart healthy  As directed     Increase activity slowly  As directed         Medication List    STOP taking these medications       APLISOL 5 UNIT/0.1ML injection  Generic drug:  tuberculin     losartan 50 MG tablet  Commonly known as:  COZAAR      TAKE these medications       allopurinol 100 MG tablet  Commonly known as:  ZYLOPRIM  Take 200 mg by mouth daily.     bicalutamide 50 MG tablet  Commonly known as:  CASODEX  Take 50 mg by mouth daily.      furosemide 40 MG tablet  Commonly known as:  LASIX  Take 1 tablet (40 mg total) by mouth daily.     isosorbide mononitrate 60 MG 24 hr tablet  Commonly known as:  IMDUR  Take 60 mg by mouth daily.     metoprolol succinate 25 MG 24 hr tablet  Commonly known as:  TOPROL-XL  Take 25 mg by mouth daily.     nitroGLYCERIN 0.4 MG SL tablet  Commonly known as:  NITROSTAT  Place 0.4 mg under the tongue every 5 (five) minutes as needed for chest pain.     omeprazole 40 MG capsule  Commonly known as:  PRILOSEC  Take 40 mg by mouth daily.     oxyCODONE-acetaminophen 5-325 MG per tablet  Commonly known as:  PERCOCET/ROXICET  Take 1 tablet by mouth every 4 (four) hours as needed for pain.     simvastatin 20 MG tablet  Commonly known as:  ZOCOR  Take 20 mg by mouth every evening.     sitaGLIPtin 25 MG tablet  Commonly known as:  JANUVIA  Take 1 tablet (25 mg total) by mouth daily with breakfast.     tamsulosin 0.4 MG Caps  Commonly known as:  FLOMAX  Take 0.4 mg by mouth daily.     tetracycline 500 MG capsule  Commonly known as:  ACHROMYCIN,SUMYCIN  Take 1 capsule (500 mg total) by mouth 2 (two) times daily before a meal.         The results of significant diagnostics from this hospitalization (including imaging, microbiology, ancillary and laboratory) are listed below for reference.    Significant Diagnostic Studies: Ct Abdomen Pelvis Wo Contrast  05/22/2013   *RADIOLOGY REPORT*  Clinical Data: Abdominal pain, left lower quadrant pain, history prostate cancer with osseous metastases, chronic kidney disease, hypertension, coronary artery disease post MI, asthma, CHF, diabetes  CT ABDOMEN AND PELVIS WITHOUT CONTRAST  Technique:  Multidetector CT imaging of the abdomen and pelvis was performed following the standard protocol without intravenous contrast. Patient drank dilute oral contrast for study.  Sagittal and coronal MPR images reconstructed from axial data set.  Comparison:  01/1991 1014  Findings: Atelectasis at lung bases. Scattered peribronchial thickening. Focal area of opacity at the medial right lower lobe may represent persistent infiltrate though mass is not excluded, area measuring 14 x 11 mm image 7. Tiny nonobstructing calculus mid right kidney image 23. Bilateral hydronephrosis and hydroureter. Marked abnormal soft tissue is identified at the prostate bed bladder base, question prostatic enlargement though cannot exclude prostate cancer or bladder cancer, area overall measuring 8.9 x 8.1 x 5.6 cm. Foley catheter within urinary bladder. Remaining bladder wall appears thickened.  Within limits of a nonenhanced exam no focal abnormalities of the liver,  spleen, pancreas or adrenal glands. Normal appendix. Stomach and bowel loops grossly unremarkable. Scattered atherosclerotic calcifications without aortic aneurysm. No mass, free fluid, or free air. Slightly prominent inguinal canals containing fat. Enlarged left periaortic lymph node 2.0 x 1.5 cm image 26. Numerous sclerotic foci are identified throughout the lumbar spine, pelvis and proximal femora consistent with widespread osseous metastatic disease.  IMPRESSION: Bilateral hydronephrosis secondary to marked enlargement of the prostate gland, cannot exclude prostate cancer or cancer arising from the base of the urinary bladder. Slight increase in size of a left periaortic lymph node. Question focus of atelectasis or scarring at medial right lower lobe though mass/nodule not excluded 14 x 11 mm, recommend attention on follow-up exams. Extensive osseous metastatic disease.   Original Report Authenticated By: Ulyses Southward, M.D.   Dg Chest 2 View  05/22/2013   *RADIOLOGY REPORT*  Clinical Data: Abdominal pain.  CHEST - 2 VIEW  Comparison: 03/07/2013.  Findings: Trachea is midline.  Heart size normal.  Probable mild bibasilar scarring.  Lungs are otherwise clear.  No pleural fluid.  IMPRESSION: Probable bibasilar scarring.    Original Report Authenticated By: Leanna Battles, M.D.     Microbiology: Recent Results (from the past 240 hour(s))  URINE CULTURE     Status: None   Collection Time    05/22/13  1:21 PM      Result Value Range Status   Specimen Description URINE, RANDOM   Final   Special Requests NONE   Final   Culture  Setup Time 05/22/2013 18:39   Final   Colony Count >=100,000 COLONIES/ML   Final   Culture     Final   Value: METHICILLIN RESISTANT STAPHYLOCOCCUS AUREUS     Note: RIFAMPIN AND GENTAMICIN SHOULD NOT BE USED AS SINGLE DRUGS FOR TREATMENT OF STAPH INFECTIONS. CRITICAL RESULT CALLED TO, READ BACK BY AND VERIFIED WITH: JESS S. @ 8:22AM 6.27.14 BY DESIS   Report Status 05/24/2013 FINAL   Final   Organism ID, Bacteria METHICILLIN RESISTANT STAPHYLOCOCCUS AUREUS   Final     Labs: Basic Metabolic Panel:  Recent Labs Lab 05/22/13 1225 05/22/13 2033 05/22/13 2335 05/24/13 0510  NA 131*  --  134* 135  K 5.8*  --  5.9* 4.8  CL 104  --  109 108  CO2 17*  --  17* 16*  GLUCOSE 166*  --  126* 100*  BUN 47*  --  44* 44*  CREATININE 2.44* 2.20* 2.18* 2.48*  CALCIUM 9.2  --  9.1 8.6   Liver Function Tests:  Recent Labs Lab 05/22/13 1225  AST 25  ALT 24  ALKPHOS 717*  BILITOT 0.1*  PROT 7.0  ALBUMIN 3.0*    Recent Labs Lab 05/22/13 1225  LIPASE 65*   No results found for this basename: AMMONIA,  in the last 168 hours CBC:  Recent Labs Lab 05/22/13 1225 05/22/13 2033  WBC 5.5 5.3  NEUTROABS 3.4  --   HGB 8.8* 8.8*  HCT 26.3* 26.6*  MCV 77.8* 78.9  PLT 369 366   Cardiac Enzymes:  Recent Labs Lab 05/22/13 2033  TROPONINI <0.30   BNP: No components found with this basename: POCBNP,  CBG:  Recent Labs Lab 05/23/13 0640 05/23/13 1113 05/23/13 2138 05/24/13 0606 05/24/13 1057  GLUCAP 99 108* 135* 91 182*    Time coordinating discharge:  Greater than 30 minutes  Signed:  Sylvain Hasten, DO [facility] Pager: [phone] 05/24/2013, 12:13  PM

## 2013-05-24 NOTE — Progress Notes (Signed)
Pt refused insulin, report given to [facility], pt stable, pt leaving via ambulance,

## 2013-05-24 NOTE — Progress Notes (Signed)
Pt refused vital at this time

## 2013-05-24 NOTE — Evaluation (Signed)
Occupational Therapy Evaluation Patient Details Name: [name] MRN: [id number] DOB: Dec 27, 1934 Today's Date: 05/24/2013 Time: 7829-5621 OT Time Calculation (min): 12 min  OT Assessment / Plan / Recommendation History of present illness 78 yr old male admitted to [facility] secondary to abdominal pain, UTI, hypokalemia, and history prostate and bone cancer.    Clinical Impression   Pt with very limited participation during session.  Pt sat EOB then became upset when attempting to donn socks as therapist tried to discuss PLOF and why therapy was coming to see him.  Pt continually responding to questions with short statements such as "I didn't ask you to tell me why you came."  I'm a grown man, your not my boss."  "Nobody has to help me."  When asked why he is upset pt reports that he is upset because everyone is trying to "boss him around and worry him". When therapist tried to re-assure him that people weren't trying to boss him around and attempting to get him to continue with donning his socks, he became more argumentative and eventually became so upset he refused to do anymore activity and immediately layed back down in the bed. Feel he will benefit from OT at SNF on trial basis but unsure if he will even cooperate.  Defer further treatment to SNF.    OT Assessment  All further OT needs can be met in the next venue of care    Follow Up Recommendations  SNF       Equipment Recommendations  None recommended by OT          Precautions / Restrictions Precautions Precautions: Fall Restrictions Weight Bearing Restrictions: No   Pertinent Vitals/Pain Pt with no report of pain at beginning of session    ADL  Eating/Feeding: Performed;Independent Where Assessed - Eating/Feeding: Bed level Grooming: Simulated;Set up Where Assessed - Grooming: Unsupported sitting Upper Body Bathing: Simulated;Set up Where Assessed - Upper Body Bathing: Unsupported sitting Lower Body Bathing:  Simulated;Set up Where Assessed - Lower Body Bathing: Unsupported sitting Upper Body Dressing: Simulated;Set up Where Assessed - Upper Body Dressing: Unsupported sitting Lower Body Dressing: Performed;Supervision/safety (Gripper socks only) Where Assessed - Lower Body Dressing: Unsupported sitting Transfers/Ambulation Related to ADLs: Did not attempt ADL Comments: Limited evaluation performed based on pt's level of participation.  Pt became agiatated at the begining of session secondary to therapist asking him questions.  Therapist tried to explain to pt reason for therapy evaluation.  However pt continually responding "I don't care what you want"  "I didn't ask you anything."  Pt sat EOB and attempted donning gripper socks but became more agitated when therapist kept asking questions.  He eventually decided to lay back down and would not do any further activity.      OT Diagnosis: Generalized weakness  OT Problem List: Decreased strength     Visit Information  Last OT Received On: 05/24/13 Assistance Needed: +1 History of Present Illness: 78 yr old male admitted to [facility] secondary to abdominal pain, UTI, hypokalemia, and history prostate and bone cancer.        Prior Functioning     Home Living Family/patient expects to be discharged to:: Skilled nursing facility Additional Comments: Pt reports he does his on bath and gets dressed at [facility] and than "No one has to help Me." Unsure how truthful this is.         Vision/Perception Vision - History Baseline Vision: No visual deficits Vision - Assessment Vision Assessment: Vision not  tested Perception Perception: Not tested Praxis Praxis: Not tested   Cognition  Cognition Arousal/Alertness: Awake/alert Behavior During Therapy: Agitated Overall Cognitive Status: Difficult to assess Difficult to assess due to:  (Pt's attitude and comments to therapist when questions were )    Extremity/Trunk Assessment Upper  Extremity Assessment Upper Extremity Assessment: Overall WFL for tasks assessed Lower Extremity Assessment Lower Extremity Assessment: Defer to PT evaluation     Mobility Bed Mobility Bed Mobility: Supine to Sit Supine to Sit: 5: Supervision;With rails;HOB flat Transfers Transfers: Not assessed        Balance Balance Balance Assessed: Yes Dynamic Sitting Balance Dynamic Sitting - Balance Support: No upper extremity supported Dynamic Sitting - Level of Assistance: 5: Stand by assistance   End of Session OT - End of Session Activity Tolerance: Treatment limited secondary to agitation Patient left: in bed;with call bell/phone within reach Nurse Communication: Other (comment) (Pt's lack of participation)     [facility] OTR/L Pager number [phone] 05/24/2013, 8:41 AM

## 2013-05-25 NOTE — ED Provider Notes (Signed)
Medical screening examination/treatment/procedure(s) were conducted as a shared visit with non-physician practitioner(s) and myself.  I personally evaluated the patient during the encounter  Bridget Tchula, MD 05/25/13 1101 

## 2013-05-27 ENCOUNTER — Other Ambulatory Visit: Payer: Self-pay | Admitting: *Deleted

## 2013-05-27 MED ORDER — OXYCODONE-ACETAMINOPHEN 5-325 MG PO TABS: 1.0000 | ORAL_TABLET | ORAL | Status: DC | PRN

## 2013-05-27 NOTE — H&P (Signed)
Kela Millin, MD Physician Signed Internal Medicine Discharge Summaries Service date: 05/22/2013 6:54 PM  [facility] History and Physical  Admission history and physical of 6/25 mislabeled as a discharge summary [name] ZOX:[id number] DOB: [birth year] DOA: 05/22/2013   Referring physician: Dr. Ethelda Chick PCP: Leo Grosser, MD   Specialists:   Chief Complaint: Abdominal pain   HPI: [name] is a 78 y.o. male [facility] with multiple medical problems, including stage IV prostate cancer, recurrent UTIs with chronic indwelling Foley catheter,chronic kidney disease, congestive heart failure, diabetes, hypertension, gout, anemia,who was brought to the emergency room via EMS with complaints of abdominal pain for the past 2 weeks. He states that the pain as all over his abdomen and he is unable to further describe. he denies of fevers, and admits to pain with urination with his cath Foley catheter in place. He states he has had intermittent hematuria for a while and she was to followup with Dr. Lynnae Sandhoff. He denies nausea vomiting, diarrhea, melena and no hematochezia. He denies constipation stating that he had a bowel movement today. In the ED had a urinalysis which was nitrite positive large leukocytes and with wbc's too numerous to count and rare bacteria. A CT scan of his abdomen and pelvis were done which showed bilateral hydronephrosis secondary to marked enlargement of prostate gland, normal appendix and otherwise no acute abdominal findings. Electrolytes revealed a potassium of 5.8, creatinine of 2.44(up from 2.2 in May 2014). He was given Kayexalate, started on empiric antibiotics and is admitted for further evaluation and management. His last urine culture grew staph aureus resistant to penicillin (but sensitive to oxacillin)       Review of Systems: The patient denies anorexia, fever, weight loss,, vision loss, decreased hearing, hoarseness, chest  pain, syncope, dyspnea on exertion, peripheral edema, balance deficits, hemoptysis,, melena, hematochezia, severe indigestion/heartburn, genital sores, muscle weakness,     Past Medical History   Diagnosis  Date   .  CAD (coronary artery disease)     .  Hypertension     .  Obesity     .  Dyslipidemia     .  Anemia     .  H/O hemorrhoids     .  H/O: GI bleed     .  Rhinitis, allergic     .  Fatty liver     .  Shoulder dislocation  1970       right   .  Myocardial infarction  2007   .  History of gout     .  Asthma     .  Arthritis         "all over my whole body" (03/07/2013)   .  Chronic kidney disease (CKD), stage III (moderate)         ckd III   .  Carpal tunnel syndrome     .  Prostate cancer  02/11/13-02/20/13       metastatic prostate cancer   .  Bone metastasis     .  Fall at home  04/07/2013   .  CHF (congestive heart failure)     .  Type II diabetes mellitus     .  Foley catheter in place         "Coude" (04/08/2013)    Past Surgical History   Procedure  Laterality  Date   .  Transurethral resection of prostate    06/08/2011, 11/08/06   .  Nerve repair  Right  1974       "hand" (03/07/2013)   .  Cataract extraction w/ intraocular lens  implant, bilateral  Bilateral      Social History: reports that he has quit smoking. He has never used smokeless tobacco. He reports that he does not drink alcohol or use illicit drugs. where does patient live-- SNF      Allergies   Allergen  Reactions   .  Aspirin  Other (See Comments)       "makes my stomach hurt real bad."   .  Feldene (Piroxicam)  Swelling       Family History   Problem  Relation  Age of Onset   .  Diabetes  Mother         Prior to Admission medications    Medication  Sig  Start Date  End Date  Taking?  Authorizing Provider   allopurinol (ZYLOPRIM) 100 MG tablet  Take 200 mg by mouth daily.      Yes  Historical Provider, MD   bicalutamide (CASODEX) 50 MG tablet  Take 50 mg by mouth daily.   03/12/13    Yes   Historical Provider, MD   furosemide (LASIX) 40 MG tablet  Take 1 tablet (40 mg total) by mouth daily.  04/11/13    Yes  Richarda Overlie, MD   isosorbide mononitrate (IMDUR) 60 MG 24 hr tablet  Take 60 mg by mouth daily.   03/12/13    Yes  Historical Provider, MD   losartan (COZAAR) 50 MG tablet  Take 1 tablet (50 mg total) by mouth daily.  03/21/13    Yes  Donita Brooks, MD   metoprolol succinate (TOPROL-XL) 25 MG 24 hr tablet  Take 25 mg by mouth daily.      Yes  Historical Provider, MD   nitroGLYCERIN (NITROSTAT) 0.4 MG SL tablet  Place 0.4 mg under the tongue every 5 (five) minutes as needed for chest pain.       Yes  Historical Provider, MD   omeprazole (PRILOSEC) 40 MG capsule  Take 40 mg by mouth daily.      Yes  Historical Provider, MD   oxyCODONE-acetaminophen (PERCOCET/ROXICET) 5-325 MG per tablet  Take 1 tablet by mouth every 4 (four) hours as needed for pain.      Yes  Historical Provider, MD   simvastatin (ZOCOR) 20 MG tablet  Take 20 mg by mouth every evening.      Yes  Historical Provider, MD   sitaGLIPtin (JANUVIA) 25 MG tablet  Take 1 tablet (25 mg total) by mouth daily with breakfast.  05/27/12  05/27/13  Yes  Cosmo Tetreault C Griffith Santilli, MD   Tamsulosin HCl (FLOMAX) 0.4 MG CAPS  Take 0.4 mg by mouth daily.      Yes  Historical Provider, MD   tuberculin (APLISOL) 5 UNIT/0.1ML injection  Inject 5 Units into the skin See admin instructions. Patient received 0.83ml on 6/23, is due one more dose of 0.51ml on 6/25      Yes  Historical Provider, MD      Physical Exam: Filed Vitals:     05/22/13 1327  05/22/13 1345  05/22/13 1415  05/22/13 1700   BP:  144/73  150/74  110/86  152/75   Pulse:  88  86  89  88   Temp:           TempSrc:           Resp:  16  20  20     SpO2:  100%  100%  100%  100%      Constitutional: Vital signs reviewed.  Patient is a well-developed and well-nourished  in no acute distress and cooperative with exam. Alert and oriented x3.   Head: Normocephalic and  atraumatic Nose: No erythema or drainage noted.   Mouth: no erythema or exudates, slightly dry MM Eyes: PERRL, EOMI, conjunctivae normal, No scleral icterus.  Neck: Supple, Trachea midline normal ROM, No JVD, mass, thyromegaly, or carotid bruit present.  Cardiovascular: RRR, S1 normal, S2 normal, no MRG, pulses symmetric and intact bilaterally Pulmonary/Chest: normal respiratory effort, CTAB, no wheezes, rales, or rhonchi Abdominal: Soft. Non-tender, non-distended, bowel sounds are normal, no masses, organomegaly, or guarding present.   GU: no CVA tenderness, chronic indwelling catheter present with gross hematuria  Extremities: No cyanosis and no edema   Neurological: A&O x3, Strength is normal and symmetric bilaterally, cranial nerve II-XII are grossly intact, no focal motor deficit, sensory intact to light touch bilaterally.  Skin: Warm, dry and intact. No rash Psychiatric: Normal mood and affect. speech and behavior is normal.      Labs on Admission:  Basic Metabolic Panel: Recent Labs Lab  05/22/13 1225   NA  131*   K  5.8*   CL  104   CO2  17*   GLUCOSE  166*   BUN  47*   CREATININE  2.44*   CALCIUM  9.2    Liver Function Tests: Recent Labs Lab  05/22/13 1225   AST  25   ALT  24   ALKPHOS  717*   BILITOT  0.1*   PROT  7.0   ALBUMIN  3.0*    Recent Labs Lab  05/22/13 1225   LIPASE  65*    No results found for this basename: AMMONIA,  in the last 168 hours CBC: Recent Labs Lab  05/22/13 1225   WBC  5.5   NEUTROABS  3.4   HGB  8.8*   HCT  26.3*   MCV  77.8*   PLT  369    Cardiac Enzymes: No results found for this basename: CKTOTAL, CKMB, CKMBINDEX, TROPONINI,  in the last 168 hours   BNP (last 3 results) Recent Labs   03/07/13 1010  03/21/13 1425  05/22/13 1226   PROBNP  22646.0*  22776.00*  7750.0*    CBG: No results found for this basename: GLUCAP,  in the last 168 hours   Radiological Exams on Admission: Ct Abdomen Pelvis Wo  Contrast   05/22/2013   *RADIOLOGY REPORT*  Clinical Data: Abdominal pain, left lower quadrant pain, history prostate cancer with osseous metastases, chronic kidney disease, hypertension, coronary artery disease post MI, asthma, CHF, diabetes  CT ABDOMEN AND PELVIS WITHOUT CONTRAST  Technique:  Multidetector CT imaging of the abdomen and pelvis was performed following the standard protocol without intravenous contrast. Patient drank dilute oral contrast for study.  Sagittal and coronal MPR images reconstructed from axial data set.  Comparison: 01/1991 1014  Findings: Atelectasis at lung bases. Scattered peribronchial thickening. Focal area of opacity at the medial right lower lobe may represent persistent infiltrate though mass is not excluded, area measuring 14 x 11 mm image 7. Tiny nonobstructing calculus mid right kidney image 23. Bilateral hydronephrosis and hydroureter. Marked abnormal soft tissue is identified at the prostate bed bladder base, question prostatic enlargement though cannot exclude prostate cancer or bladder cancer, area overall measuring 8.9 x 8.1 x 5.6  cm. Foley catheter within urinary bladder. Remaining bladder wall appears thickened.  Within limits of a nonenhanced exam no focal abnormalities of the liver, spleen, pancreas or adrenal glands. Normal appendix. Stomach and bowel loops grossly unremarkable. Scattered atherosclerotic calcifications without aortic aneurysm. No mass, free fluid, or free air. Slightly prominent inguinal canals containing fat. Enlarged left periaortic lymph node 2.0 x 1.5 cm image 26. Numerous sclerotic foci are identified throughout the lumbar spine, pelvis and proximal femora consistent with widespread osseous metastatic disease.  IMPRESSION: Bilateral hydronephrosis secondary to marked enlargement of the prostate gland, cannot exclude prostate cancer or cancer arising from the base of the urinary bladder. Slight increase in size of a left periaortic lymph node.  Question focus of atelectasis or scarring at medial right lower lobe though mass/nodule not excluded 14 x 11 mm, recommend attention on follow-up exams. Extensive osseous metastatic disease.   Original Report Authenticated By: Ulyses Southward, M.D.    Dg Chest 2 View   05/22/2013   *RADIOLOGY REPORT*  Clinical Data: Abdominal pain.  CHEST - 2 VIEW  Comparison: 03/07/2013.  Findings: Trachea is midline.  Heart size normal.  Probable mild bibasilar scarring.  Lungs are otherwise clear.  No pleural fluid.  IMPRESSION: Probable bibasilar scarring.   Original Report Authenticated By: Leanna Battles, M.D.     EKG: Independently reviewed. Normal sinus rhythm at rate of 82, with PACs, no peaked T waves   Assessment/Plan Active Problems:   Hyperkalemia -in the setting of CKD with slightly increased cr and on ARB -will give D50, insulin, given kayexalate in ED, follow and repeat k and further treat accordingly -hold ARB, follow and recheck     Hyponatremia -likely secondary to volume depletion, and diuretic -although he has h/o CHF, he is compensated at this time -will hold lasix for today, gently hydrate, follow and recheck   Probable catheter associated UTI (lower urinary tract infection) -pt has a h/o recurrent UTIs as above,and has chronic indwelling catheter -will place on empiric vanc and rocephin- pending urine cultures, his last urine grew staph that was resistant to PCN and quinolones but sens to oxacillin Abdominal pain -possibly secondary to UTI -CT of abd and pelvis as above with no acute abd findings -treat UTI as above, pain management and follow. Continue PPI -lactic acid level is wnl   Essential hypertension, benign -hold ARB secondary to #1, continue metoprolol   Diabetes mellitus -continue januvia, ssi, follow   Prostate cancer metastatic to bone - followed by Dr Lynnae Sandhoff, follow up oupt Chronic indwelling Foley catheter with chronic hematuria -monitor and consider inpatient  urology consult vs outpt follow pending clinical course -Followed by Dr. Lynnae Sandhoff   Metabolic acidosis -possibly secondary to acute on CKD -his BG are ok controlled at this time and     Chronic systolic CHF (congestive heart failure) -clinically appears compensated at this time -monitor fluid status closely with gentle hydration today, follow and resume lasix in am as appropriate   Acute on CKD (chronic kidney disease) stage 4, GFR 15-29 ml/min -cr 2.44 today from 2.2 in May -will hold lasix for today, gently hydrate, follow recheck   H/O bil Hydronephrosis -continue chronic indwelling foley catheter, follow up with urology           Code Status: FULL  Family Communication: NONE at bedside  Disposition Plan: admit to Tele   Time spent: >98mins   Kela Millin [facility] Pager [phone]   If 7PM-7AM, please contact night-coverage [email or web address] Password [email or web address]  05/22/2013, 6:55 PM               Routing History...     Date/Time From To Method   05/22/2013 10:02 PM Donnesha Karg Joselyn Glassman, MD Benjiman Core, MD In Basket   05/22/2013 10:02 PM Kamuela Magos Joselyn Glassman, MD Donita Brooks, MD In [facility]

## 2013-05-28 ENCOUNTER — Non-Acute Institutional Stay (SKILLED_NURSING_FACILITY): Payer: Medicare Other | Admitting: Internal Medicine

## 2013-05-28 DIAGNOSIS — C801 Malignant (primary) neoplasm, unspecified: Secondary | ICD-10-CM

## 2013-05-28 DIAGNOSIS — I5022 Chronic systolic (congestive) heart failure: Secondary | ICD-10-CM

## 2013-05-28 DIAGNOSIS — E119 Type 2 diabetes mellitus without complications: Secondary | ICD-10-CM

## 2013-05-28 DIAGNOSIS — R531 Weakness: Secondary | ICD-10-CM

## 2013-05-28 DIAGNOSIS — C61 Malignant neoplasm of prostate: Secondary | ICD-10-CM

## 2013-05-28 DIAGNOSIS — I509 Heart failure, unspecified: Secondary | ICD-10-CM

## 2013-05-28 DIAGNOSIS — R5381 Other malaise: Secondary | ICD-10-CM

## 2013-05-28 DIAGNOSIS — R5383 Other fatigue: Secondary | ICD-10-CM

## 2013-05-28 DIAGNOSIS — N139 Obstructive and reflux uropathy, unspecified: Secondary | ICD-10-CM

## 2013-05-28 DIAGNOSIS — C7951 Secondary malignant neoplasm of bone: Secondary | ICD-10-CM

## 2013-05-28 NOTE — Progress Notes (Signed)
Patient ID: [name], male   DOB: [birth year], 78 y.o.   MRN: [id number] Provider:  Gwenith Spitz. Renato Gails, D.O., C.M.D. Location:  [facility]  PCP: Leo Grosser, MD  Code Status: full code   Allergies  Allergen Reactions  . Aspirin Other (See Comments)    "makes my stomach hurt real bad."  . Feldene (Piroxicam) Swelling    Chief Complaint: readmission s/p hospitalization with abnormal electrolytes, prostate cancer  HPI: 77 y.o. male   Review of Systems:  Review of Systems  Constitutional: Positive for malaise/fatigue. Negative for fever.  HENT: Negative for congestion.   Eyes: Negative for pain.  Respiratory: Negative for shortness of breath.   Cardiovascular: Negative for chest pain and leg swelling.  Gastrointestinal: Positive for abdominal pain.  Genitourinary: Positive for hematuria.  Musculoskeletal: Positive for joint pain.       Left shoulder  Skin: Negative for rash.       C/o long toenails  Neurological: Positive for weakness. Negative for dizziness and headaches.  Psychiatric/Behavioral: Positive for depression and memory loss.     Past Medical History  Diagnosis Date  . CAD (coronary artery disease)   . Hypertension   . Obesity   . Dyslipidemia   . Anemia   . H/O hemorrhoids   . H/O: GI bleed   . Rhinitis, allergic   . Fatty liver   . Shoulder dislocation 1970    right  . Myocardial infarction 2007  . History of gout   . Asthma   . Arthritis     "all over my whole body" (03/07/2013)  . Chronic kidney disease (CKD), stage III (moderate)     ckd III  . Carpal tunnel syndrome   . Prostate cancer 02/11/13-02/20/13    metastatic prostate cancer  . Bone metastasis   . Fall at home 04/07/2013  . CHF (congestive heart failure)   . Type II diabetes mellitus   . Foley catheter in place     "Coude" (04/08/2013)   Past Surgical History  Procedure Laterality Date  . Transurethral resection of prostate  06/08/2011, 11/08/06  . Nerve  repair Right 1974    "hand" (03/07/2013)  . Cataract extraction w/ intraocular lens  implant, bilateral Bilateral    Social History:   reports that he has quit smoking. He has never used smokeless tobacco. He reports that he does not drink alcohol or use illicit drugs.  Family History  Problem Relation Age of Onset  . Diabetes Mother     Medications: Patient's Medications  New Prescriptions   No medications on file  Previous Medications   ALLOPURINOL (ZYLOPRIM) 100 MG TABLET    Take 200 mg by mouth daily.   BICALUTAMIDE (CASODEX) 50 MG TABLET    Take 50 mg by mouth daily.    FUROSEMIDE (LASIX) 40 MG TABLET    Take 1 tablet (40 mg total) by mouth daily.   ISOSORBIDE MONONITRATE (IMDUR) 60 MG 24 HR TABLET    Take 60 mg by mouth daily.    METOPROLOL SUCCINATE (TOPROL-XL) 25 MG 24 HR TABLET    Take 25 mg by mouth daily.   NITROGLYCERIN (NITROSTAT) 0.4 MG SL TABLET    Place 0.4 mg under the tongue every 5 (five) minutes as needed for chest pain.    OMEPRAZOLE (PRILOSEC) 40 MG CAPSULE    Take 40 mg by mouth daily.   OXYCODONE-ACETAMINOPHEN (PERCOCET/ROXICET) 5-325 MG PER TABLET    Take 1 tablet by mouth every 4 (  four) hours as needed for pain.   SIMVASTATIN (ZOCOR) 20 MG TABLET    Take 20 mg by mouth every evening.   SITAGLIPTIN (JANUVIA) 25 MG TABLET    Take 1 tablet (25 mg total) by mouth daily with breakfast.   TAMSULOSIN HCL (FLOMAX) 0.4 MG CAPS    Take 0.4 mg by mouth daily.   TETRACYCLINE (ACHROMYCIN,SUMYCIN) 500 MG CAPSULE    Take 1 capsule (500 mg total) by mouth 2 (two) times daily before a meal.  Modified Medications   No medications on file  Discontinued Medications   No medications on file     Physical Exam: There were no vitals filed for this visit. Physical Exam  Constitutional:  Frail black male resting in bed, moderate distress--due for pain med  HENT:  Head: Normocephalic and atraumatic.  Right Ear: External ear normal.  Left Ear: External ear normal.  Nose:  Nose normal.  Mouth/Throat: Oropharynx is clear and moist. No oropharyngeal exudate.  Eyes: Conjunctivae and EOM are normal. Pupils are equal, round, and reactive to light.  Neck: Normal range of motion. Neck supple. No JVD present. No tracheal deviation present. No thyromegaly present.  Cardiovascular: Normal rate, regular rhythm, normal heart sounds and intact distal pulses.   Pulmonary/Chest: Effort normal and breath sounds normal. No respiratory distress. He has no rales.  Abdominal: Soft. Bowel sounds are normal. He exhibits no distension and no mass. There is tenderness.  Genitourinary:  Hematuria bright red in foley bag  Musculoskeletal: Normal range of motion. He exhibits tenderness. He exhibits no edema.  Left shoulder tender with ROM at rotator cuff insertion and over humerus  Lymphadenopathy:    He has no cervical adenopathy.  Neurological: He is alert.  Skin: Skin is warm and dry.     Labs reviewed: Basic Metabolic Panel:  Recent Labs  95/62/13 0450 02/09/13 0625  02/12/13 0530  05/22/13 1225 05/22/13 2033 05/22/13 2335 05/24/13 0510  NA 139 139  < > 138  < > 131*  --  134* 135  K 3.7 3.6  < > 3.4*  < > 5.8*  --  5.9* 4.8  CL 98 99  < > 94*  < > 104  --  109 108  CO2 32 32  < > 37*  < > 17*  --  17* 16*  GLUCOSE 185* 122*  < > 160*  < > 166*  --  126* 100*  BUN 45* 35*  < > 29*  < > 47*  --  44* 44*  CREATININE 4.77* 4.09*  < > 3.91*  < > 2.44* 2.20* 2.18* 2.48*  CALCIUM 7.8* 7.9*  < > 8.2*  < > 9.2  --  9.1 8.6  MG 1.3*  --   --  1.4*  --   --   --   --   --   PHOS 3.3 3.2  --   --   --   --   --   --   --   < > = values in this interval not displayed. Liver Function Tests:  Recent Labs  03/21/13 1425 04/07/13 1339 05/22/13 1225  AST 11 18 25   ALT 8 10 24   ALKPHOS 197* 283* 717*  BILITOT 0.7 0.5 0.1*  PROT 6.1 6.0 7.0  ALBUMIN 3.2* 2.6* 3.0*    Recent Labs  05/22/13 1225  LIPASE 65*   No results found for this basename: AMMONIA,  in the last  8760 hours CBC:  Recent  Labs  04/05/13 1107 04/07/13 1339  04/11/13 0640 05/22/13 1225 05/22/13 2033  WBC 6.0 4.7  < > 6.3 5.5 5.3  NEUTROABS 4.0 3.1  --   --  3.4  --   HGB 9.0* 9.0*  < > 9.3* 8.8* 8.8*  HCT 28.5* 26.7*  < > 27.8* 26.3* 26.6*  MCV 80.5 76.9*  < > 78.5 77.8* 78.9  PLT 408* 402*  < > 358 369 366  < > = values in this interval not displayed. Cardiac Enzymes:  Recent Labs  03/07/13 2119 03/08/13 0415 05/22/13 2033  TROPONINI <0.30 <0.30 <0.30   BNP: No components found with this basename: POCBNP,  CBG:  Recent Labs  05/24/13 0606 05/24/13 1057 05/24/13 1603  GLUCAP 91 182* 163*    Imaging and Procedures: Dg Lumbar Spine Complete  03/21/2013 *RADIOLOGY REPORT* Clinical Data: Back pain LUMBAR SPINE - COMPLETE 4+ VIEW Comparison: 02/03/2013 Findings: Multifocal sclerotic foci throughout the visualized bone. Increased L2 superior endplate sclerosis and irregularity, may reflect mild compression deformity. No retropulsion. Atherosclerotic vascular calcifications. Minimal anterolisthesis of L4 on L5. IMPRESSION: Sclerotic foci scattered throughout the visualized bones, in keeping with metastatic disease. Sclerosis/mild irregularity at the L2 vertebral body superior endplate may reflect mild compression deformity. No retropulsion. Original Report Authenticated By: Jearld Lesch, M.D.   Ct Head Wo Contrast  04/07/2013 *RADIOLOGY REPORT* Clinical Data: Fall, right facial trauma, dizziness. Diffuse bony metastatic disease, prostate cancer. CT HEAD WITHOUT CONTRAST CT MAXILLOFACIAL WITHOUT CONTRAST Technique: Multidetector CT imaging of the head and maxillofacial structures were performed using the standard protocol without intravenous contrast. Multiplanar CT image reconstructions of the maxillofacial structures were also generated. Comparison: No similar prior study is available for comparison. Bone scan 02/05/2013 is reviewed. CT HEAD Findings: Examination is degraded  by patient motion. Images were repeated. Cortical volume loss noted with proportional ventricular prominence. Periventricular white matter hypodensity likely indicates small vessel ischemic change. No acute hemorrhage, acute infarction, or mass lesion is identified. Remote bilateral external capsule lacunar infarcts. Deformity of the right medial orbital wall is noted without acute fracture line visible. IMPRESSION: No acute intracranial finding. Right medial orbital wall deformity, detailed assess further below. CT MAXILLOFACIAL Findings: The patient is edentulous. Mild anterior displacement of the mandibular condyle is noted which could indicate subluxation of unknown chronicity. Medial deformity of the medial right orbital wall is noted without fracture line. The intraconal contents are normal as well as the globes. Paranasal sinuses otherwise unremarkable. No fracture or acute osseous abnormality. There is mild right facial subcutaneous fat stranding and swelling. No underlying fluid collection identified. Cervical spine degenerative changes are partly visualized. Bilateral ostiomeatal units are patent. Material within the right external auditory canal is compatible with cerumen. IMPRESSION: Right facial soft tissue swelling without underlying facial bone fracture. Apparent remote deformity of the medial right orbital wall. No acute fracture identified. Original Report Authenticated By: Christiana Pellant, M.D.   Dg Abd Portable 1v  04/11/2013 *RADIOLOGY REPORT* Clinical Data: Abdominal pain nausea and vomiting PORTABLE ABDOMEN - 1 VIEW Comparison: 03/21/2013, 02/07/2013 Findings: Distended small bowel loops are present. This is a single view and does not evaluate for air-fluid levels as an upright view was obtained. Colon is decompressed with moderate stool the right colon. Chronic degenerative changes in the lumbar spine and SI joints and right hip IMPRESSION: Distended small bowel loops suggestive of partial  small bowel obstruction. Original Report Authenticated By: Janeece Riggers, M.D.   Ct Maxillofacial Wo Cm  04/07/2013 *RADIOLOGY REPORT*  Clinical Data: Fall, right facial trauma, dizziness. Diffuse bony metastatic disease, prostate cancer. CT HEAD WITHOUT CONTRAST CT MAXILLOFACIAL WITHOUT CONTRAST Technique: Multidetector CT imaging of the head and maxillofacial structures were performed using the standard protocol without intravenous contrast. Multiplanar CT image reconstructions of the maxillofacial structures were also generated. Comparison: No similar prior study is available for comparison. Bone scan 02/05/2013 is reviewed. CT HEAD Findings: Examination is degraded by patient motion. Images were repeated. Cortical volume loss noted with proportional ventricular prominence. Periventricular white matter hypodensity likely indicates small vessel ischemic change. No acute hemorrhage, acute infarction, or mass lesion is identified. Remote bilateral external capsule lacunar infarcts. Deformity of the right medial orbital wall is noted without acute fracture line visible. IMPRESSION: No acute intracranial finding. Right medial orbital wall deformity, detailed assess further below. CT MAXILLOFACIAL Findings: The patient is edentulous. Mild anterior displacement of the mandibular condyle is noted which could indicate subluxation of unknown chronicity. Medial deformity of the medial right orbital wall is noted without fracture line. The intraconal contents are normal as well as the globes. Paranasal sinuses otherwise unremarkable. No fracture or acute osseous abnormality. There is mild right facial subcutaneous fat stranding and swelling. No underlying fluid collection identified. Cervical spine degenerative changes are partly visualized. Bilateral ostiomeatal units are patent. Material within the right external auditory canal is compatible with cerumen. IMPRESSION: Right facial soft tissue swelling without underlying  facial bone fracture. Apparent remote deformity of the medial right orbital wall. No acute fracture identified. Original Report Authenticated By: Christiana Pellant, M.D.   Assessment/Plan 1. Prostate cancer, primary, with metastasis from prostate to other site check CBC to evaluate Hg and Hct levels Please call to followup with urology, Dr. Patsi Sears, in 2-4 weeks Continues on lupron 2. Secondary malignant neoplasm of bone and bone marrow(198.5) Pain difficult to control Consult palliative care--really would benefit from hospice, but now on rehab benefit 3. Chronic systolic CHF (congestive heart failure) Less difficulty with this as appetite has declined obtain BMP to evaluate electrolytes and kidney function followup with  cardiology in 1-2 weeks. 4. DM (diabetes mellitus) Well controlled at this time due to poor intake, monitor for hypoglycemia 5. Weakness generalized -here for rehab, but very weak and not improving much due to his end stage metastatic prostate cancer to bone 6. Obstructive uropathy -cont foley catheter -f/u with urology as planned -completed abx for staph uti last hospitalization  Functional status: dependent in adls except feeding and transfers  Labs/tests ordered:  Cbc, bmp next draw

## 2013-06-06 ENCOUNTER — Emergency Department (HOSPITAL_COMMUNITY): Payer: Medicare Other

## 2013-06-06 ENCOUNTER — Inpatient Hospital Stay (HOSPITAL_COMMUNITY): Payer: Medicare Other

## 2013-06-06 ENCOUNTER — Inpatient Hospital Stay (HOSPITAL_COMMUNITY)
Admission: EM | Admit: 2013-06-06 | Discharge: 2013-06-10 | DRG: 682 | Disposition: A | Discharge status: Skilled Nursing Facility | Payer: Medicare Other | Attending: Internal Medicine | Admitting: Internal Medicine

## 2013-06-06 ENCOUNTER — Non-Acute Institutional Stay (SKILLED_NURSING_FACILITY): Payer: Medicare Other | Admitting: Internal Medicine

## 2013-06-06 ENCOUNTER — Encounter (HOSPITAL_COMMUNITY): Payer: Self-pay | Admitting: *Deleted

## 2013-06-06 ENCOUNTER — Ambulatory Visit: Payer: Medicare Other | Admitting: Family Medicine

## 2013-06-06 DIAGNOSIS — C7951 Secondary malignant neoplasm of bone: Secondary | ICD-10-CM | POA: Diagnosis present

## 2013-06-06 DIAGNOSIS — N19 Unspecified kidney failure: Secondary | ICD-10-CM

## 2013-06-06 DIAGNOSIS — N179 Acute kidney failure, unspecified: Principal | ICD-10-CM | POA: Diagnosis present

## 2013-06-06 DIAGNOSIS — R0902 Hypoxemia: Secondary | ICD-10-CM

## 2013-06-06 DIAGNOSIS — R42 Dizziness and giddiness: Secondary | ICD-10-CM

## 2013-06-06 DIAGNOSIS — I2510 Atherosclerotic heart disease of native coronary artery without angina pectoris: Secondary | ICD-10-CM | POA: Diagnosis present

## 2013-06-06 DIAGNOSIS — M109 Gout, unspecified: Secondary | ICD-10-CM | POA: Diagnosis present

## 2013-06-06 DIAGNOSIS — R339 Retention of urine, unspecified: Secondary | ICD-10-CM | POA: Diagnosis present

## 2013-06-06 DIAGNOSIS — Z87891 Personal history of nicotine dependence: Secondary | ICD-10-CM

## 2013-06-06 DIAGNOSIS — N189 Chronic kidney disease, unspecified: Secondary | ICD-10-CM

## 2013-06-06 DIAGNOSIS — E119 Type 2 diabetes mellitus without complications: Secondary | ICD-10-CM | POA: Diagnosis present

## 2013-06-06 DIAGNOSIS — E872 Acidosis, unspecified: Secondary | ICD-10-CM | POA: Diagnosis present

## 2013-06-06 DIAGNOSIS — I129 Hypertensive chronic kidney disease with stage 1 through stage 4 chronic kidney disease, or unspecified chronic kidney disease: Secondary | ICD-10-CM | POA: Diagnosis present

## 2013-06-06 DIAGNOSIS — C61 Malignant neoplasm of prostate: Secondary | ICD-10-CM | POA: Diagnosis present

## 2013-06-06 DIAGNOSIS — N184 Chronic kidney disease, stage 4 (severe): Secondary | ICD-10-CM

## 2013-06-06 DIAGNOSIS — E785 Hyperlipidemia, unspecified: Secondary | ICD-10-CM | POA: Diagnosis present

## 2013-06-06 DIAGNOSIS — E43 Unspecified severe protein-calorie malnutrition: Secondary | ICD-10-CM | POA: Diagnosis present

## 2013-06-06 DIAGNOSIS — E669 Obesity, unspecified: Secondary | ICD-10-CM | POA: Diagnosis present

## 2013-06-06 DIAGNOSIS — D649 Anemia, unspecified: Secondary | ICD-10-CM | POA: Diagnosis present

## 2013-06-06 DIAGNOSIS — J45909 Unspecified asthma, uncomplicated: Secondary | ICD-10-CM | POA: Diagnosis present

## 2013-06-06 DIAGNOSIS — I951 Orthostatic hypotension: Secondary | ICD-10-CM

## 2013-06-06 DIAGNOSIS — Z6825 Body mass index (BMI) 25.0-25.9, adult: Secondary | ICD-10-CM

## 2013-06-06 DIAGNOSIS — E875 Hyperkalemia: Secondary | ICD-10-CM | POA: Diagnosis present

## 2013-06-06 DIAGNOSIS — M129 Arthropathy, unspecified: Secondary | ICD-10-CM | POA: Diagnosis present

## 2013-06-06 DIAGNOSIS — R310 Gross hematuria: Secondary | ICD-10-CM | POA: Diagnosis present

## 2013-06-06 DIAGNOSIS — E871 Hypo-osmolality and hyponatremia: Secondary | ICD-10-CM | POA: Diagnosis present

## 2013-06-06 DIAGNOSIS — N1330 Unspecified hydronephrosis: Secondary | ICD-10-CM | POA: Diagnosis present

## 2013-06-06 DIAGNOSIS — Z9119 Patient's noncompliance with other medical treatment and regimen: Secondary | ICD-10-CM

## 2013-06-06 DIAGNOSIS — I10 Essential (primary) hypertension: Secondary | ICD-10-CM

## 2013-06-06 DIAGNOSIS — I252 Old myocardial infarction: Secondary | ICD-10-CM

## 2013-06-06 DIAGNOSIS — Z91199 Patient's noncompliance with other medical treatment and regimen due to unspecified reason: Secondary | ICD-10-CM

## 2013-06-06 DIAGNOSIS — R55 Syncope and collapse: Secondary | ICD-10-CM | POA: Diagnosis present

## 2013-06-06 LAB — CBC WITH DIFFERENTIAL/PLATELET
Eosinophils Relative: 3 % (ref 0–5)
HCT: 21.2 % — ABNORMAL LOW (ref 39.0–52.0)
Hemoglobin: 7.3 g/dL — ABNORMAL LOW (ref 13.0–17.0)
Lymphocytes Relative: 28 % (ref 12–46)
Lymphs Abs: 1.1 10*3/uL (ref 0.7–4.0)
MCV: 77.9 fL — ABNORMAL LOW (ref 78.0–100.0)
Platelets: 293 10*3/uL (ref 150–400)
RBC: 2.72 MIL/uL — ABNORMAL LOW (ref 4.22–5.81)
WBC: 4.1 10*3/uL (ref 4.0–10.5)

## 2013-06-06 LAB — POCT I-STAT TROPONIN I: Troponin i, poc: 0.04 ng/mL (ref 0.00–0.08)

## 2013-06-06 LAB — BASIC METABOLIC PANEL
CO2: 17 mEq/L — ABNORMAL LOW (ref 19–32)
Calcium: 9.1 mg/dL (ref 8.4–10.5)
Glucose, Bld: 150 mg/dL — ABNORMAL HIGH (ref 70–99)
Sodium: 128 mEq/L — ABNORMAL LOW (ref 135–145)

## 2013-06-06 MED ORDER — PANTOPRAZOLE SODIUM 40 MG PO TBEC
80.0000 mg | DELAYED_RELEASE_TABLET | Freq: Every day | ORAL | Status: DC
  Administered 2013-06-07 – 2013-06-10 (×3): 80 mg via ORAL
  Filled 2013-06-06 (×6): qty 2
  Filled 2013-06-06 (×2): qty 1

## 2013-06-06 MED ORDER — OXYCODONE-ACETAMINOPHEN 5-325 MG PO TABS
1.0000 | ORAL_TABLET | ORAL | Status: DC | PRN
  Administered 2013-06-07 – 2013-06-08 (×5): 1 via ORAL
  Filled 2013-06-06 (×6): qty 1

## 2013-06-06 MED ORDER — FUROSEMIDE 10 MG/ML IJ SOLN
80.0000 mg | Freq: Once | INTRAMUSCULAR | Status: DC
  Filled 2013-06-06: qty 8

## 2013-06-06 MED ORDER — HEPARIN SODIUM (PORCINE) 5000 UNIT/ML IJ SOLN
5000.0000 [IU] | Freq: Three times a day (TID) | INTRAMUSCULAR | Status: DC
  Filled 2013-06-06 (×4): qty 1

## 2013-06-06 MED ORDER — SODIUM POLYSTYRENE SULFONATE 15 GM/60ML PO SUSP
60.0000 g | Freq: Once | ORAL | Status: AC
  Administered 2013-06-06: 15 g via ORAL
  Filled 2013-06-06: qty 240

## 2013-06-06 MED ORDER — NITROGLYCERIN 0.4 MG SL SUBL: 0.4000 mg | SUBLINGUAL_TABLET | SUBLINGUAL | Status: DC | PRN

## 2013-06-06 MED ORDER — SIMVASTATIN 20 MG PO TABS
20.0000 mg | ORAL_TABLET | Freq: Every evening | ORAL | Status: DC
  Administered 2013-06-07 – 2013-06-09 (×3): 20 mg via ORAL
  Filled 2013-06-06 (×5): qty 1

## 2013-06-06 MED ORDER — SODIUM CHLORIDE 0.9 % IJ SOLN
3.0000 mL | Freq: Two times a day (BID) | INTRAMUSCULAR | Status: DC
  Administered 2013-06-07 – 2013-06-10 (×4): 3 mL via INTRAVENOUS

## 2013-06-06 MED ORDER — SODIUM CHLORIDE 0.9 % IV SOLN
1.0000 g | INTRAVENOUS | Status: AC
  Administered 2013-06-06: 1 g via INTRAVENOUS
  Filled 2013-06-06: qty 10

## 2013-06-06 MED ORDER — DEXTROSE 50 % IV SOLN
1.0000 | Freq: Once | INTRAVENOUS | Status: AC
  Administered 2013-06-06: 50 mL via INTRAVENOUS
  Filled 2013-06-06: qty 50

## 2013-06-06 MED ORDER — SODIUM CHLORIDE 0.9 % IV SOLN
INTRAVENOUS | Status: DC
  Administered 2013-06-06: 23:00:00 via INTRAVENOUS

## 2013-06-06 MED ORDER — ISOSORBIDE MONONITRATE ER 60 MG PO TB24
60.0000 mg | ORAL_TABLET | Freq: Every day | ORAL | Status: DC
  Filled 2013-06-06: qty 1

## 2013-06-06 MED ORDER — ALBUTEROL SULFATE (5 MG/ML) 0.5% IN NEBU
10.0000 mg | INHALATION_SOLUTION | Freq: Once | RESPIRATORY_TRACT | Status: AC
  Administered 2013-06-06: 10 mg via RESPIRATORY_TRACT
  Filled 2013-06-06: qty 0.5

## 2013-06-06 MED ORDER — SENNOSIDES-DOCUSATE SODIUM 8.6-50 MG PO TABS
1.0000 | ORAL_TABLET | Freq: Every day | ORAL | Status: DC
  Administered 2013-06-09 – 2013-06-10 (×2): 1 via ORAL
  Filled 2013-06-06 (×6): qty 1

## 2013-06-06 MED ORDER — SODIUM CHLORIDE 0.9 % IV BOLUS (SEPSIS)
1000.0000 mL | Freq: Once | INTRAVENOUS | Status: AC
  Administered 2013-06-06: 1000 mL via INTRAVENOUS

## 2013-06-06 MED ORDER — SODIUM POLYSTYRENE SULFONATE 15 GM/60ML PO SUSP
30.0000 g | Freq: Once | ORAL | Status: AC
  Administered 2013-06-07: 30 g via ORAL
  Filled 2013-06-06: qty 120

## 2013-06-06 MED ORDER — INSULIN ASPART 100 UNIT/ML ~~LOC~~ SOLN
5.0000 [IU] | Freq: Once | SUBCUTANEOUS | Status: AC
  Administered 2013-06-06: 5 [IU] via INTRAVENOUS

## 2013-06-06 MED ORDER — SODIUM CHLORIDE 0.9 % IV BOLUS (SEPSIS)
500.0000 mL | Freq: Once | INTRAVENOUS | Status: AC
  Administered 2013-06-07: 500 mL via INTRAVENOUS

## 2013-06-06 MED ORDER — BICALUTAMIDE 50 MG PO TABS
50.0000 mg | ORAL_TABLET | Freq: Every day | ORAL | Status: DC
  Administered 2013-06-07 – 2013-06-10 (×3): 50 mg via ORAL
  Filled 2013-06-06 (×5): qty 1

## 2013-06-06 MED ORDER — METOPROLOL SUCCINATE ER 25 MG PO TB24
25.0000 mg | ORAL_TABLET | Freq: Every day | ORAL | Status: DC
  Administered 2013-06-07 – 2013-06-10 (×3): 25 mg via ORAL
  Filled 2013-06-06 (×6): qty 1

## 2013-06-06 MED ORDER — INSULIN ASPART 100 UNIT/ML ~~LOC~~ SOLN
5.0000 [IU] | Freq: Once | SUBCUTANEOUS | Status: DC
  Filled 2013-06-06: qty 1

## 2013-06-06 MED ORDER — SODIUM BICARBONATE 8.4 % IV SOLN: 50.0000 meq | Freq: Once | INTRAVENOUS | Status: DC

## 2013-06-06 MED ORDER — CALCIUM CARBONATE ANTACID 500 MG PO CHEW
800.0000 mg | CHEWABLE_TABLET | ORAL | Status: DC
  Filled 2013-06-06: qty 4

## 2013-06-06 MED ORDER — TAMSULOSIN HCL 0.4 MG PO CAPS
0.4000 mg | ORAL_CAPSULE | Freq: Every day | ORAL | Status: DC
  Administered 2013-06-07 – 2013-06-10 (×3): 0.4 mg via ORAL
  Filled 2013-06-06 (×6): qty 1

## 2013-06-06 NOTE — ED Notes (Signed)
Report called. Will take the pt up to the floor after ultrasound. Juliette Alcide, RN is aware.

## 2013-06-06 NOTE — ED Notes (Signed)
Per facility patient had syncopal episode while walking to restroom, patient with brief loc per facility, patient initial bp when ems arrived on scene 80's systolic.  Patient a/o x 4 at that time. Patient refused IV per ems, patient placed in trendelenberg position with bp rising to 120's systolic.  Patient with indwelling cath from history of prostate cancer, patient with blood in urine collection bag.

## 2013-06-06 NOTE — ED Notes (Signed)
Receiving nurse called to get report, but this RN was with a pt and told them to call back.

## 2013-06-06 NOTE — H&P (Signed)
[facility] History and Physical  Dilraj Killgore ZOX:[id number] DOB: Jul 07, 1935 DOA: 06/06/2013  Referring physician: ED PCP: Leo Grosser, MD  Chief Complaint: Syncope  HPI: [name] is a 78 y.o. male who presents to the ED after an episode of Syncope at his facility that occurred while walking to restroom.  Brief LOC, no injury, initial SBP on scene 80s and patient AAOx4 at that time per EMS.  Refused IV per EMS.  Patient placed in trendelenburg with BP rising to 120s.  Has indwelling cath and h/o prostate CA, h/o CKD stage 4 with baseline creat of 2.5-2.9.  In ED patient found to be in AKI on CKD, creat 5.01, severely hyperkalemic with potassium of 7.1.  Sodium 128 and orthostatic hypotension noted.  Bladder scan showed only 80 cc of urine in the bladder and his chronic foley is in place and draining.  Initially patient very resistant to letting ED put in an IV for treatment but finally consented after we stressed that this could be fatal if untreated.  Review of Systems: 12 systems reviewed and otherwise negative.  Past Medical History  Diagnosis Date  . CAD (coronary artery disease)   . Hypertension   . Obesity   . Dyslipidemia   . Anemia   . H/O hemorrhoids   . H/O: GI bleed   . Rhinitis, allergic   . Fatty liver   . Shoulder dislocation 1970    right  . Myocardial infarction 2007  . History of gout   . Asthma   . Arthritis     "all over my whole body" (03/07/2013)  . Chronic kidney disease (CKD), stage III (moderate)     ckd III  . Carpal tunnel syndrome   . Prostate cancer 02/11/13-02/20/13    metastatic prostate cancer  . Bone metastasis   . Fall at home 04/07/2013  . CHF (congestive heart failure)   . Type II diabetes mellitus   . Foley catheter in place     "Coude" (04/08/2013)   Past Surgical History  Procedure Laterality Date  . Transurethral resection of prostate  06/08/2011, 11/08/06  . Nerve repair Right 1974    "hand" (03/07/2013)  .  Cataract extraction w/ intraocular lens  implant, bilateral Bilateral    Social History:  reports that he has quit smoking. He has never used smokeless tobacco. He reports that he does not drink alcohol or use illicit drugs.   Allergies  Allergen Reactions  . Aspirin Other (See Comments)    "makes my stomach hurt real bad."  . Feldene (Piroxicam) Swelling    Family History  Problem Relation Age of Onset  . Diabetes Mother     Prior to Admission medications   Medication Sig Start Date End Date Taking? Authorizing Provider  allopurinol (ZYLOPRIM) 100 MG tablet Take 200 mg by mouth daily.    Historical Provider, MD  bicalutamide (CASODEX) 50 MG tablet Take 50 mg by mouth daily.  03/12/13   Historical Provider, MD  furosemide (LASIX) 40 MG tablet Take 1 tablet (40 mg total) by mouth daily. 04/11/13   Richarda Overlie, MD  isosorbide mononitrate (IMDUR) 60 MG 24 hr tablet Take 60 mg by mouth daily.  03/12/13   Historical Provider, MD  losartan (COZAAR) 50 MG tablet Take 50 mg by mouth daily.    Historical Provider, MD  metoprolol succinate (TOPROL-XL) 25 MG 24 hr tablet Take 25 mg by mouth daily.    Historical Provider, MD  nitroGLYCERIN (NITROSTAT) 0.4 MG SL  tablet Place 0.4 mg under the tongue every 5 (five) minutes as needed for chest pain.     Historical Provider, MD  omeprazole (PRILOSEC) 40 MG capsule Take 40 mg by mouth daily.    Historical Provider, MD  oxyCODONE-acetaminophen (PERCOCET/ROXICET) 5-325 MG per tablet Take 1 tablet by mouth every 4 (four) hours as needed for pain. 05/27/13   Tiffany L Reed, DO  sennosides-docusate sodium (SENOKOT-S) 8.6-50 MG tablet Take 1 tablet by mouth daily.    Historical Provider, MD  simvastatin (ZOCOR) 20 MG tablet Take 20 mg by mouth every evening.    Historical Provider, MD  sitaGLIPtin (JANUVIA) 25 MG tablet Take 1 tablet (25 mg total) by mouth daily with breakfast. 05/27/12 06/06/13  Kela Millin, MD  sulfamethoxazole-trimethoprim (BACTRIM DS)  800-160 MG per tablet Take 1 tablet by mouth 2 (two) times daily.    Historical Provider, MD  Tamsulosin HCl (FLOMAX) 0.4 MG CAPS Take 0.4 mg by mouth daily.    Historical Provider, MD   Physical Exam: Filed Vitals:   06/06/13 1834 06/06/13 1900 06/06/13 2006 06/06/13 2121  BP: 62/39 122/69 110/53 110/45  Pulse: 65 62  66  Temp:      TempSrc:      Resp:  18 19 17   SpO2:  100% 100% 94%    General:  NAD, resting comfortably in bed Eyes: PEERLA EOMI ENT: mucous membranes moist Neck: supple w/o JVD Cardiovascular: RRR w/o MRG Respiratory: CTA B Abdomen: soft, nt, nd, bs+ Skin: no rash nor lesion Musculoskeletal: MAE, full ROM all 4 extremities Psychiatric: normal tone and affect Neurologic: AAOx3, grossly non-focal  Labs on Admission:  Basic Metabolic Panel:  Recent Labs Lab 06/06/13 1735  NA 128*  K 7.1*  CL 101  CO2 17*  GLUCOSE 150*  BUN 66*  CREATININE 5.01*  CALCIUM 9.1   Liver Function Tests: No results found for this basename: AST, ALT, ALKPHOS, BILITOT, PROT, ALBUMIN,  in the last 168 hours No results found for this basename: LIPASE, AMYLASE,  in the last 168 hours No results found for this basename: AMMONIA,  in the last 168 hours CBC:  Recent Labs Lab 06/06/13 1735  WBC 4.1  NEUTROABS 2.6  HGB 7.3*  HCT 21.2*  MCV 77.9*  PLT 293   Cardiac Enzymes: No results found for this basename: CKTOTAL, CKMB, CKMBINDEX, TROPONINI,  in the last 168 hours  BNP (last 3 results)  Recent Labs  03/07/13 1010 03/21/13 1425 05/22/13 1226  PROBNP 22646.0* 22776.00* 7750.0*   CBG: No results found for this basename: GLUCAP,  in the last 168 hours  Radiological Exams on Admission: Dg Chest 1 View  06/06/2013   *RADIOLOGY REPORT*  Clinical Data: Loss of consciousness.  CHEST - 1 VIEW  Comparison: Abdominal CT 05/22/2013.  Chest radiographs 05/22/2013.  Findings: 1720 hours.  The heart size and mediastinal contours are stable.  The lungs appear clear.  There is  no pleural effusion or pneumothorax.  Diffuse blastic metastases are again demonstrated throughout the visualized skeleton.  Telemetry leads overlie the chest.  IMPRESSION: Widespread blastic metastatic disease.  No acute cardiopulmonary process identified.   Original Report Authenticated By: Carey Bullocks, M.D.   Dg Hip Complete Left  06/06/2013   *RADIOLOGY REPORT*  Clinical Data: Left hip pain.  Loss of consciousness.  History of metastatic prostate cancer.  LEFT HIP - COMPLETE 2+ VIEW  Comparison: Pelvic CT 05/22/2013.  Findings: Blastic metastases throughout the pelvis and proximal femurs are again  noted.  There is no evidence of pathologic fracture or dislocation.  Mild degenerative changes of both hips and sacroiliac joints are noted.  There are scattered vascular calcifications.  IMPRESSION: No acute osseous findings.  Widespread blastic metastatic disease.   Original Report Authenticated By: Carey Bullocks, M.D.   Ct Head Wo Contrast  06/06/2013   *RADIOLOGY REPORT*  Clinical Data: Dizziness.  Fall.  Blunt trauma to posterior head. Loss of consciousness.  CT HEAD WITHOUT CONTRAST  Technique:  Contiguous axial images were obtained from the base of the skull through the vertex without contrast.  Comparison: 04/07/2013  Findings: There is no evidence of intracranial hemorrhage, brain edema or other signs of acute infarction.  There is no evidence of intracranial mass lesion or mass effect.  No abnormal extra-axial fluid collections are identified.  Moderate diffuse cerebral atrophy is stable in appearance. Moderate to severe chronic small vessel disease is also unchanged in appearance.  Ventricles are stable in size.  No abnormal extra- axial fluid collections are seen.  No evidence of skull fracture or pneumocephalus.  IMPRESSION:  1.  No acute intracranial findings. 2.  Stable diffuse cerebral atrophy and chronic small vessel disease.   Original Report Authenticated By: Myles Rosenthal, M.D.    EKG:  Independently reviewed.  Assessment/Plan Principal Problem:   Acute on chronic renal failure Active Problems:   Hyperkalemia   Hyponatremia   CKD (chronic kidney disease) stage 4, GFR 15-29 ml/min   Syncope   1. AKI on CKD stage 4 - With hyperkalemia of 7.1 Ordered 60 kayexalate now and another 30 in 4 hours, insulin, dextrose, IVF because he looks clinically dry, holding nephrotoxic medications, amp of bicarb now, amp of bicarb in a couple of hours, repeat K in 4 hours, nephrology to see patient in AM.  If repeat K higher then give 160mg  of lasix and let nephrology know. 2. Syncope - believed to be due to orthostatic hypotension from dehydration, rehydrating. 3. Hyponatremia - dehydration  Spoke with Dr. Briant Cedar who gave the above recs for hyperkalemia and will see patient in AM.  Code Status: Full Code (must indicate code status--if unknown or must be presumed, indicate so) Family Communication: No family in room (indicate person spoken with, if applicable, with phone number if by telephone) Disposition Plan: Admit to inpatient (indicate anticipated LOS)  Time spent: 70 min  Itzamar Traynor M. [facility] Pager [phone]  If 7PM-7AM, please contact night-coverage [email or web address] Password [facility] 06/06/2013, 9:37 PM

## 2013-06-06 NOTE — ED Provider Notes (Signed)
History    CSN: [id number] Arrival date & time 06/06/13  1545  First MD Initiated Contact with Patient 06/06/13 1622     Chief Complaint  Patient presents with  . Loss of Consciousness   (Consider location/radiation/quality/duration/timing/severity/associated sxs/prior Treatment) Patient is a 78 y.o. male presenting with fall and hip pain. The history is provided by the patient.  Fall This is a recurrent problem. The current episode started more than 1 month ago (2 motnhs ago). The problem has been gradually worsening. Associated symptoms include arthralgias (Left hip left shoulder). Pertinent negatives include no abdominal pain, anorexia, change in bowel habit, chest pain, chills, congestion, coughing, diaphoresis, fatigue, fever, headaches, myalgias, nausea, neck pain, numbness, rash, sore throat, swollen glands, urinary symptoms, vomiting or weakness. The symptoms are aggravated by standing. He has tried nothing for the symptoms.  Hip Pain This is a new (right hip pain) problem. The current episode started today (after fall). The problem occurs constantly. The problem has been unchanged. Associated symptoms include arthralgias (Left hip left shoulder). Pertinent negatives include no abdominal pain, anorexia, change in bowel habit, chest pain, chills, congestion, coughing, diaphoresis, fatigue, fever, headaches, myalgias, nausea, neck pain, numbness, rash, sore throat, swollen glands, urinary symptoms, vomiting or weakness. The symptoms are aggravated by bending. He has tried nothing for the symptoms.   Past Medical History  Diagnosis Date  . CAD (coronary artery disease)   . Hypertension   . Obesity   . Dyslipidemia   . Anemia   . H/O hemorrhoids   . H/O: GI bleed   . Rhinitis, allergic   . Fatty liver   . Shoulder dislocation 1970    right  . Myocardial infarction 2007  . History of gout   . Asthma   . Arthritis     "all over my whole body" (03/07/2013)  . Chronic kidney  disease (CKD), stage III (moderate)     ckd III  . Carpal tunnel syndrome   . Prostate cancer 02/11/13-02/20/13    metastatic prostate cancer  . Bone metastasis   . Fall at home 04/07/2013  . CHF (congestive heart failure)   . Type II diabetes mellitus   . Foley catheter in place     "Coude" (04/08/2013)   Past Surgical History  Procedure Laterality Date  . Transurethral resection of prostate  06/08/2011, 11/08/06  . Nerve repair Right 1974    "hand" (03/07/2013)  . Cataract extraction w/ intraocular lens  implant, bilateral Bilateral    Family History  Problem Relation Age of Onset  . Diabetes Mother    History  Substance Use Topics  . Smoking status: Former Games developer  . Smokeless tobacco: Never Used     Comment: QUIT "YEARS" AGO  . Alcohol Use: No     Comment: former heavy use    Review of Systems  Constitutional: Negative for fever, chills, diaphoresis, activity change, appetite change and fatigue.  HENT: Negative for congestion, sore throat, rhinorrhea, sneezing, trouble swallowing and neck pain.   Eyes: Negative for pain and redness.  Respiratory: Positive for chest tightness. Negative for cough, choking, shortness of breath, wheezing and stridor.   Cardiovascular: Negative for chest pain and leg swelling.  Gastrointestinal: Negative for nausea, vomiting, abdominal pain, diarrhea, constipation, blood in stool, abdominal distention, anal bleeding, anorexia and change in bowel habit.  Musculoskeletal: Positive for arthralgias (Left hip left shoulder). Negative for myalgias and back pain.  Skin: Negative for rash.  Neurological: Positive for dizziness, syncope and light-headedness.  Negative for speech difficulty, weakness, numbness and headaches.  Hematological: Negative for adenopathy.  Psychiatric/Behavioral: Negative for confusion.    Allergies  Aspirin and Feldene  Home Medications   No current outpatient prescriptions on file. BP 132/57  Pulse 83  Temp(Src) 98.1 F  (36.7 C) (Oral)  Resp 20  Ht 5\' 7"  (1.702 m)  Wt 159 lb 2.8 oz (72.2 kg)  BMI 24.92 kg/m2  SpO2 100% Physical Exam  Nursing note and vitals reviewed. Constitutional: He is oriented to person, place, and time. He appears well-developed. He appears cachectic.  HENT:  Head: Normocephalic and atraumatic.  Eyes: Conjunctivae and EOM are normal. Pupils are equal, round, and reactive to light. Right eye exhibits no discharge. Left eye exhibits no discharge. Right eye exhibits normal extraocular motion. Left eye exhibits normal extraocular motion. Right pupil is reactive. Left pupil is reactive.  Neck: Normal range of motion. Neck supple. No tracheal deviation present.  Cardiovascular: Normal rate, regular rhythm and normal heart sounds.  Exam reveals no friction rub.   No murmur heard. Pulmonary/Chest: Effort normal and breath sounds normal. No stridor. No respiratory distress. He has no wheezes. He has no rales. He exhibits no tenderness.  Abdominal: Soft. He exhibits no distension. There is no tenderness. There is no rebound and no guarding.  Genitourinary:  Foley catheter in place. Frank blood in Foley bag.  Neurological: He is alert and oriented to person, place, and time.  Skin: Skin is warm.  Psychiatric: He has a normal mood and affect.    ED Course  Procedures (including critical care time) Labs Reviewed  CBC WITH DIFFERENTIAL - Abnormal; Notable for the following:    RBC 2.72 (*)    Hemoglobin 7.3 (*)    HCT 21.2 (*)    MCV 77.9 (*)    RDW 16.1 (*)    All other components within normal limits  BASIC METABOLIC PANEL - Abnormal; Notable for the following:    Sodium 128 (*)    Potassium 7.1 (*)    CO2 17 (*)    Glucose, Bld 150 (*)    BUN 66 (*)    Creatinine, Ser 5.01 (*)    GFR calc non Af Amer 10 (*)    GFR calc Af Amer 12 (*)    All other components within normal limits  POTASSIUM - Abnormal; Notable for the following:    Potassium 7.3 (*)    All other components  within normal limits  POTASSIUM - Abnormal; Notable for the following:    Potassium 6.6 (*)    All other components within normal limits  POTASSIUM  POTASSIUM  POTASSIUM  CBC  BASIC METABOLIC PANEL  URINALYSIS, ROUTINE W REFLEX MICROSCOPIC  TROPONIN I  TROPONIN I  TROPONIN I  POTASSIUM  POTASSIUM  POTASSIUM  CG4 I-STAT (LACTIC ACID)  POCT I-STAT TROPONIN I  TYPE AND SCREEN   Dg Chest 1 View  06/06/2013   *RADIOLOGY REPORT*  Clinical Data: Loss of consciousness.  CHEST - 1 VIEW  Comparison: Abdominal CT 05/22/2013.  Chest radiographs 05/22/2013.  Findings: 1720 hours.  The heart size and mediastinal contours are stable.  The lungs appear clear.  There is no pleural effusion or pneumothorax.  Diffuse blastic metastases are again demonstrated throughout the visualized skeleton.  Telemetry leads overlie the chest.  IMPRESSION: Widespread blastic metastatic disease.  No acute cardiopulmonary process identified.   Original Report Authenticated By: Carey Bullocks, M.D.   Dg Hip Complete Left  06/06/2013   *  RADIOLOGY REPORT*  Clinical Data: Left hip pain.  Loss of consciousness.  History of metastatic prostate cancer.  LEFT HIP - COMPLETE 2+ VIEW  Comparison: Pelvic CT 05/22/2013.  Findings: Blastic metastases throughout the pelvis and proximal femurs are again noted.  There is no evidence of pathologic fracture or dislocation.  Mild degenerative changes of both hips and sacroiliac joints are noted.  There are scattered vascular calcifications.  IMPRESSION: No acute osseous findings.  Widespread blastic metastatic disease.   Original Report Authenticated By: Carey Bullocks, M.D.   Ct Head Wo Contrast  06/06/2013   *RADIOLOGY REPORT*  Clinical Data: Dizziness.  Fall.  Blunt trauma to posterior head. Loss of consciousness.  CT HEAD WITHOUT CONTRAST  Technique:  Contiguous axial images were obtained from the base of the skull through the vertex without contrast.  Comparison: 04/07/2013  Findings:  There is no evidence of intracranial hemorrhage, brain edema or other signs of acute infarction.  There is no evidence of intracranial mass lesion or mass effect.  No abnormal extra-axial fluid collections are identified.  Moderate diffuse cerebral atrophy is stable in appearance. Moderate to severe chronic small vessel disease is also unchanged in appearance.  Ventricles are stable in size.  No abnormal extra- axial fluid collections are seen.  No evidence of skull fracture or pneumocephalus.  IMPRESSION:  1.  No acute intracranial findings. 2.  Stable diffuse cerebral atrophy and chronic small vessel disease.   Original Report Authenticated By: Myles Rosenthal, M.D.   US Renal  06/06/2013   *RADIOLOGY REPORT*  Clinical Data: Acute renal insufficiency.  Hematuria.  RENAL/URINARY TRACT ULTRASOUND COMPLETE  Comparison:  CT 05/22/2013  Findings:  Right Kidney:  8.5 cm.  11 mm cyst in the mid pole.  Mild right hydronephrosis, decreased since prior CT.  Left Kidney:  10.0 cm.  No focal abnormality.  Normal echotexture. Mild to moderate left hydronephrosis, likely slightly improved since prior study.  Bladder:  Foley catheter in place, decompressed.  Soft tissue mass- like area noted adjacent to the decompressed bladder which presumably represents the large prostate seen on prior CT.  Cannot completely exclude bladder mass.  IMPRESSION: Bilateral hydronephrosis, improved since prior CT.  Bladder decompressed with Foley catheter in place.  Large soft tissue mass adjacent to the bladder presumably represents enlarged prostate.   Original Report Authenticated By: Charlett Nose, M.D.   1. Hyperkalemia   2. Renal failure   3. Acute on chronic renal failure   4. CKD (chronic kidney disease) stage 4, GFR 15-29 ml/min   5. Essential hypertension, benign     Date: 06/07/2013  Rate: 75  Rhythm: sinus rhythm  QRS Axis: normal  Intervals: normal  ST/T Wave abnormalities: nonspecific ST changes and nonspecific T wave  changes  Conduction Disutrbances:none, intraventricular block  Narrative Interpretation: sinus rhythm with   Old EKG Reviewed: unchanged   MDM  Helayne Seminole 78 y.o. presents after a syncopal episode. Positive loss of consciousness. On arrival he was noted to be hypotensive with systolic blood pressure in the 80s. Patient was difficult and persistently refused IV. He has a history of metastatic prostate cancer coronary artery disease, hypertension, GI bleed. On initial workup patient was found to have a potassium 7.3. He again refused IV access. The risks associated with not correcting patient's potassium was was discussed with him including death or permanent disability. Patient also has worsening renal function as his creatinine has doubled. CT head negative for acute intracranial abnormality. Chest x-ray similar to  previous chest x-rays with widespread blastic metastatic disease. Patient agreed to IV access and was given calcium gluconate, insulin, dextrose to temporize and correct potassium. He was admitted for multiorgan system dysfunction. Labs, imaging, and EKG reviewed. I discussed this patient's care at my attending, Dr. Ranae Palms.  Sena Hitch, MD 06/07/13 [phone]

## 2013-06-06 NOTE — ED Notes (Signed)
Pt states that he cannot take the ordered Tums because "i ain't got teeth".

## 2013-06-06 NOTE — Progress Notes (Signed)
Patient ID: [name], male   DOB: [birth year], 78 y.o.   MRN: [id number]  Chief Complaint  Patient presents with  . low blood pressure  . Dizziness   Allergies  Allergen Reactions  . Aspirin Other (See Comments)    "makes my stomach hurt real bad."  . Feldene (Piroxicam) Swelling    Code status- full code  HPI- 78 y/o male patient here for STR was asked to be seen as he was feeling lightheaded. He complaints of dizziness with change in position. His bp with him sitting is 80/40 average. Tried to make him stand to check for orthostatics but pt unable to tolerate it. His heart rate is normal. His o2 saturation is low at 785 on room air. He denies any chest pain or problem with breathing Denies any palpitation Denies any pain No nausea or vomiting No ringing in his ears Has a foley in place and has hematuria  Ros- see above  Current Outpatient Prescriptions on File Prior to Visit  Medication Sig Dispense Refill  . allopurinol (ZYLOPRIM) 100 MG tablet Take 200 mg by mouth daily.      . bicalutamide (CASODEX) 50 MG tablet Take 50 mg by mouth daily.       . furosemide (LASIX) 40 MG tablet Take 1 tablet (40 mg total) by mouth daily.  30 tablet  0  . isosorbide mononitrate (IMDUR) 60 MG 24 hr tablet Take 60 mg by mouth daily.       . metoprolol succinate (TOPROL-XL) 25 MG 24 hr tablet Take 25 mg by mouth daily.      . nitroGLYCERIN (NITROSTAT) 0.4 MG SL tablet Place 0.4 mg under the tongue every 5 (five) minutes as needed for chest pain.       Marland Kitchen omeprazole (PRILOSEC) 40 MG capsule Take 40 mg by mouth daily.      Marland Kitchen oxyCODONE-acetaminophen (PERCOCET/ROXICET) 5-325 MG per tablet Take 1 tablet by mouth every 4 (four) hours as needed for pain.  180 tablet  0  . simvastatin (ZOCOR) 20 MG tablet Take 20 mg by mouth every evening.      . sitaGLIPtin (JANUVIA) 25 MG tablet Take 1 tablet (25 mg total) by mouth daily with breakfast.  30 tablet  0  . Tamsulosin HCl (FLOMAX) 0.4 MG CAPS Take 0.4  mg by mouth daily.       No current facility-administered medications on file prior to visit.   BP 84/40  Pulse 68  Temp(Src) 97.8 F (36.6 C)  Resp 16  SpO2 78%  gen- elderly male , restless and in mild distress heent- mild pallor, no icterus, no LAD, MMM, no JVD cvs- ns 1,s2, rrr, bp similar in both arms respi- decreased air entry b/l, no wheeze or crackles or rhonchi appreciated abdo- bs+, soft, non tender, no suprapubic tenderness, has foley catheter with gross hematuria Ext- able to move all 4, weakness present Neuro - aaox3, able to provide history Psych- gets agitated easily, restless at present  ASSESSMENT/PLAN  Hypotension- new episode, good pulse , associated with dizziness with change of position, unable to obtain orhtostatic but is clearly orthostatic by symptoms. Has ongoing chronic hematuria. This could be from acute blood loss. With his recent hospitalization and UTI and him having prostate cancer, concerns for another sepsis/ bactermia. Patient will benefit from iv fluids, stat h/h check and transfusion if indicated and sepsis workup.wil hold all his anti hypertensives for now  Hypoxia- new symptom, not dyspneic on exam, normal lung exam,  no signs of fluid overload, concerns for demand ischemia in setting of possible blood loss anemia causing hypoxia. Sending patient to ED for immediate evaluation. Will need to rule out PE in setting of malignancy and hypercoaguable state. Also to rule out other pulmonary causes. Patient put on 100% NRB mask for now  Spent 50 minutes, arranging for EMS, evaluating patient and transfering care.

## 2013-06-07 DIAGNOSIS — R55 Syncope and collapse: Secondary | ICD-10-CM

## 2013-06-07 DIAGNOSIS — N184 Chronic kidney disease, stage 4 (severe): Secondary | ICD-10-CM

## 2013-06-07 DIAGNOSIS — E875 Hyperkalemia: Secondary | ICD-10-CM

## 2013-06-07 DIAGNOSIS — E871 Hypo-osmolality and hyponatremia: Secondary | ICD-10-CM

## 2013-06-07 LAB — OSMOLALITY, URINE: Osmolality, Ur: 358 mOsm/kg — ABNORMAL LOW (ref 390–1090)

## 2013-06-07 LAB — URINALYSIS, ROUTINE W REFLEX MICROSCOPIC
Glucose, UA: NEGATIVE mg/dL
Specific Gravity, Urine: 1.016 (ref 1.005–1.030)
pH: 6 (ref 5.0–8.0)

## 2013-06-07 LAB — BASIC METABOLIC PANEL
CO2: 16 mEq/L — ABNORMAL LOW (ref 19–32)
Calcium: 9 mg/dL (ref 8.4–10.5)
GFR calc non Af Amer: 11 mL/min — ABNORMAL LOW (ref >90)
Potassium: 5.5 mEq/L — ABNORMAL HIGH (ref 3.5–5.1)
Sodium: 132 mEq/L — ABNORMAL LOW (ref 135–145)

## 2013-06-07 LAB — URINE MICROSCOPIC-ADD ON

## 2013-06-07 LAB — OSMOLALITY: Osmolality: 293 mOsm/kg (ref 275–300)

## 2013-06-07 LAB — POTASSIUM
Potassium: 6.6 mEq/L (ref 3.5–5.1)
Potassium: 5.5 mEq/L — ABNORMAL HIGH (ref 3.5–5.1)
Potassium: 5.5 mEq/L — ABNORMAL HIGH (ref 3.5–5.1)

## 2013-06-07 LAB — CBC
Hemoglobin: 6.5 g/dL — CL (ref 13.0–17.0)
MCH: 26.2 pg (ref 26.0–34.0)
Platelets: 293 10*3/uL (ref 150–400)
RBC: 2.48 MIL/uL — ABNORMAL LOW (ref 4.22–5.81)

## 2013-06-07 LAB — TROPONIN I: Troponin I: <0.3 ng/mL (ref <0.30)

## 2013-06-07 LAB — OCCULT BLOOD X 1 CARD TO LAB, STOOL: Fecal Occult Bld: NEGATIVE

## 2013-06-07 MED ORDER — LEUPROLIDE ACETATE 7.5 MG IM KIT
7.5000 mg | PACK | Freq: Once | INTRAMUSCULAR | Status: DC
Start: 2013-06-10 — End: 2013-06-10
  Filled 2013-06-07 (×2): qty 7.5

## 2013-06-07 MED ORDER — DEXTROSE 5 % IV SOLN
1.0000 g | INTRAVENOUS | Status: DC
  Administered 2013-06-07 – 2013-06-10 (×4): 1 g via INTRAVENOUS
  Filled 2013-06-07 (×4): qty 10

## 2013-06-07 MED ORDER — STERILE WATER FOR INJECTION IV SOLN
INTRAVENOUS | Status: AC
  Administered 2013-06-07 – 2013-06-08 (×2): via INTRAVENOUS
  Filled 2013-06-07 (×6): qty 850

## 2013-06-07 MED ORDER — HYDROMORPHONE HCL PF 1 MG/ML IJ SOLN
1.0000 mg | Freq: Once | INTRAMUSCULAR | Status: AC
  Administered 2013-06-07: 1 mg via INTRAVENOUS
  Filled 2013-06-07: qty 1

## 2013-06-07 NOTE — Progress Notes (Addendum)
CRITICAL VALUE ALERT  Critical value received:  hgb 6.5/19.4  Date of notification:  06/07/2013  Time of notification:  07:26 am  Critical value read back: yes  Nurse who received alert:  Barnett Applebaum, RN BC, BSN, MSN  MD notified (1st page):  Abrol  Time of first page:  07:27 am  MD notified (2nd page):  Time of second page:  Responding MD:  Susie Cassette  Time MD responded:  07:35 am

## 2013-06-07 NOTE — Consult Note (Signed)
[facility] - CONSULT NOTE Resident Note    Please see below for attending addendum to resident note.   Date: 06/07/2013                  Patient Name:  [name]  MRN: [id number]  DOB: [birth year]  Age / Sex: 78 y.o., male         PCP: Lynnea Ferrier TOM                 Referring Physician: Richarda Overlie, MD                 Reason for Consult: Acute on chronic kidney disease            History of Present Illness: Patient is a 78 y.o. male with a PMHx of CKD stage IV, CHF, HTN, DM2, metastatic prostate cancer to the bone with an indwelling foley 2/2 obnstruction, who was admitted to [facility] on 06/06/2013 2/2 a syncopal episode and was found to be orthostatic and with worsening renal function.   He presented from his SNF after a syncopal episode while walking to the bathroom. He was found to be orthostatic, hyponatremic, hyperkalemic, and acidotic. IVF of normal saline with bicarb, and Kayexalate were started, and his blood pressures, AKI, hyponatremia, and hyperkalemia have improved. A renal u/s was obtained which did show bilateral hydronephrosis, likely post obstructive.   In addition to his CKD and prostate cancer with hydronephrosis and indwelling foley, he does have chronic intermittent hematuria. He does not follow with a Nephrologist in [place].   Medications: Outpatient medications: Prescriptions prior to admission  Medication Sig Dispense Refill  . acetaminophen (TYLENOL) 500 MG tablet Take 500 mg by mouth 2 (two) times daily.      Marland Kitchen allopurinol (ZYLOPRIM) 100 MG tablet Take 200 mg by mouth daily.      . bicalutamide (CASODEX) 50 MG tablet Take 50 mg by mouth daily.       . furosemide (LASIX) 40 MG tablet Take 1 tablet (40 mg total) by mouth daily.  30 tablet  0  . isosorbide mononitrate (IMDUR) 60 MG 24 hr tablet Take 60 mg by mouth daily.       Marland Kitchen losartan (COZAAR) 50 MG tablet Take 50 mg by mouth daily.      . metoprolol succinate (TOPROL-XL) 25 MG 24  hr tablet Take 25 mg by mouth daily.      Marland Kitchen omeprazole (PRILOSEC) 40 MG capsule Take 40 mg by mouth daily.      Marland Kitchen oxyCODONE-acetaminophen (PERCOCET/ROXICET) 5-325 MG per tablet Take 1 tablet by mouth every 4 (four) hours as needed for pain.  180 tablet  0  . sennosides-docusate sodium (SENOKOT-S) 8.6-50 MG tablet Take 1 tablet by mouth daily.      . simvastatin (ZOCOR) 20 MG tablet Take 20 mg by mouth every evening.      . sitaGLIPtin (JANUVIA) 25 MG tablet Take 1 tablet (25 mg total) by mouth daily with breakfast.  30 tablet  0  . Tamsulosin HCl (FLOMAX) 0.4 MG CAPS Take 0.4 mg by mouth daily.      . nitroGLYCERIN (NITROSTAT) 0.4 MG SL tablet Place 0.4 mg under the tongue every 5 (five) minutes as needed for chest pain.       Marland Kitchen sulfamethoxazole-trimethoprim (BACTRIM DS) 800-160 MG per tablet Take 1 tablet by mouth 2 (two) times daily.        Current medications: Current Facility-Administered Medications  Medication Dose Route  Frequency Provider Last Rate Last Dose  . bicalutamide (CASODEX) tablet 50 mg  50 mg Oral Daily Hillary Bow, DO   50 mg at 06/07/13 1030  . cefTRIAXone (ROCEPHIN) 1 g in dextrose 5 % 50 mL IVPB  1 g Intravenous Q24H Richarda Overlie, MD   1 g at 06/07/13 1037  . leuprolide (LUPRON) injection 7.5 mg  7.5 mg Intramuscular Once Marcine Matar, MD      . metoprolol succinate (TOPROL-XL) 24 hr tablet 25 mg  25 mg Oral Daily Hillary Bow, DO   25 mg at 06/07/13 1030  . nitroGLYCERIN (NITROSTAT) SL tablet 0.4 mg  0.4 mg Sublingual Q5 min PRN Gerome Apley Harduk, PA-C      . oxyCODONE-acetaminophen (PERCOCET/ROXICET) 5-325 MG per tablet 1 tablet  1 tablet Oral Q4H PRN Hillary Bow, DO   1 tablet at 06/07/13 [phone]  . pantoprazole (PROTONIX) EC tablet 80 mg  80 mg Oral Daily Hillary Bow, DO   80 mg at 06/07/13 1030  . senna-docusate (Senokot-S) tablet 1 tablet  1 tablet Oral Daily Hillary Bow, DO      . simvastatin (ZOCOR) tablet 20 mg  20 mg Oral QPM Hillary Bow, DO      . sodium bicarbonate injection 50 mEq  50 mEq Intravenous Once Hillary Bow, DO      . sodium chloride 0.9 % injection 3 mL  3 mL Intravenous Q12H Hillary Bow, DO   3 mL at 06/07/13 1037  . tamsulosin (FLOMAX) capsule 0.4 mg  0.4 mg Oral Daily Hillary Bow, DO   0.4 mg at 06/07/13 1030      Allergies: Allergies  Allergen Reactions  . Aspirin Other (See Comments)    "makes my stomach hurt real bad."  . Feldene (Piroxicam) Swelling      Past Medical History: Past Medical History  Diagnosis Date  . CAD (coronary artery disease)   . Hypertension   . Obesity   . Dyslipidemia   . Anemia   . H/O hemorrhoids   . H/O: GI bleed   . Rhinitis, allergic   . Fatty liver   . Shoulder dislocation 1970    right  . Myocardial infarction 2007  . History of gout   . Asthma   . Arthritis     "all over my whole body" (03/07/2013)  . Chronic kidney disease (CKD), stage III (moderate)     ckd III  . Carpal tunnel syndrome   . Prostate cancer 02/11/13-02/20/13    metastatic prostate cancer  . Bone metastasis   . Fall at home 04/07/2013  . CHF (congestive heart failure)   . Type II diabetes mellitus   . Foley catheter in place     "Coude" (04/08/2013)     Past Surgical History: Past Surgical History  Procedure Laterality Date  . Transurethral resection of prostate  06/08/2011, 11/08/06  . Nerve repair Right 1974    "hand" (03/07/2013)  . Cataract extraction w/ intraocular lens  implant, bilateral Bilateral      Family History: Family History  Problem Relation Age of Onset  . Diabetes Mother      Social History:  reports that he has quit smoking. He has never used smokeless tobacco. He reports that he does not drink alcohol or use illicit drugs.   Review of Systems: As per HPI  Vital Signs: Blood pressure 113/52, pulse 71, temperature 97.9 F (36.6 C), temperature source Oral, resp.  rate 16, height 5\' 7"  (1.702 m), weight 159 lb 2.8 oz (72.2 kg),  SpO2 100.00%.  Weight trends: Filed Weights   06/06/13 2332  Weight: 159 lb 2.8 oz (72.2 kg)    Physical Exam: General: Vital signs reviewed and noted. Well-developed, well-nourished, in no acute distress; alert, appropriate and cooperative throughout examination.  Head: Normocephalic, atraumatic.  Eyes: PERRL, EOMI, No signs of anemia or jaundince.  Nose: Mucous membranes moist, not inflammed, nonerythematous.  Throat: Oropharynx nonerythematous, no exudate appreciated.   Neck: No deformities, masses, or tenderness noted.Supple, No carotid Bruits, no JVD.  Lungs:  Normal respiratory effort. Clear to auscultation BL without crackles or wheezes.  Heart: RRR. S1 and S2 normal without gallop, murmur, or rubs.  Abdomen:  BS normoactive. Soft, Nondistended, non-tender.  No masses or organomegaly.  Extremities: No pretibial edema.  Neurologic: A&O X3, CN II - XII are grossly intact. Motor strength is 5/5 in the all 4 extremities, Sensations intact to light touch, Cerebellar signs negative.  Skin: No visible rashes, scars.    Lab results: Basic Metabolic Panel:  Recent Labs Lab 06/06/13 1735  06/07/13 0500 06/07/13 0512 06/07/13 1011  NA 128*  --  132*  --   --   K 7.1*  < > 5.5* 5.5* 5.5*  CL 101  --  105  --   --   CO2 17*  --  16*  --   --   GLUCOSE 150*  --  100*  --   --   BUN 66*  --  62*  --   --   CREATININE 5.01*  --  4.65*  --   --   CALCIUM 9.1  --  9.0  --   --   < > = values in this interval not displayed.  CBC:  Recent Labs Lab 06/06/13 1735 06/07/13 0500  WBC 4.1 3.9*  NEUTROABS 2.6  --   HGB 7.3* 6.5*  HCT 21.2* 19.4*  MCV 77.9* 78.2  PLT 293 293    Cardiac Enzymes:  Recent Labs Lab 06/07/13 0535  TROPONINI <0.30    Microbiology: Results for orders placed during the hospital encounter of 05/22/13  URINE CULTURE     Status: None   Collection Time    05/22/13  1:21 PM      Result Value Range Status   Specimen Description URINE, RANDOM    Final   Special Requests NONE   Final   Culture  Setup Time 05/22/2013 18:39   Final   Colony Count >=100,000 COLONIES/ML   Final   Culture     Final   Value: METHICILLIN RESISTANT STAPHYLOCOCCUS AUREUS     Note: RIFAMPIN AND GENTAMICIN SHOULD NOT BE USED AS SINGLE DRUGS FOR TREATMENT OF STAPH INFECTIONS. CRITICAL RESULT CALLED TO, READ BACK BY AND VERIFIED WITH: JESS S. @ 8:22AM 6.27.14 BY DESIS   Report Status 05/24/2013 FINAL   Final   Organism ID, Bacteria METHICILLIN RESISTANT STAPHYLOCOCCUS AUREUS   Final    Urinalysis:  Recent Labs  06/07/13 0908  COLORURINE RED*  LABSPEC 1.016  PHURINE 6.0  GLUCOSEU NEGATIVE  HGBUR LARGE*  BILIRUBINUR LARGE*  KETONESUR 15*  PROTEINUR >300*  UROBILINOGEN 1.0  NITRITE NEGATIVE  LEUKOCYTESUR MODERATE*     Imaging: Dg Chest 1 View  06/06/2013   *RADIOLOGY REPORT*  Clinical Data: Loss of consciousness.  CHEST - 1 VIEW  Comparison: Abdominal CT 05/22/2013.  Chest radiographs 05/22/2013.  Findings: 1720 hours.  The heart size and  mediastinal contours are stable.  The lungs appear clear.  There is no pleural effusion or pneumothorax.  Diffuse blastic metastases are again demonstrated throughout the visualized skeleton.  Telemetry leads overlie the chest.  IMPRESSION: Widespread blastic metastatic disease.  No acute cardiopulmonary process identified.   Original Report Authenticated By: Carey Bullocks, M.D.   Dg Hip Complete Left  06/06/2013   *RADIOLOGY REPORT*  Clinical Data: Left hip pain.  Loss of consciousness.  History of metastatic prostate cancer.  LEFT HIP - COMPLETE 2+ VIEW  Comparison: Pelvic CT 05/22/2013.  Findings: Blastic metastases throughout the pelvis and proximal femurs are again noted.  There is no evidence of pathologic fracture or dislocation.  Mild degenerative changes of both hips and sacroiliac joints are noted.  There are scattered vascular calcifications.  IMPRESSION: No acute osseous findings.  Widespread blastic  metastatic disease.   Original Report Authenticated By: Carey Bullocks, M.D.   Ct Head Wo Contrast  06/06/2013   *RADIOLOGY REPORT*  Clinical Data: Dizziness.  Fall.  Blunt trauma to posterior head. Loss of consciousness.  CT HEAD WITHOUT CONTRAST  Technique:  Contiguous axial images were obtained from the base of the skull through the vertex without contrast.  Comparison: 04/07/2013  Findings: There is no evidence of intracranial hemorrhage, brain edema or other signs of acute infarction.  There is no evidence of intracranial mass lesion or mass effect.  No abnormal extra-axial fluid collections are identified.  Moderate diffuse cerebral atrophy is stable in appearance. Moderate to severe chronic small vessel disease is also unchanged in appearance.  Ventricles are stable in size.  No abnormal extra- axial fluid collections are seen.  No evidence of skull fracture or pneumocephalus.  IMPRESSION:  1.  No acute intracranial findings. 2.  Stable diffuse cerebral atrophy and chronic small vessel disease.   Original Report Authenticated By: Myles Rosenthal, M.D.   US Renal  06/06/2013   *RADIOLOGY REPORT*  Clinical Data: Acute renal insufficiency.  Hematuria.  RENAL/URINARY TRACT ULTRASOUND COMPLETE  Comparison:  CT 05/22/2013  Findings:  Right Kidney:  8.5 cm.  11 mm cyst in the mid pole.  Mild right hydronephrosis, decreased since prior CT.  Left Kidney:  10.0 cm.  No focal abnormality.  Normal echotexture. Mild to moderate left hydronephrosis, likely slightly improved since prior study.  Bladder:  Foley catheter in place, decompressed.  Soft tissue mass- like area noted adjacent to the decompressed bladder which presumably represents the large prostate seen on prior CT.  Cannot completely exclude bladder mass.  IMPRESSION: Bilateral hydronephrosis, improved since prior CT.  Bladder decompressed with Foley catheter in place.  Large soft tissue mass adjacent to the bladder presumably represents enlarged prostate.    Original Report Authenticated By: Charlett Nose, M.D.     Assessment & Plan: Patient is a 78 y.o. male with a PMHx of CKD stage IV, CHF, HTN, DM2, metastatic prostate cancer to the bone with an indwelling foley 2/2 obnstruction, who was admitted to [facility] on 06/06/2013 2/2 a syncopal episode and was found to be orthostatic and with worsening renal function.   Acute on chronic kidney disease, stage 4: BL Cr b/w 2-3. On admission, Cr up to 5 with GFR 12. Likely 2/2 volume depletion; pt orthostatic on admission, and after fluids BP and renal function have improved. Hyperkalemia 2/2 to acute renal failure. Hyponatremia also likely 2/2 ARF. Will start isotonic bicarbonate solution to improve acidosis and hyperkalemia. Checking osmolality to determine cause of hyponatremia.  - Sterile water  with 3 amps of bicarb @ 100 x3L - Checking serum and urine osmols - F/u serum potassium  Syncope: Pt with fall after walking to the bathroom. Orthostatic on admission, which has improved with IVF. Continue IVF.   Anemia: Hgb 7.3 on admission. After IVF Hgb down to 6.5 today. Pt also with chronic hematuria. Transfusing 1u pRBCs.   DM2: A1c 6.8 on 03/21/13. On Januvia at home which was held on admission. CBGs well controlled.   HTN: On Toprol-XL, Imdur, Losartan and Lasix at home. Orthostatic and hypotensive on admission. Home meds held, except the metoprolol.  Pressures have improved with fluids.   Metastatic prostate cancer: Metastases to the bone. C/o bony pain. Has indwelling catheter 2/2 urethral obstruction. On Leuprolide and Flomax in the hospital. Urology is recommending catheter change and hospice.   DVT PPx: SCDs   Patient history and plan of care reviewed with attending, Dr. Allena Katz.   Genelle Gather, MD  PGYII, Internal Medicine Resident 06/07/2013, 1:15 PM

## 2013-06-07 NOTE — Progress Notes (Signed)
Utilization review completed. Liem Copenhaver, RN, BSN. 

## 2013-06-07 NOTE — Progress Notes (Signed)
Pt very irritable in regards to the bed alarm. Pt is having to get out of bed frequently to have bowel movements (due to kayexalate), and he refuses to call for help before getting up, and he refuses the bed alarm. Pt stated "Don't turn that damn bed alarm back on". Safety precautions explained to pt, but pt remains insistent that he does not want the bed alarm. Red socks and yellow bracelet on. Pt refuses to have his door left open. Will continue to round on pt frequently.

## 2013-06-07 NOTE — Clinical Social Work Psychosocial (Signed)
Clinical Social Work Department BRIEF PSYCHOSOCIAL ASSESSMENT 06/07/2013  Patient:  [name], [name]     Account Number:  [id number]     Admit date:  06/06/2013  Clinical Social Worker:  Delmer Islam  Date/Time:  06/07/2013 05:30 AM  Referred by:  RN  Date Referred:  06/07/2013 Referred for  SNF Placement   Other Referral:   CSW advised by Assistant Director of 6E that patient wanted to talk with CSW as he did not want to return to facility he came from.   Interview type:  Patient Other interview type:   CSW also talked with patient's daughter [name] [phone])    PSYCHOSOCIAL DATA Living Status:  FACILITY Admitted from facility:  [facility] [employer] Level of care:  Skilled Nursing Facility Primary support name:  [name] Primary support relationship to patient:  CHILD, ADULT Degree of support available:   Daughter very involved in patient's care    CURRENT CONCERNS Current Concerns  Post-Acute Placement   Other Concerns:    SOCIAL WORK ASSESSMENT / PLAN Patient requested to talk with CSW as he does not want to return to [facility] to continue his therapy. CSW visited with patient and allowed him to share his thoughts and feelings regarding not returning to facility and offered support. CSW also spoke with daughter later and she also expressed concern for how certain staff spoke with patient and her at the facility. She is in agreement with patient for him not returning there.    Daughter was given SNF list and CSW explained bed search process and advised that she will be given facility responses possibly over the weekend and on Monday.   Assessment/plan status:  Psychosocial Support/Ongoing Assessment of Needs Other assessment/ plan:   Information/referral to community resources:   SNF list for [facility]    PATIENT'S/FAMILY'S RESPONSE TO PLAN OF CARE: Patient not happy at current facility and does not want to return and daughter  in agreement. CSW suggested to daughter that she talk with facility administrator to express her concerns. Daughter expressed appreciation for CSW's assistance.

## 2013-06-07 NOTE — Progress Notes (Signed)
While doing shift report in pt's room with Enid Skeens RN, per pt "I don't want that alarm on. I told you guys earlier!" Pt very adamant about request. Pt educated on safety and fall risks. Pt still refused. Will continue to monitor with doors open. Gilman Schmidt

## 2013-06-07 NOTE — Consult Note (Signed)
I have personally seen and examined this patient and agree with the assessment/plan as outlined above by Sherrine Maples MD (PGY2). 78yo man with metastatic prostate Ca and severe malnutrition who presents with orthostatic dizziness and  AKI on CKD that appears to be due to volume depletion. Also has hyponatremia and hyperkalemia- the latter of which has responded to medical management. Continue isotonic bicarbonate for volume/metabolic acidosis. Not a short or long term HD candidate with prognostic and compliance barriers.   Kaytelynn Scripter K.,MD 06/07/2013 3:39 PM

## 2013-06-07 NOTE — Progress Notes (Addendum)
[facility] PROGRESS NOTE  [name] ZOX:[id number] DOB: 01/18/1935 DOA: 06/06/2013 PCP: Leo Grosser, MD  Assessment/Plan: Principal Problem:   Acute on chronic renal failure Active Problems:   Hyperkalemia   Hyponatremia   CKD (chronic kidney disease) stage 4, GFR 15-29 ml/min   Syncope   Acute and chronic kidney disease stage IV Nephrology aware and will see the patient Renal ultrasound shows bilateral hydronephrosis Likely post obstructive Patient does have some degree of metabolic acidosis with a low bicarbonate  we'll change IV fluids to normal saline with bicarbonate pain with urination with his cath Foley catheter which is chronic . He states he has had intermittent hematuria for a while and she was to followup with Dr. Lynnae Sandhoff Urology consulted  May need CBI given hematuria or replacement of  His foley     Hyperkalemia Received Kayexalate Stable at 5.5  Hyponatremia secondary to dehydration improving  Anemia baseline of 8-9 have some hematuria after placement of Foley, hg down to 6.5  Patient has hematuria Obtain type and screen Slowly transfuse 1 unit of packed red blood cells   Code Status: full Family Communication: family updated about patient's clinical progress Disposition Plan:  As above    Brief narrative: [name] is a 78 y.o. male who presents to the ED after an episode of Syncope at his facility that occurred while walking to restroom. Brief LOC, no injury, initial SBP on scene 80s and patient AAOx4 at that time per EMS. Refused IV per EMS. Patient placed in trendelenburg with BP rising to 120s. Has indwelling cath and h/o prostate CA, h/o CKD stage 4 with baseline creat of 2.5-2.9.  In ED patient found to be in AKI on CKD, creat 5.01, severely hyperkalemic with potassium of 7.1. Sodium 128 and orthostatic hypotension noted. Bladder scan showed only 80 cc of urine in the bladder and his chronic foley is in place and draining.  Initially patient very resistant to letting ED put in an IV for treatment but finally consented after we stressed that this could be fatal if untreated.   Consultants:  Urology   Procedures:  None   Antibiotics: None   HPI/Subjective:    Objective: Filed Vitals:   06/06/13 2148 06/06/13 2258 06/06/13 2332 06/07/13 0406  BP:  132/57  97/48  Pulse:  83  78  Temp:  98.1 F (36.7 C)  97.5 F (36.4 C)  TempSrc:  Oral  Oral  Resp:  20  18  Height:   5\' 7"  (1.702 m)   Weight:   72.2 kg (159 lb 2.8 oz)   SpO2: 100% 100%  100%    Intake/Output Summary (Last 24 hours) at 06/07/13 8119 Last data filed at 06/07/13 1478  Gross per 24 hour  Intake    110 ml  Output    800 ml  Net   -690 ml    Exam:  General: NAD, resting comfortably in bed  Eyes: PEERLA EOMI  ENT: mucous membranes moist  Neck: supple w/o JVD  Cardiovascular: RRR w/o MRG  Respiratory: CTA B  Abdomen: soft, nt, nd, bs+  Skin: no rash nor lesion  Musculoskeletal: MAE, full ROM all 4 extremities  Psychiatric: normal tone and affect  Neurologic: AAOx3, grossly non-focal   .    Data Reviewed: Basic Metabolic Panel:  Recent Labs Lab 06/06/13 1735 06/06/13 2102 06/06/13 2317 06/07/13 0500 06/07/13 0512  NA 128*  --   --  132*  --   K 7.1* 7.3* 6.6*  5.5* 5.5*  CL 101  --   --  105  --   CO2 17*  --   --  16*  --   GLUCOSE 150*  --   --  100*  --   BUN 66*  --   --  62*  --   CREATININE 5.01*  --   --  4.65*  --   CALCIUM 9.1  --   --  9.0  --     Liver Function Tests: No results found for this basename: AST, ALT, ALKPHOS, BILITOT, PROT, ALBUMIN,  in the last 168 hours No results found for this basename: LIPASE, AMYLASE,  in the last 168 hours No results found for this basename: AMMONIA,  in the last 168 hours  CBC:  Recent Labs Lab 06/06/13 1735 06/07/13 0500  WBC 4.1 3.9*  NEUTROABS 2.6  --   HGB 7.3* 6.5*  HCT 21.2* 19.4*  MCV 77.9* 78.2  PLT 293 293    Cardiac  Enzymes:  Recent Labs Lab 06/07/13 0535  TROPONINI <0.30   BNP (last 3 results)  Recent Labs  03/07/13 1010 03/21/13 1425 05/22/13 1226  PROBNP 22646.0* 22776.00* 7750.0*     CBG: No results found for this basename: GLUCAP,  in the last 168 hours  No results found for this or any previous visit (from the past 240 hour(s)).   Studies: Ct Abdomen Pelvis Wo Contrast  05/22/2013   *RADIOLOGY REPORT*  Clinical Data: Abdominal pain, left lower quadrant pain, history prostate cancer with osseous metastases, chronic kidney disease, hypertension, coronary artery disease post MI, asthma, CHF, diabetes  CT ABDOMEN AND PELVIS WITHOUT CONTRAST  Technique:  Multidetector CT imaging of the abdomen and pelvis was performed following the standard protocol without intravenous contrast. Patient drank dilute oral contrast for study.  Sagittal and coronal MPR images reconstructed from axial data set.  Comparison: 01/1991 1014  Findings: Atelectasis at lung bases. Scattered peribronchial thickening. Focal area of opacity at the medial right lower lobe may represent persistent infiltrate though mass is not excluded, area measuring 14 x 11 mm image 7. Tiny nonobstructing calculus mid right kidney image 23. Bilateral hydronephrosis and hydroureter. Marked abnormal soft tissue is identified at the prostate bed bladder base, question prostatic enlargement though cannot exclude prostate cancer or bladder cancer, area overall measuring 8.9 x 8.1 x 5.6 cm. Foley catheter within urinary bladder. Remaining bladder wall appears thickened.  Within limits of a nonenhanced exam no focal abnormalities of the liver, spleen, pancreas or adrenal glands. Normal appendix. Stomach and bowel loops grossly unremarkable. Scattered atherosclerotic calcifications without aortic aneurysm. No mass, free fluid, or free air. Slightly prominent inguinal canals containing fat. Enlarged left periaortic lymph node 2.0 x 1.5 cm image 26. Numerous  sclerotic foci are identified throughout the lumbar spine, pelvis and proximal femora consistent with widespread osseous metastatic disease.  IMPRESSION: Bilateral hydronephrosis secondary to marked enlargement of the prostate gland, cannot exclude prostate cancer or cancer arising from the base of the urinary bladder. Slight increase in size of a left periaortic lymph node. Question focus of atelectasis or scarring at medial right lower lobe though mass/nodule not excluded 14 x 11 mm, recommend attention on follow-up exams. Extensive osseous metastatic disease.   Original Report Authenticated By: Ulyses Southward, M.D.   Dg Chest 1 View  06/06/2013   *RADIOLOGY REPORT*  Clinical Data: Loss of consciousness.  CHEST - 1 VIEW  Comparison: Abdominal CT 05/22/2013.  Chest radiographs 05/22/2013.  Findings: 9604  hours.  The heart size and mediastinal contours are stable.  The lungs appear clear.  There is no pleural effusion or pneumothorax.  Diffuse blastic metastases are again demonstrated throughout the visualized skeleton.  Telemetry leads overlie the chest.  IMPRESSION: Widespread blastic metastatic disease.  No acute cardiopulmonary process identified.   Original Report Authenticated By: Carey Bullocks, M.D.   Dg Chest 2 View  05/22/2013   *RADIOLOGY REPORT*  Clinical Data: Abdominal pain.  CHEST - 2 VIEW  Comparison: 03/07/2013.  Findings: Trachea is midline.  Heart size normal.  Probable mild bibasilar scarring.  Lungs are otherwise clear.  No pleural fluid.  IMPRESSION: Probable bibasilar scarring.   Original Report Authenticated By: Leanna Battles, M.D.   Dg Hip Complete Left  06/06/2013   *RADIOLOGY REPORT*  Clinical Data: Left hip pain.  Loss of consciousness.  History of metastatic prostate cancer.  LEFT HIP - COMPLETE 2+ VIEW  Comparison: Pelvic CT 05/22/2013.  Findings: Blastic metastases throughout the pelvis and proximal femurs are again noted.  There is no evidence of pathologic fracture or  dislocation.  Mild degenerative changes of both hips and sacroiliac joints are noted.  There are scattered vascular calcifications.  IMPRESSION: No acute osseous findings.  Widespread blastic metastatic disease.   Original Report Authenticated By: Carey Bullocks, M.D.   Ct Head Wo Contrast  06/06/2013   *RADIOLOGY REPORT*  Clinical Data: Dizziness.  Fall.  Blunt trauma to posterior head. Loss of consciousness.  CT HEAD WITHOUT CONTRAST  Technique:  Contiguous axial images were obtained from the base of the skull through the vertex without contrast.  Comparison: 04/07/2013  Findings: There is no evidence of intracranial hemorrhage, brain edema or other signs of acute infarction.  There is no evidence of intracranial mass lesion or mass effect.  No abnormal extra-axial fluid collections are identified.  Moderate diffuse cerebral atrophy is stable in appearance. Moderate to severe chronic small vessel disease is also unchanged in appearance.  Ventricles are stable in size.  No abnormal extra- axial fluid collections are seen.  No evidence of skull fracture or pneumocephalus.  IMPRESSION:  1.  No acute intracranial findings. 2.  Stable diffuse cerebral atrophy and chronic small vessel disease.   Original Report Authenticated By: Myles Rosenthal, M.D.   US Renal  06/06/2013   *RADIOLOGY REPORT*  Clinical Data: Acute renal insufficiency.  Hematuria.  RENAL/URINARY TRACT ULTRASOUND COMPLETE  Comparison:  CT 05/22/2013  Findings:  Right Kidney:  8.5 cm.  11 mm cyst in the mid pole.  Mild right hydronephrosis, decreased since prior CT.  Left Kidney:  10.0 cm.  No focal abnormality.  Normal echotexture. Mild to moderate left hydronephrosis, likely slightly improved since prior study.  Bladder:  Foley catheter in place, decompressed.  Soft tissue mass- like area noted adjacent to the decompressed bladder which presumably represents the large prostate seen on prior CT.  Cannot completely exclude bladder mass.  IMPRESSION:  Bilateral hydronephrosis, improved since prior CT.  Bladder decompressed with Foley catheter in place.  Large soft tissue mass adjacent to the bladder presumably represents enlarged prostate.   Original Report Authenticated By: Charlett Nose, M.D.    Scheduled Meds: . bicalutamide  50 mg Oral Daily  . heparin  5,000 Units Subcutaneous Q8H  . isosorbide mononitrate  60 mg Oral Daily  . metoprolol succinate  25 mg Oral Daily  . pantoprazole  80 mg Oral Daily  . senna-docusate  1 tablet Oral Daily  . simvastatin  20 mg  Oral QPM  . sodium bicarbonate  50 mEq Intravenous Once  . sodium chloride  3 mL Intravenous Q12H  . tamsulosin  0.4 mg Oral Daily   Continuous Infusions: . sodium chloride 100 mL/hr at 06/06/13 2326    Principal Problem:   Acute on chronic renal failure Active Problems:   Hyperkalemia   Hyponatremia   CKD (chronic kidney disease) stage 4, GFR 15-29 ml/min   Syncope    Time spent: 40 minutes   [facility]  [facility] Pager [phone]. If 8PM-8AM, please contact night-coverage at [email or web address], password [facility] 06/07/2013, 8:32 AM  LOS: 1 day

## 2013-06-07 NOTE — Progress Notes (Signed)
CRITICAL VALUE ALERT  Critical value received:  K+ 6.6  Date of notification:  06/07/13  Time of notification:  12:10 am  Critical value read back: yes  Nurse who received alert:  Barnett Applebaum, RN BC, BSN, MSN  MD notified (1st page):  DR Georgia Dom  Time of first page:  12:12 am  MD notified (2nd page):  Time of second page:  Responding MD:  Dr. Georgia Dom  Time MD responded:  1220 am

## 2013-06-07 NOTE — Progress Notes (Signed)
INITIAL NUTRITION ASSESSMENT  DOCUMENTATION CODES Per approved criteria  -Severe malnutrition in the context of chronic illness   INTERVENTION: 1. Recommend liberalize diet to low Potassium to allow for greater food choices and promote weight stabilization.   2. Pt would likely benefit from oral nutrition supplements or snacks, but at this time pt refuses for RD to order either of them.   NUTRITION DIAGNOSIS:  Inadequate oral intake related to increased nutrient needs and poor oral intake as evidenced by refusing foods and weight loss.   Goal: PO intake to meet >/=75% estimated nutrition needs  Monitor:  PO intake, diet, weight trends, labs  Reason for Assessment: Malnutrition Screening Tool  78 y.o. male  Admitting Dx: Acute on chronic renal failure  ASSESSMENT: Pt admitted after syncope at SNF, found with elevated K+ and low Na in ED. Pt has hx of metastatic prostate cancer.   RD pulled to pt for report of unintentional weight loss, weight hx shows 38 lb weight loss in the past 4 months (19% body weight).   Pt very irritable regarding being asked so many questions by so many people. Says he is unable to get the foods he wants at the SNF. Refuses to drink any nutrition shakes. States he will eat what he like/knows and will not eating anything else. "I would rather sit here and starve then eat something else". RD attempted to set up snacks for the pt, refused to give any examples of foods he might want to eat. Will ask RN if he is hungry between meals.    Pt meets criteria for severe malnutrition in the context of chronic illness 2/2 weight loss of 19% body weight in the past 4 months and meeting </=75% estimated nutrition needs for >/= 1 month.   Height: Ht Readings from Last 1 Encounters:  06/06/13 5\' 7"  (1.702 m)    Weight: Wt Readings from Last 1 Encounters:  06/06/13 159 lb 2.8 oz (72.2 kg)    Ideal Body Weight: 148 lbs   % Ideal Body Weight: 107%  Wt Readings  from Last 10 Encounters:  06/06/13 159 lb 2.8 oz (72.2 kg)  05/24/13 151 lb 12.8 oz (68.856 kg)  04/11/13 162 lb 14.7 oz (73.9 kg)  04/05/13 161 lb (73.029 kg)  03/21/13 164 lb (74.39 kg)  03/08/13 177 lb (80.287 kg)  02/28/13 195 lb (88.451 kg)  02/22/13 195 lb (88.451 kg)  02/15/13 195 lb (88.451 kg)  02/03/13 197 lb (89.359 kg)  05/25/12 203 lbs   Usual Body Weight: 203 lbs   % Usual Body Weight: 78%  BMI:  Body mass index is 24.92 kg/(m^2). WNL  Estimated Nutritional Needs: Kcal: 2000-2200 Protein: 85-100 gm  Fluid: 1.2 L per current diet orders   Skin: intact   Diet Order: Renal  EDUCATION NEEDS: -Education not appropriate at this time   Intake/Output Summary (Last 24 hours) at 06/07/13 1042 Last data filed at 06/07/13 0900  Gross per 24 hour  Intake    350 ml  Output    800 ml  Net   -450 ml    Last BM: 7/11   Labs:   Recent Labs Lab 06/06/13 1735  06/06/13 2317 06/07/13 0500 06/07/13 0512  NA 128*  --   --  132*  --   K 7.1*  < > 6.6* 5.5* 5.5*  CL 101  --   --  105  --   CO2 17*  --   --  16*  --  BUN 66*  --   --  62*  --   CREATININE 5.01*  --   --  4.65*  --   CALCIUM 9.1  --   --  9.0  --   GLUCOSE 150*  --   --  100*  --   < > = values in this interval not displayed.  CBG (last 3)  No results found for this basename: GLUCAP,  in the last 72 hours  Scheduled Meds: . bicalutamide  50 mg Oral Daily  . cefTRIAXone (ROCEPHIN)  IV  1 g Intravenous Q24H  . metoprolol succinate  25 mg Oral Daily  . pantoprazole  80 mg Oral Daily  . senna-docusate  1 tablet Oral Daily  . simvastatin  20 mg Oral QPM  . sodium bicarbonate  50 mEq Intravenous Once  . sodium chloride  3 mL Intravenous Q12H  . tamsulosin  0.4 mg Oral Daily    Continuous Infusions:   Past Medical History  Diagnosis Date  . CAD (coronary artery disease)   . Hypertension   . Obesity   . Dyslipidemia   . Anemia   . H/O hemorrhoids   . H/O: GI bleed   . Rhinitis,  allergic   . Fatty liver   . Shoulder dislocation 1970    right  . Myocardial infarction 2007  . History of gout   . Asthma   . Arthritis     "all over my whole body" (03/07/2013)  . Chronic kidney disease (CKD), stage III (moderate)     ckd III  . Carpal tunnel syndrome   . Prostate cancer 02/11/13-02/20/13    metastatic prostate cancer  . Bone metastasis   . Fall at home 04/07/2013  . CHF (congestive heart failure)   . Type II diabetes mellitus   . Foley catheter in place     "Coude" (04/08/2013)    Past Surgical History  Procedure Laterality Date  . Transurethral resection of prostate  06/08/2011, 11/08/06  . Nerve repair Right 1974    "hand" (03/07/2013)  . Cataract extraction w/ intraocular lens  implant, bilateral Bilateral     Clarene Duke RD, LDN Pager [phone] After Hours pager [phone]

## 2013-06-07 NOTE — Consult Note (Signed)
Urology Consult  CC:   HPI: 78 year old malewho has a long-standing history of prostate cancer. He has significant metastatic disease, and unfortunately is not on regular hormonal deprivation do to noncompliance with followup. Over the past year and a half, basically the only time I have seen him is when he has been in the hospital with either urinary retention or significant medical issues. He is admitted with gross hematuria, acute on chronic renal insufficiency, and progressive weakening. He has had an indwelling Foley catheter to manage his bladder outlet obstruction for quite some time. He has not had this changed in a while. The patient states that he has had chronic hematuria for some time as well.  He was recently admitted after a fall at a skilled nursing facility. He has a creatinine above 5. Baseline for him is between 2 and 3. He is progressively weakening. He has generalized bony pain. Recent renal ultrasound revealed mild persistent bilateral hydronephrosis.  PMH: Past Medical History  Diagnosis Date  . CAD (coronary artery disease)   . Hypertension   . Obesity   . Dyslipidemia   . Anemia   . H/O hemorrhoids   . H/O: GI bleed   . Rhinitis, allergic   . Fatty liver   . Shoulder dislocation 1970    right  . Myocardial infarction 2007  . History of gout   . Asthma   . Arthritis     "all over my whole body" (03/07/2013)  . Chronic kidney disease (CKD), stage III (moderate)     ckd III  . Carpal tunnel syndrome   . Prostate cancer 02/11/13-02/20/13    metastatic prostate cancer  . Bone metastasis   . Fall at home 04/07/2013  . CHF (congestive heart failure)   . Type II diabetes mellitus   . Foley catheter in place     "Coude" (04/08/2013)    PSH: Past Surgical History  Procedure Laterality Date  . Transurethral resection of prostate  06/08/2011, 11/08/06  . Nerve repair Right 1974    "hand" (03/07/2013)  . Cataract extraction w/ intraocular lens  implant, bilateral  Bilateral     Allergies: Allergies  Allergen Reactions  . Aspirin Other (See Comments)    "makes my stomach hurt real bad."  . Feldene (Piroxicam) Swelling    Medications: Prescriptions prior to admission  Medication Sig Dispense Refill  . acetaminophen (TYLENOL) 500 MG tablet Take 500 mg by mouth 2 (two) times daily.      Marland Kitchen allopurinol (ZYLOPRIM) 100 MG tablet Take 200 mg by mouth daily.      . bicalutamide (CASODEX) 50 MG tablet Take 50 mg by mouth daily.       . furosemide (LASIX) 40 MG tablet Take 1 tablet (40 mg total) by mouth daily.  30 tablet  0  . isosorbide mononitrate (IMDUR) 60 MG 24 hr tablet Take 60 mg by mouth daily.       Marland Kitchen losartan (COZAAR) 50 MG tablet Take 50 mg by mouth daily.      . metoprolol succinate (TOPROL-XL) 25 MG 24 hr tablet Take 25 mg by mouth daily.      Marland Kitchen omeprazole (PRILOSEC) 40 MG capsule Take 40 mg by mouth daily.      Marland Kitchen oxyCODONE-acetaminophen (PERCOCET/ROXICET) 5-325 MG per tablet Take 1 tablet by mouth every 4 (four) hours as needed for pain.  180 tablet  0  . sennosides-docusate sodium (SENOKOT-S) 8.6-50 MG tablet Take 1 tablet by mouth daily.      Marland Kitchen  simvastatin (ZOCOR) 20 MG tablet Take 20 mg by mouth every evening.      . sitaGLIPtin (JANUVIA) 25 MG tablet Take 1 tablet (25 mg total) by mouth daily with breakfast.  30 tablet  0  . Tamsulosin HCl (FLOMAX) 0.4 MG CAPS Take 0.4 mg by mouth daily.      . nitroGLYCERIN (NITROSTAT) 0.4 MG SL tablet Place 0.4 mg under the tongue every 5 (five) minutes as needed for chest pain.       Marland Kitchen sulfamethoxazole-trimethoprim (BACTRIM DS) 800-160 MG per tablet Take 1 tablet by mouth 2 (two) times daily.         Social History: History   Social History  . Marital Status: Divorced    Spouse Name: N/A    Number of Children: N/A  . Years of Education: N/A   Occupational History  . truck driver    Social History Main Topics  . Smoking status: Former Games developer  . Smokeless tobacco: Never Used     Comment:  QUIT "YEARS" AGO  . Alcohol Use: No     Comment: former heavy use  . Drug Use: No  . Sexually Active: No   Other Topics Concern  . Not on file   Social History Narrative  . No narrative on file    Family History: Family History  Problem Relation Age of Onset  . Diabetes Mother     Review of Systems: Positive: bony pain, gross hematuria, progressive weakness, currently with some abdominal pain. Negative:  A further 10 point review of systems was negative except what is listed in the HPI.  Physical Exam: @VITALS2 @ General: No acute distress.  Awake. Head:  Normocephalic.  Atraumatic.He  has temporal wasting ENT:  EOMI.  Mucous membranes dry Neck:  Supple.  No lymphadenopathy. Abdomen: Soft. none tender to palpation. Skin:  Normal turgor.  No visible rash. Extremity: No gross deformity of bilateral upper extremities.  No gross deformity of    bilateral lower extremities. Neurologic: Alert. Appropriate mood.  Foley catheter is properly positioned. There is bloody urine without clots  Studies:  Recent Labs     06/06/13  1735  06/07/13  0500  HGB  7.3*  6.5*  WBC  4.1  3.9*  PLT  293  293    Recent Labs     06/06/13  1735   06/07/13  0500  06/07/13  0512  06/07/13  1011  NA  128*   --   132*   --    --   K  7.1*   < >  5.5*  5.5*  5.5*  CL  101   --   105   --    --   CO2  17*   --   16*   --    --   BUN  66*   --   62*   --    --   CREATININE  5.01*   --   4.65*   --    --   CALCIUM  9.1   --   9.0   --    --   GFRNONAA  10*   --   11*   --    --   GFRAA  12*   --   13*   --    --    < > = values in this interval not displayed.     No results found for this basename: PT, INR, APTT,  in  the last 72 hours   No components found with this basename: ABG,   I reviewed renal ultrasound findings. There is a bladder mass which is secondary to local prostatic invasion  Assessment:  1. Bilateral hydronephrosis, persistent, mild. This is most likely due to a  history of bladder outlet obstruction. I do not think that this is clinically significant at the present time i.e. I think his renal insufficiency is most likely medical/prerenal in nature  2. Metastatic adenocarcinoma the prostate with bony involvement. The patient is clearly deteriorating. He has not had a Lupron shot in a while  3.gross hematuria, urinary retention secondary to local prostatic involvement of his bladder neck which eventually will progress and more than likely totally obstructed ureters  Plan: 1. I do not think he needs continuous bladder irrigation at the present time. He does need his catheter changed, however. Were that  2. I will order a monthly injection of leuprolide for androgen deprivation  3. Unfortunately, I do not have anything to offer regarding his hematuria. He is not a candidate for other therapy of his bladder/prostate i.e. Cauterization, especially due to the fact that he does not have severe bleeding at the present time.  4. I would suggest hospice consultation, as I think he is probably end-stage at this point.    [phone]

## 2013-06-08 DIAGNOSIS — E43 Unspecified severe protein-calorie malnutrition: Secondary | ICD-10-CM | POA: Insufficient documentation

## 2013-06-08 LAB — TYPE AND SCREEN
ABO/RH(D): O POS
Antibody Screen: NEGATIVE

## 2013-06-08 LAB — COMPREHENSIVE METABOLIC PANEL
AST: 26 U/L (ref 0–37)
Albumin: 2.7 g/dL — ABNORMAL LOW (ref 3.5–5.2)
Chloride: 103 mEq/L (ref 96–112)
Creatinine, Ser: 3.31 mg/dL — ABNORMAL HIGH (ref 0.50–1.35)
Sodium: 135 mEq/L (ref 135–145)
Total Bilirubin: 0.3 mg/dL (ref 0.3–1.2)

## 2013-06-08 LAB — CBC
Hemoglobin: 7.7 g/dL — ABNORMAL LOW (ref 13.0–17.0)
MCH: 26.9 pg (ref 26.0–34.0)
MCV: 78.3 fL (ref 78.0–100.0)
RBC: 2.86 MIL/uL — ABNORMAL LOW (ref 4.22–5.81)

## 2013-06-08 LAB — POTASSIUM: Potassium: 4.5 mEq/L (ref 3.5–5.1)

## 2013-06-08 MED ORDER — SODIUM CHLORIDE 0.9 % IV SOLN: INTRAVENOUS | Status: DC

## 2013-06-08 MED ORDER — SODIUM BICARBONATE 650 MG PO TABS
650.0000 mg | ORAL_TABLET | Freq: Two times a day (BID) | ORAL | Status: DC
  Administered 2013-06-08 – 2013-06-10 (×4): 650 mg via ORAL
  Filled 2013-06-08 (×9): qty 1

## 2013-06-08 NOTE — Progress Notes (Signed)
Patient refused AM labs and was very agitated with dietary, yelling  And refusing second breakfast tray.

## 2013-06-08 NOTE — ED Provider Notes (Signed)
I saw and evaluated the patient, reviewed the resident's note and I agree with the findings and plan. Pt is of sound mind. intial refusal of IV. Aware of possible outcome of death for not treating elevated K.  Discussed [facility] who will admit pt.   Loren Racer, MD 06/08/13 [phone]

## 2013-06-08 NOTE — Progress Notes (Addendum)
Pt very irritable this morning. Pt wanted door closed. Due to pt's refusal of bed alarm, pt was informed that door will be left slightly open to monitor patient. Pt became verbally abusive and demanded door to be shut and bed alarm off. Pt educated on fall risks, stating, "I am done listening to you and I don't want to hear a damn thing. I will get you fired if you don't shut that damn door." Pt adamantly has decided to be non compliant.Will continue to monitor. Gilman Schmidt

## 2013-06-08 NOTE — Progress Notes (Signed)
Very agitated this am refuse breakfast, refuse bed alarm, side rails up x 3 , call light in reach, patient instructed to call for assistance as needed.  Refuse to have door left open.

## 2013-06-08 NOTE — Progress Notes (Signed)
[facility] PROGRESS NOTE  [name] XBJ:[id number] DOB: 1935-04-05 DOA: 06/06/2013 PCP: [name] Grosser, MD  Assessment/Plan: Principal Problem:   Acute on chronic renal failure Active Problems:   Hyperkalemia   Hyponatremia   CKD (chronic kidney disease) stage 4, GFR 15-29 ml/min   Syncope   Protein-calorie malnutrition, severe    Acute on chronic kidney disease stage IV  Nephrology following, kidney function improving with IV hydration Renal ultrasound shows bilateral hydronephrosis  Likely post obstructive  Patient does have some degree of metabolic acidosis with a low bicarbonate  Received 3 ampules of bicarbonate pain with urination with his cath Foley catheter which is chronic . 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 male who presents to the ED after an episode of Syncope at his facility that occurred while walking to restroom. Brief LOC, no injury, initial SBP on scene 80s and patient AAOx4 at that time per EMS. Refused IV per EMS. Patient placed in trendelenburg  with BP rising to 120s. Has indwelling cath and h/o prostate CA, h/o CKD stage 4 with baseline creat of 2.5-2.9.  In ED patient found to be in AKI on CKD, creat 5.01, severely hyperkalemic with potassium of 7.1. Sodium 128 and orthostatic hypotension noted. Bladder scan showed only 80 cc of urine in the bladder and his chronic foley is in place and draining. Initially patient very resistant to letting ED put in an IV for treatment but finally consented after we stressed that this could be fatal if untreated.  Consultants:  Urology  Procedures:  None  Antibiotics:  None      HPI/Subjective: Extremely agitated and this morning  Objective: Filed Vitals:   06/07/13 1352 06/07/13 2209 06/08/13 0529 06/08/13 0939  BP: 149/75 137/73 132/49 128/55  Pulse: 77 62 78 80  Temp: 97.8 F (36.6 C) 97.6 F (36.4 C) 98.4 F (36.9 C) 98 F (36.7 C)  TempSrc: Axillary Oral Oral Oral  Resp: 16 18 18 18   Height:      Weight:      SpO2: 98% 97% 100% 100%    Intake/Output Summary (Last 24 hours) at 06/08/13 1027 Last data filed at 06/08/13 0900  Gross per 24 hour  Intake    822 ml  Output      0 ml  Net    822 ml    Exam:  HENT:  Head: Atraumatic.  Nose: Nose normal.  Mouth/Throat: Oropharynx is clear and moist.  Eyes: Conjunctivae are normal. Pupils are equal, round, and reactive to light. No scleral icterus.  Neck: Neck supple. No tracheal deviation present.  Cardiovascular: Normal  rate, regular rhythm, normal heart sounds and intact distal pulses.  Pulmonary/Chest: Effort normal and breath sounds normal. No respiratory distress.  Abdominal: Soft. Normal appearance and bowel sounds are normal. She exhibits no distension. There is no tenderness.  Musculoskeletal: She exhibits no edema and no tenderness.  Neurological: She is alert. No cranial nerve deficit.    Data Reviewed: Basic Metabolic Panel:  Recent Labs Lab 06/06/13 1735  06/07/13 0500 06/07/13 0512 06/07/13 1011  06/07/13 1612 06/08/13 0650  NA 128*  --  132*  --   --   --  135  K 7.1*  < > 5.5* 5.5* 5.5* 5.8* 4.5  4.5  CL 101  --  105  --   --   --  103  CO2 17*  --  16*  --   --   --  23  GLUCOSE 150*  --  100*  --   --   --  85  BUN 66*  --  62*  --   --   --  46*  CREATININE 5.01*  --  4.65*  --   --   --  3.31*  CALCIUM 9.1  --  9.0  --   --   --  8.9  < > = values in this interval not displayed.  Liver Function Tests:  Recent Labs Lab 06/08/13 0650  AST 26  ALT 18  ALKPHOS 687*  BILITOT 0.3  PROT 5.8*  ALBUMIN 2.7*   No results found for this basename: LIPASE, AMYLASE,  in the last 168 hours No results found for this basename: AMMONIA,  in the last 168 hours  CBC:  Recent Labs Lab 06/06/13 1735 06/07/13 0500 06/08/13 0650  WBC 4.1 3.9* 5.3  NEUTROABS 2.6  --   --   HGB 7.3* 6.5* 7.7*  HCT 21.2* 19.4* 22.4*  MCV 77.9* 78.2 78.3  PLT 293 293 268    Cardiac Enzymes:  Recent Labs Lab 06/07/13 0535 06/07/13 1600  TROPONINI <0.30 <0.30   BNP (last 3 results)  Recent Labs  03/07/13 1010 03/21/13 1425 05/22/13 1226  PROBNP 22646.0* 22776.00* 7750.0*     CBG: No results found for this basename: GLUCAP,  in the last 168 hours  No results found for this or any previous visit (from the past 240 hour(s)).   Studies: Ct Abdomen Pelvis Wo Contrast  05/22/2013   *RADIOLOGY REPORT*  Clinical Data: Abdominal pain, left lower quadrant pain, history prostate cancer with osseous metastases, chronic kidney disease, hypertension, coronary artery disease post MI, asthma, CHF, diabetes  CT ABDOMEN AND PELVIS WITHOUT CONTRAST  Technique:  Multidetector CT imaging of the abdomen and pelvis was performed following the standard protocol without intravenous contrast. Patient drank dilute oral contrast for study.  Sagittal and coronal MPR images reconstructed from axial data set.  Comparison: 01/1991 1014  Findings: Atelectasis at lung bases. Scattered peribronchial thickening.  Focal area of opacity at the medial right lower lobe may represent persistent infiltrate though mass is not excluded, area measuring 14 x 11 mm image 7. Tiny nonobstructing calculus mid right kidney image 23. Bilateral hydronephrosis and hydroureter. Marked abnormal soft tissue is identified at the prostate bed bladder base, question prostatic enlargement though cannot exclude prostate cancer or bladder cancer, area overall measuring 8.9 x 8.1 x 5.6 cm. Foley catheter within urinary bladder. Remaining bladder wall appears thickened.  Within limits of a nonenhanced exam no focal abnormalities of the liver, spleen, pancreas or adrenal glands. Normal appendix.  Stomach and bowel loops grossly unremarkable. Scattered atherosclerotic calcifications without aortic aneurysm. No mass, free fluid, or free air. Slightly prominent inguinal canals containing fat. Enlarged left periaortic lymph node 2.0 x 1.5 cm image 26. Numerous sclerotic foci are identified throughout the lumbar spine, pelvis and proximal femora consistent with widespread osseous metastatic disease.  IMPRESSION: Bilateral hydronephrosis secondary to marked enlargement of the prostate gland, cannot exclude prostate cancer or cancer arising from the base of the urinary bladder. Slight increase in size of a left periaortic lymph node. Question focus of atelectasis or scarring at medial right lower lobe though mass/nodule not excluded 14 x 11 mm, recommend attention on follow-up exams. Extensive osseous metastatic disease.   Original Report Authenticated By: Ulyses Southward, M.D.   Dg Chest 1 View  06/06/2013   *RADIOLOGY REPORT*  Clinical Data: Loss of consciousness.  CHEST - 1 VIEW  Comparison: Abdominal CT 05/22/2013.  Chest radiographs 05/22/2013.  Findings: 1720 hours.  The heart size and mediastinal contours are stable.  The lungs appear clear.  There is no pleural effusion or pneumothorax.  Diffuse blastic metastases are again demonstrated throughout the  visualized skeleton.  Telemetry leads overlie the chest.  IMPRESSION: Widespread blastic metastatic disease.  No acute cardiopulmonary process identified.   Original Report Authenticated By: Carey Bullocks, M.D.   Dg Chest 2 View  05/22/2013   *RADIOLOGY REPORT*  Clinical Data: Abdominal pain.  CHEST - 2 VIEW  Comparison: 03/07/2013.  Findings: Trachea is midline.  Heart size normal.  Probable mild bibasilar scarring.  Lungs are otherwise clear.  No pleural fluid.  IMPRESSION: Probable bibasilar scarring.   Original Report Authenticated By: Leanna Battles, M.D.   Dg Hip Complete Left  06/06/2013   *RADIOLOGY REPORT*  Clinical Data: Left hip pain.  Loss of consciousness.  History of metastatic prostate cancer.  LEFT HIP - COMPLETE 2+ VIEW  Comparison: Pelvic CT 05/22/2013.  Findings: Blastic metastases throughout the pelvis and proximal femurs are again noted.  There is no evidence of pathologic fracture or dislocation.  Mild degenerative changes of both hips and sacroiliac joints are noted.  There are scattered vascular calcifications.  IMPRESSION: No acute osseous findings.  Widespread blastic metastatic disease.   Original Report Authenticated By: Carey Bullocks, M.D.   Ct Head Wo Contrast  06/06/2013   *RADIOLOGY REPORT*  Clinical Data: Dizziness.  Fall.  Blunt trauma to posterior head. Loss of consciousness.  CT HEAD WITHOUT CONTRAST  Technique:  Contiguous axial images were obtained from the base of the skull through the vertex without contrast.  Comparison: 04/07/2013  Findings: There is no evidence of intracranial hemorrhage, brain edema or other signs of acute infarction.  There is no evidence of intracranial mass lesion or mass effect.  No abnormal extra-axial fluid collections are identified.  Moderate diffuse cerebral atrophy is stable in appearance. Moderate to severe chronic small vessel disease is also unchanged in appearance.  Ventricles are stable in size.  No abnormal extra- axial fluid  collections are seen.  No evidence of skull fracture or pneumocephalus.  IMPRESSION:  1.  No acute intracranial findings. 2.  Stable diffuse cerebral atrophy and chronic small vessel disease.   Original Report Authenticated By: Myles Rosenthal, M.D.   US Renal  06/06/2013   *RADIOLOGY REPORT*  Clinical Data: Acute renal insufficiency.  Hematuria.  RENAL/URINARY TRACT ULTRASOUND COMPLETE  Comparison:  CT 05/22/2013  Findings:  Right Kidney:  8.5 cm.  11 mm cyst in the mid pole.  Mild right  hydronephrosis, decreased since prior CT.  Left Kidney:  10.0 cm.  No focal abnormality.  Normal echotexture. Mild to moderate left hydronephrosis, likely slightly improved since prior study.  Bladder:  Foley catheter in place, decompressed.  Soft tissue mass- like area noted adjacent to the decompressed bladder which presumably represents the large prostate seen on prior CT.  Cannot completely exclude bladder mass.  IMPRESSION: Bilateral hydronephrosis, improved since prior CT.  Bladder decompressed with Foley catheter in place.  Large soft tissue mass adjacent to the bladder presumably represents enlarged prostate.   Original Report Authenticated By: Charlett Nose, M.D.    Scheduled Meds: . bicalutamide  50 mg Oral Daily  . cefTRIAXone (ROCEPHIN)  IV  1 g Intravenous Q24H  . [START ON 06/10/2013] leuprolide  7.5 mg Intramuscular Once  . metoprolol succinate  25 mg Oral Daily  . pantoprazole  80 mg Oral Daily  . senna-docusate  1 tablet Oral Daily  . simvastatin  20 mg Oral QPM  . sodium bicarbonate  50 mEq Intravenous Once  . sodium chloride  3 mL Intravenous Q12H  . tamsulosin  0.4 mg Oral Daily   Continuous Infusions: .  sodium bicarbonate 150 mEq in sterile water 1000 mL infusion 100 mL/hr at 06/08/13 0205    Principal Problem:   Acute on chronic renal failure Active Problems:   Hyperkalemia   Hyponatremia   CKD (chronic kidney disease) stage 4, GFR 15-29 ml/min   Syncope   Protein-calorie malnutrition,  severe    Time spent: 40 minutes   [facility]  [facility] Pager [phone]. If 8PM-8AM, please contact night-coverage at [email or web address], password [facility] 06/08/2013, 10:27 AM  LOS: 2 days

## 2013-06-08 NOTE — Progress Notes (Signed)
Patient ID: [name], male   DOB: 02-18-1935, 78 y.o.   MRN: [id number]   [facility] Progress Note    Subjective:   Reports that he is aggravated by all the "tests we are running on him"   Objective:   BP 128/55  Pulse 80  Temp(Src) 98 F (36.7 C) (Oral)  Resp 18  Ht 5\' 7"  (1.702 m)  Wt 72.2 kg (159 lb 2.8 oz)  BMI 24.92 kg/m2  SpO2 100%  Intake/Output Summary (Last 24 hours) at 06/08/13 1023 Last data filed at 06/08/13 0900  Gross per 24 hour  Intake    822 ml  Output      0 ml  Net    822 ml   Weight change:   Physical Exam: WUJ:[id number] resting in bed AOZ:HYQMV RRR, normal S1 and S2  Resp:CTA bilaterally, no rales/rhonchi HQI:ONGE, flat, NT, BS normal Ext:No LE edema  Imaging: Dg Chest 1 View  06/06/2013   *RADIOLOGY REPORT*  Clinical Data: Loss of consciousness.  CHEST - 1 VIEW  Comparison: Abdominal CT 05/22/2013.  Chest radiographs 05/22/2013.  Findings: 1720 hours.  The heart size and mediastinal contours are stable.  The lungs appear clear.  There is no pleural effusion or pneumothorax.  Diffuse blastic metastases are again demonstrated throughout the visualized skeleton.  Telemetry leads overlie the chest.  IMPRESSION: Widespread blastic metastatic disease.  No acute cardiopulmonary process identified.   Original Report Authenticated By: Carey Bullocks, M.D.   Dg Hip Complete Left  06/06/2013   *RADIOLOGY REPORT*  Clinical Data: Left hip pain.  Loss of consciousness.  History of metastatic prostate cancer.  LEFT HIP - COMPLETE 2+ VIEW  Comparison: Pelvic CT 05/22/2013.  Findings: Blastic metastases throughout the pelvis and proximal femurs are again noted.  There is no evidence of pathologic fracture or dislocation.  Mild degenerative changes of both hips and sacroiliac joints are noted.  There are scattered vascular calcifications.  IMPRESSION: No acute osseous findings.  Widespread blastic metastatic disease.   Original Report Authenticated  By: Carey Bullocks, M.D.   Ct Head Wo Contrast  06/06/2013   *RADIOLOGY REPORT*  Clinical Data: Dizziness.  Fall.  Blunt trauma to posterior head. Loss of consciousness.  CT HEAD WITHOUT CONTRAST  Technique:  Contiguous axial images were obtained from the base of the skull through the vertex without contrast.  Comparison: 04/07/2013  Findings: There is no evidence of intracranial hemorrhage, brain edema or other signs of acute infarction.  There is no evidence of intracranial mass lesion or mass effect.  No abnormal extra-axial fluid collections are identified.  Moderate diffuse cerebral atrophy is stable in appearance. Moderate to severe chronic small vessel disease is also unchanged in appearance.  Ventricles are stable in size.  No abnormal extra- axial fluid collections are seen.  No evidence of skull fracture or pneumocephalus.  IMPRESSION:  1.  No acute intracranial findings. 2.  Stable diffuse cerebral atrophy and chronic small vessel disease.   Original Report Authenticated By: Myles Rosenthal, M.D.   US Renal  06/06/2013   *RADIOLOGY REPORT*  Clinical Data: Acute renal insufficiency.  Hematuria.  RENAL/URINARY TRACT ULTRASOUND COMPLETE  Comparison:  CT 05/22/2013  Findings:  Right Kidney:  8.5 cm.  11 mm cyst in the mid pole.  Mild right hydronephrosis, decreased since prior CT.  Left Kidney:  10.0 cm.  No focal abnormality.  Normal echotexture. Mild to moderate left hydronephrosis, likely slightly improved since prior study.  Bladder:  Foley catheter  in place, decompressed.  Soft tissue mass- like area noted adjacent to the decompressed bladder which presumably represents the large prostate seen on prior CT.  Cannot completely exclude bladder mass.  IMPRESSION: Bilateral hydronephrosis, improved since prior CT.  Bladder decompressed with Foley catheter in place.  Large soft tissue mass adjacent to the bladder presumably represents enlarged prostate.   Original Report Authenticated By: Charlett Nose, M.D.     Labs: BMET  Recent Labs Lab 06/06/13 1735 06/06/13 2102 06/06/13 2317 06/07/13 0500 06/07/13 0512 06/07/13 1011 06/07/13 1612 06/08/13 0650  NA 128*  --   --  132*  --   --   --  135  K 7.1* 7.3* 6.6* 5.5* 5.5* 5.5* 5.8* 4.5  4.5  CL 101  --   --  105  --   --   --  103  CO2 17*  --   --  16*  --   --   --  23  GLUCOSE 150*  --   --  100*  --   --   --  85  BUN 66*  --   --  62*  --   --   --  46*  CREATININE 5.01*  --   --  4.65*  --   --   --  3.31*  CALCIUM 9.1  --   --  9.0  --   --   --  8.9   CBC  Recent Labs Lab 06/06/13 1735 06/07/13 0500 06/08/13 0650  WBC 4.1 3.9* 5.3  NEUTROABS 2.6  --   --   HGB 7.3* 6.5* 7.7*  HCT 21.2* 19.4* 22.4*  MCV 77.9* 78.2 78.3  PLT 293 293 268   Medications:    . bicalutamide  50 mg Oral Daily  . cefTRIAXone (ROCEPHIN)  IV  1 g Intravenous Q24H  . [START ON 06/10/2013] leuprolide  7.5 mg Intramuscular Once  . metoprolol succinate  25 mg Oral Daily  . pantoprazole  80 mg Oral Daily  . senna-docusate  1 tablet Oral Daily  . simvastatin  20 mg Oral QPM  . sodium bicarbonate  50 mEq Intravenous Once  . sodium chloride  3 mL Intravenous Q12H  . tamsulosin  0.4 mg Oral Daily   Assessment/ Plan:   1. Acute on chronic kidney disease, stage 4: renal function better on IVFs and metabolic acidosis improved on NaHCO3. Will DC IVFs and encourage PO intake. Switch to PO bicarbonate. He has missed follow up with Dr.Powell for renal care in the past (last seen in September 2013)---again he would not be suitable for OP dialysis due to several reason- central to which is his non-compliance. 2. Syncope: Orthostatic and appears clinically better. Stop IVFs and encourage PO intake.  3. Anemia: Hgb 7.3 on admission. After IVF Hgb down to 6.5 today. Pt also with chronic hematuria. Transfusing 1u pRBCs. Reluctant to place on ESA with metastatic CA. 4. DM2: A1c 6.8 on 03/21/13. On Januvia at home which was held on admission. CBGs well  controlled.  5. HTN: On Toprol-XL, Imdur, Losartan and Lasix at home. Orthostatic and hypotensive on admission. Home meds held, except the metoprolol. Pressures have improved with fluids.  6. Metastatic prostate cancer: Metastases to the bone. C/o bony pain. Has indwelling catheter 2/2 urethral obstruction. On Leuprolide and Flomax in the hospital. Urology is recommending catheter change and hospice.    Zetta Bills, MD 06/08/2013, 10:23 AM

## 2013-06-08 NOTE — Progress Notes (Addendum)
Pt noncompliant and has removed telemetry box. Refuses to have it placed back on him. Pt states, "I've been tell all you that I don't want it. Don't put that thing back on me." M. Lynch NP made aware. No new orders placed at this time. Per NP, will notify the rest of the team about pt's decision. CCMT notified. Will continue to monitor. Gilman Schmidt

## 2013-06-09 LAB — CBC
HCT: 23.3 % — ABNORMAL LOW (ref 39.0–52.0)
MCHC: 33.9 g/dL (ref 30.0–36.0)
MCV: 78.7 fL (ref 78.0–100.0)
RDW: 15.8 % — ABNORMAL HIGH (ref 11.5–15.5)

## 2013-06-09 LAB — BASIC METABOLIC PANEL
BUN: 34 mg/dL — ABNORMAL HIGH (ref 6–23)
Creatinine, Ser: 2.52 mg/dL — ABNORMAL HIGH (ref 0.50–1.35)
GFR calc non Af Amer: 23 mL/min — ABNORMAL LOW (ref >90)

## 2013-06-09 NOTE — Progress Notes (Signed)
Patient ID: [name], male   DOB: 10-14-1935, 78 y.o.   MRN: [id number]   Subjective: Patient reports no new complaints/issues. Overall renal function has been improving with hydration. Hemoglobin stable to increased. Patient has widely metastatic locally advanced prostate cancer which has been difficult to treat given the patient's apparent noncompliance. The patient does have a chronic indwelling Foley catheter. His primary urologist had suggested consideration for hospice consultation.  The patient has been restarted on hormonal therapy but is not typically been getting that on a consistent basis. He reports stomach hurts and food does not have enough seasoning.  Objective: Vital signs in last 24 hours: Temp:  [97.3 F (36.3 C)-98 F (36.7 C)] 97.6 F (36.4 C) (07/13 1342) Pulse Rate:  [62-108] 62 (07/13 1342) Resp:  [18-20] 18 (07/13 1342) BP: (119-132)/(55-65) 121/62 mmHg (07/13 1342) SpO2:  [91 %-100 %] 100 % (07/13 1342) Weight:  [74.2 kg (163 lb 9.3 oz)] 74.2 kg (163 lb 9.3 oz) (07/12 2207)  Intake/Output from previous day: 07/12 0701 - 07/13 0700 In: 2261.7 [P.O.:720; I.V.:1491.7; IV Piggyback:50] Out: 2725 [Urine:2725] Intake/Output this shift: Total I/O In: 530 [P.O.:480; IV Piggyback:50] Out: 575 [Urine:575]  Physical Exam:  Constitutional: Vital signs reviewed. WD WN in NAD   Eyes: PERRL, No scleral icterus.   Cardiovascular: RRR Pulmonary/Chest: Normal effort Abdominal: Soft. Non-tender, non-distended, bowel sounds are normal, no masses, organomegaly, or guarding present.  Genitourinary: Indwelling Foley catheter. Extremities: No cyanosis or edema   Lab Results:  Recent Labs  06/07/13 0500 06/08/13 0650 06/09/13 0555  HGB 6.5* 7.7* 7.9*  HCT 19.4* 22.4* 23.3*   BMET  Recent Labs  06/08/13 0650 06/09/13 0555  NA 135 137  K 4.5  4.5 4.0  CL 103 101  CO2 23 26  GLUCOSE 85 84  BUN 46* 34*  CREATININE 3.31* 2.52*  CALCIUM 8.9 8.9   No  results found for this basename: LABPT, INR,  in the last 72 hours No results found for this basename: LABURIN,  in the last 72 hours Results for orders placed during the hospital encounter of 05/22/13  URINE CULTURE     Status: None   Collection Time    05/22/13  1:21 PM      Result Value Range Status   Specimen Description URINE, RANDOM   Final   Special Requests NONE   Final   Culture  Setup Time 05/22/2013 18:39   Final   Colony Count >=100,000 COLONIES/ML   Final   Culture     Final   Value: METHICILLIN RESISTANT STAPHYLOCOCCUS AUREUS     Note: RIFAMPIN AND GENTAMICIN SHOULD NOT BE USED AS SINGLE DRUGS FOR TREATMENT OF STAPH INFECTIONS. CRITICAL RESULT CALLED TO, READ BACK BY AND VERIFIED WITH: JESS S. @ 8:22AM 6.27.14 BY DESIS   Report Status 05/24/2013 FINAL   Final   Organism ID, Bacteria METHICILLIN RESISTANT STAPHYLOCOCCUS AUREUS   Final    Studies/Results: No results found.  Assessment/Plan:   Recommendations per his primary urologist note/consultation ( Dr. Retta Diones). No change currently.   LOS: 3 days   Husam Hohn S 06/09/2013, 5:13 PM

## 2013-06-09 NOTE — Progress Notes (Addendum)
CSW met with pt at bedside to discuss current bed offers of [facility] and [facility]. Patient asked csw to discuss with pt daughter [name]. CSW left message for pt daughter at [phone]. Unit CSW to follow up with pt and pt family regarding bed offer choice. Per discussion with attending patient may be medically stable for discharge tomorrow.    Addendum: 2:28 pm   CSW spoke with pt daughter [name], and discussed current bed offers. Patient daughter plans to visit [facility] today. Patient daughter aware that patient may be medically stable for discharge tomorrow.    Catha Gosselin, LCSWA  [phone] .06/09/2013 11:17am

## 2013-06-09 NOTE — Progress Notes (Signed)
[facility] PROGRESS NOTE  [name] HYQ:[id number] DOB: 03/08/1935 DOA: 06/06/2013 PCP: Leo Grosser, MD  Assessment/Plan: Principal Problem:   Acute on chronic renal failure Active Problems:   Hyperkalemia   Hyponatremia   CKD (chronic kidney disease) stage 4, GFR 15-29 ml/min   Syncope   Protein-calorie malnutrition, severe     Acute on chronic kidney disease stage IV  Nephrology following, kidney function improving with IV hydration  Renal ultrasound shows bilateral hydronephrosis  Likely post obstructive  Patient does have some degree of metabolic acidosis with a low bicarbonate  Received 3 ampules of bicarbonate  pain with urination with his cath Foley catheter which is chronic . He states he has had intermittent hematuria for a while and she was to followup with Dr. 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 male who presents to the ED after an episode of Syncope at his facility that occurred while walking to restroom. Brief LOC, no injury, initial SBP on scene 80s and patient AAOx4 at  that time per EMS. Refused IV per EMS. Patient placed in trendelenburg with BP rising to 120s. Has indwelling cath and h/o prostate CA, h/o CKD stage 4 with baseline creat of 2.5-2.9.  In ED patient found to be in AKI on CKD, creat 5.01, severely hyperkalemic with potassium of 7.1. Sodium 128 and orthostatic hypotension noted. Bladder scan showed only 80 cc of urine in the bladder and his chronic foley is in place and draining. Initially patient very resistant to letting ED put in an IV for treatment but finally consented after we stressed that this could be fatal if untreated.  Consultants:  Urology  Procedures:  None  Antibiotics:  None    HPI/Subjective:  Extremely agitated and this morning   Objective: Filed Vitals:   06/08/13 0939 06/08/13 1800 06/08/13 2207 06/09/13 0605  BP: 128/55 132/65 119/55 125/59  Pulse: 80 76 108 77  Temp: 98 F (36.7 C) 98 F (36.7 C) 97.9 F (36.6 C) 97.3 F (36.3 C)  TempSrc: Oral Oral Oral Oral  Resp: 18 20 19 18   Height:   5\' 7"  (1.702 m)   Weight:   74.2 kg (163 lb 9.3 oz)   SpO2: 100% 100% 91% 100%    Intake/Output Summary (Last 24 hours) at 06/09/13 1027 Last data filed at 06/09/13 0606  Gross per 24 hour  Intake 1971.67 ml  Output   2725 ml  Net -753.33 ml    Exam:  HENT:  Head: Atraumatic.  Nose: Nose normal.  Mouth/Throat: Oropharynx is clear and moist.  Eyes: Conjunctivae are normal. Pupils  are equal, round, and reactive to light. No scleral icterus.  Neck: Neck supple. No tracheal deviation present.  Cardiovascular: Normal rate, regular rhythm, normal heart sounds and intact distal pulses.  Pulmonary/Chest: Effort normal and breath sounds normal. No respiratory distress.  Abdominal: Soft. Normal appearance and bowel sounds are normal. She exhibits no distension. There is no tenderness.  Musculoskeletal: She exhibits no edema and no tenderness.  Neurological: She is alert. No cranial nerve deficit.    Data  Reviewed: Basic Metabolic Panel:  Recent Labs Lab 06/06/13 1735  06/07/13 0500 06/07/13 0512 06/07/13 1011 06/07/13 1612 06/08/13 0650 06/09/13 0555  NA 128*  --  132*  --   --   --  135 137  K 7.1*  < > 5.5* 5.5* 5.5* 5.8* 4.5  4.5 4.0  CL 101  --  105  --   --   --  103 101  CO2 17*  --  16*  --   --   --  23 26  GLUCOSE 150*  --  100*  --   --   --  85 84  BUN 66*  --  62*  --   --   --  46* 34*  CREATININE 5.01*  --  4.65*  --   --   --  3.31* 2.52*  CALCIUM 9.1  --  9.0  --   --   --  8.9 8.9  < > = values in this interval not displayed.  Liver Function Tests:  Recent Labs Lab 06/08/13 0650  AST 26  ALT 18  ALKPHOS 687*  BILITOT 0.3  PROT 5.8*  ALBUMIN 2.7*   No results found for this basename: LIPASE, AMYLASE,  in the last 168 hours No results found for this basename: AMMONIA,  in the last 168 hours  CBC:  Recent Labs Lab 06/06/13 1735 06/07/13 0500 06/08/13 0650 06/09/13 0555  WBC 4.1 3.9* 5.3 5.4  NEUTROABS 2.6  --   --   --   HGB 7.3* 6.5* 7.7* 7.9*  HCT 21.2* 19.4* 22.4* 23.3*  MCV 77.9* 78.2 78.3 78.7  PLT 293 293 268 264    Cardiac Enzymes:  Recent Labs Lab 06/07/13 0535 06/07/13 1600  TROPONINI <0.30 <0.30   BNP (last 3 results)  Recent Labs  03/07/13 1010 03/21/13 1425 05/22/13 1226  PROBNP 22646.0* 22776.00* 7750.0*     CBG: No results found for this basename: GLUCAP,  in the last 168 hours  No results found for this or any previous visit (from the past 240 hour(s)).   Studies: Ct Abdomen Pelvis Wo Contrast  05/22/2013   *RADIOLOGY REPORT*  Clinical Data: Abdominal pain, left lower quadrant pain, history prostate cancer with osseous metastases, chronic kidney disease, hypertension, coronary artery disease post MI, asthma, CHF, diabetes  CT ABDOMEN AND PELVIS WITHOUT CONTRAST  Technique:  Multidetector CT imaging of the abdomen and pelvis was performed following the standard protocol without intravenous contrast. Patient  drank dilute oral contrast for study.  Sagittal and coronal MPR images reconstructed from axial data set.  Comparison: 01/1991 1014  Findings: Atelectasis at lung bases. Scattered peribronchial thickening. Focal area of opacity at the medial right lower lobe may represent persistent infiltrate though mass is not excluded, area measuring 14 x 11 mm image 7. Tiny nonobstructing calculus mid right kidney image 23. Bilateral hydronephrosis and hydroureter. Marked abnormal soft tissue is identified at the prostate bed bladder base, question prostatic enlargement though cannot exclude prostate cancer or  bladder cancer, area overall measuring 8.9 x 8.1 x 5.6 cm. Foley catheter within urinary bladder. Remaining bladder wall appears thickened.  Within limits of a nonenhanced exam no focal abnormalities of the liver, spleen, pancreas or adrenal glands. Normal appendix. Stomach and bowel loops grossly unremarkable. Scattered atherosclerotic calcifications without aortic aneurysm. No mass, free fluid, or free air. Slightly prominent inguinal canals containing fat. Enlarged left periaortic lymph node 2.0 x 1.5 cm image 26. Numerous sclerotic foci are identified throughout the lumbar spine, pelvis and proximal femora consistent with widespread osseous metastatic disease.  IMPRESSION: Bilateral hydronephrosis secondary to marked enlargement of the prostate gland, cannot exclude prostate cancer or cancer arising from the base of the urinary bladder. Slight increase in size of a left periaortic lymph node. Question focus of atelectasis or scarring at medial right lower lobe though mass/nodule not excluded 14 x 11 mm, recommend attention on follow-up exams. Extensive osseous metastatic disease.   Original Report Authenticated By: Ulyses Southward, M.D.   Dg Chest 1 View  06/06/2013   *RADIOLOGY REPORT*  Clinical Data: Loss of consciousness.  CHEST - 1 VIEW  Comparison: Abdominal CT 05/22/2013.  Chest radiographs 05/22/2013.  Findings:  1720 hours.  The heart size and mediastinal contours are stable.  The lungs appear clear.  There is no pleural effusion or pneumothorax.  Diffuse blastic metastases are again demonstrated throughout the visualized skeleton.  Telemetry leads overlie the chest.  IMPRESSION: Widespread blastic metastatic disease.  No acute cardiopulmonary process identified.   Original Report Authenticated By: Carey Bullocks, M.D.   Dg Chest 2 View  05/22/2013   *RADIOLOGY REPORT*  Clinical Data: Abdominal pain.  CHEST - 2 VIEW  Comparison: 03/07/2013.  Findings: Trachea is midline.  Heart size normal.  Probable mild bibasilar scarring.  Lungs are otherwise clear.  No pleural fluid.  IMPRESSION: Probable bibasilar scarring.   Original Report Authenticated By: Leanna Battles, M.D.   Dg Hip Complete Left  06/06/2013   *RADIOLOGY REPORT*  Clinical Data: Left hip pain.  Loss of consciousness.  History of metastatic prostate cancer.  LEFT HIP - COMPLETE 2+ VIEW  Comparison: Pelvic CT 05/22/2013.  Findings: Blastic metastases throughout the pelvis and proximal femurs are again noted.  There is no evidence of pathologic fracture or dislocation.  Mild degenerative changes of both hips and sacroiliac joints are noted.  There are scattered vascular calcifications.  IMPRESSION: No acute osseous findings.  Widespread blastic metastatic disease.   Original Report Authenticated By: Carey Bullocks, M.D.   Ct Head Wo Contrast  06/06/2013   *RADIOLOGY REPORT*  Clinical Data: Dizziness.  Fall.  Blunt trauma to posterior head. Loss of consciousness.  CT HEAD WITHOUT CONTRAST  Technique:  Contiguous axial images were obtained from the base of the skull through the vertex without contrast.  Comparison: 04/07/2013  Findings: There is no evidence of intracranial hemorrhage, brain edema or other signs of acute infarction.  There is no evidence of intracranial mass lesion or mass effect.  No abnormal extra-axial fluid collections are identified.   Moderate diffuse cerebral atrophy is stable in appearance. Moderate to severe chronic small vessel disease is also unchanged in appearance.  Ventricles are stable in size.  No abnormal extra- axial fluid collections are seen.  No evidence of skull fracture or pneumocephalus.  IMPRESSION:  1.  No acute intracranial findings. 2.  Stable diffuse cerebral atrophy and chronic small vessel disease.   Original Report Authenticated By: Myles Rosenthal, M.D.   US Renal  06/06/2013   *  RADIOLOGY REPORT*  Clinical Data: Acute renal insufficiency.  Hematuria.  RENAL/URINARY TRACT ULTRASOUND COMPLETE  Comparison:  CT 05/22/2013  Findings:  Right Kidney:  8.5 cm.  11 mm cyst in the mid pole.  Mild right hydronephrosis, decreased since prior CT.  Left Kidney:  10.0 cm.  No focal abnormality.  Normal echotexture. Mild to moderate left hydronephrosis, likely slightly improved since prior study.  Bladder:  Foley catheter in place, decompressed.  Soft tissue mass- like area noted adjacent to the decompressed bladder which presumably represents the large prostate seen on prior CT.  Cannot completely exclude bladder mass.  IMPRESSION: Bilateral hydronephrosis, improved since prior CT.  Bladder decompressed with Foley catheter in place.  Large soft tissue mass adjacent to the bladder presumably represents enlarged prostate.   Original Report Authenticated By: Charlett Nose, M.D.    Scheduled Meds: . bicalutamide  50 mg Oral Daily  . cefTRIAXone (ROCEPHIN)  IV  1 g Intravenous Q24H  . [START ON 06/10/2013] leuprolide  7.5 mg Intramuscular Once  . metoprolol succinate  25 mg Oral Daily  . pantoprazole  80 mg Oral Daily  . senna-docusate  1 tablet Oral Daily  . simvastatin  20 mg Oral QPM  . sodium bicarbonate  650 mg Oral BID  . sodium chloride  3 mL Intravenous Q12H  . tamsulosin  0.4 mg Oral Daily   Continuous Infusions:   Principal Problem:   Acute on chronic renal failure Active Problems:   Hyperkalemia   Hyponatremia    CKD (chronic kidney disease) stage 4, GFR 15-29 ml/min   Syncope   Protein-calorie malnutrition, severe    Time spent: 40 minutes   [facility]  [facility] Pager [phone]. If 8PM-8AM, please contact night-coverage at [email or web address], password [facility] 06/09/2013, 10:27 AM  LOS: 3 days

## 2013-06-09 NOTE — Progress Notes (Signed)
Patient ID: [name], male   DOB: [birth year], 78 y.o.   MRN: [id number]   [facility] Progress Note    Assessment/ Plan:   1. Acute on chronic kidney disease, stage 4: renal function better on IVFs and metabolic acidosis improved on NaHCO3. Fluids discontinued yesterday and now on oral fluid/sodium bicarbonate. We'll sign off and try and set him up for a followup appointment recognizing that he has missed follow up with Dr.Powell for renal care in the past (last seen in September 2013)---again he would not be suitable for OP dialysis due to several reason- central to which is his non-compliance.  2. Syncope: Orthostatic and appears clinically better.  3. Anemia: Hgb 7.3 on admission. After IVF Hgb down to 6.5 today. Pt also with chronic hematuria. Transfusing 1u pRBCs. Reluctant to place on ESA with metastatic CA.  4. DM2: A1c 6.8 on 03/21/13. On Januvia at home which was held on admission. CBGs well controlled.  5. HTN: On Toprol-XL, Imdur, Losartan and Lasix at home. Orthostatic and hypotensive on admission. Home meds held, except the metoprolol. Pressures have improved with fluids.  6. Metastatic prostate cancer: Metastases to the bone. C/o bony pain. Has indwelling catheter 2/2 urethral obstruction. On Leuprolide and Flomax in the hospital. Urology is recommending catheter change and hospice.   No further additions to ongoing management with his improving renal function-we'll sign off. Please call if we can assist further in management.  Subjective:   Denies any complaints and inquires about discharge from the hospital.    Objective:   BP 125/59  Pulse 77  Temp(Src) 97.3 F (36.3 C) (Oral)  Resp 18  Ht 5\' 7"  (1.702 m)  Wt 74.2 kg (163 lb 9.3 oz)  BMI 25.61 kg/m2  SpO2 100%  Intake/Output Summary (Last 24 hours) at 06/09/13 0931 Last data filed at 06/09/13 0606  Gross per 24 hour  Intake 1971.67 ml  Output   2725 ml  Net -753.33 ml   Weight change:    Physical Exam: Gen: Comfortably resting in bed, watching television CVS: Pulse regular in rate and rhythm, S1 and S2 normal Resp: Clear to auscultation bilaterally-no rales/rhonchi Abd: Soft, flat, nontender Ext: No lower extremity edema  Imaging: No results found.  Labs: BMET  Recent Labs Lab 06/06/13 1735  06/06/13 2317 06/07/13 0500 06/07/13 0512 06/07/13 1011 06/07/13 1612 06/08/13 0650 06/09/13 0555  NA 128*  --   --  132*  --   --   --  135 137  K 7.1*  < > 6.6* 5.5* 5.5* 5.5* 5.8* 4.5  4.5 4.0  CL 101  --   --  105  --   --   --  103 101  CO2 17*  --   --  16*  --   --   --  23 26  GLUCOSE 150*  --   --  100*  --   --   --  85 84  BUN 66*  --   --  62*  --   --   --  46* 34*  CREATININE 5.01*  --   --  4.65*  --   --   --  3.31* 2.52*  CALCIUM 9.1  --   --  9.0  --   --   --  8.9 8.9  < > = values in this interval not displayed. CBC  Recent Labs Lab 06/06/13 1735 06/07/13 0500 06/08/13 0650 06/09/13 0555  WBC 4.1 3.9* 5.3 5.4  NEUTROABS 2.6  --   --   --   HGB 7.3* 6.5* 7.7* 7.9*  HCT 21.2* 19.4* 22.4* 23.3*  MCV 77.9* 78.2 78.3 78.7  PLT 293 293 268 264   Medications:    . bicalutamide  50 mg Oral Daily  . cefTRIAXone (ROCEPHIN)  IV  1 g Intravenous Q24H  . [START ON 06/10/2013] leuprolide  7.5 mg Intramuscular Once  . metoprolol succinate  25 mg Oral Daily  . pantoprazole  80 mg Oral Daily  . senna-docusate  1 tablet Oral Daily  . simvastatin  20 mg Oral QPM  . sodium bicarbonate  650 mg Oral BID  . sodium chloride  3 mL Intravenous Q12H  . tamsulosin  0.4 mg Oral Daily   Zetta Bills, MD 06/09/2013, 9:31 AM

## 2013-06-10 DIAGNOSIS — E871 Hypo-osmolality and hyponatremia: Secondary | ICD-10-CM

## 2013-06-10 DIAGNOSIS — N179 Acute kidney failure, unspecified: Secondary | ICD-10-CM

## 2013-06-10 DIAGNOSIS — R55 Syncope and collapse: Secondary | ICD-10-CM

## 2013-06-10 DIAGNOSIS — E875 Hyperkalemia: Secondary | ICD-10-CM

## 2013-06-10 MED ORDER — SODIUM BICARBONATE 650 MG PO TABS: 650.0000 mg | ORAL_TABLET | Freq: Two times a day (BID) | ORAL | Status: AC

## 2013-06-10 MED ORDER — LEUPROLIDE ACETATE 7.5 MG IM KIT: 7.5000 mg | PACK | INTRAMUSCULAR | Status: AC

## 2013-06-10 MED ORDER — OXYCODONE-ACETAMINOPHEN 5-325 MG PO TABS: 1.0000 | ORAL_TABLET | ORAL | Status: DC | PRN

## 2013-06-10 MED ORDER — FUROSEMIDE 40 MG PO TABS: 20.0000 mg | ORAL_TABLET | Freq: Every day | ORAL | Status: AC

## 2013-06-10 NOTE — Progress Notes (Addendum)
Addendum: follow up call placed to patient's daughter at 2:10 pm and was able to reach and speak with her directly- [name] informed Clinical research associate that patient was to discharge to [facility] today- approximately 3-3:30; PMT was attempting to schedule GOC meeting however, as patient is discharging -discussed with daughter request for Palliative Care Services follow-up for GOC discussion, once he is at the facility and she was agreeable to this - informed daughter that Clinical research associate will contact CSW Erie Noe to request this be added to [facility] - daughter [name] aware PCS follow-up can be requested once [name] is at the facility; discussed she could also follow-up on this request from facility providers PMT will sign off at this time  Valente David, RN [phone]    Palliative Medicine Team consult for goals of care received; spoke with patient at bedside to ask his agreement with contacting his daughter- he stated "I don't care what you do, you can get out of the room and leave me alone"; call placed to daughter [name] [phone] a voice message was left -will await call back to schedule PMT meeting   Valente David, RN 06/10/2013, 11:04 AM Palliative Medicine Team RN Liaison [phone]

## 2013-06-10 NOTE — Progress Notes (Signed)
Pt refuses AM lab draw. [name]

## 2013-06-10 NOTE — Progress Notes (Signed)
06/10/2013 Patient refuse lupron depot. Dr Susie Cassette is aware. [facility] RN.

## 2013-06-10 NOTE — Discharge Summary (Signed)
Physician Discharge Summary  Garhett Bernhard MRN: [id number] DOB/AGE: 1935/04/17 78 y.o.  PCP: Leo Grosser, MD   Admit date: 06/06/2013 Discharge date: 06/10/2013  Discharge Diagnoses:  Metastatic prostrate cancer   Acute on chronic renal failure Active Problems:   Hyperkalemia   Hyponatremia   CKD (chronic kidney disease) stage 4, GFR 15-29 ml/min   Syncope   Protein-calorie malnutrition, severe     Medication List    STOP taking these medications       isosorbide mononitrate 60 MG 24 hr tablet  Commonly known as:  IMDUR     losartan 50 MG tablet  Commonly known as:  COZAAR     sulfamethoxazole-trimethoprim 800-160 MG per tablet  Commonly known as:  BACTRIM DS      TAKE these medications       acetaminophen 500 MG tablet  Commonly known as:  TYLENOL  Take 500 mg by mouth 2 (two) times daily.     allopurinol 100 MG tablet  Commonly known as:  ZYLOPRIM  Take 200 mg by mouth daily.     bicalutamide 50 MG tablet  Commonly known as:  CASODEX  Take 50 mg by mouth daily.     furosemide 40 MG tablet  Commonly known as:  LASIX  Take 0.5 tablets (20 mg total) by mouth daily.     leuprolide 7.5 MG injection  Commonly known as:  LUPRON  Inject 7.5 mg into the muscle every 28 (twenty-eight) days.     metoprolol succinate 25 MG 24 hr tablet  Commonly known as:  TOPROL-XL  Take 25 mg by mouth daily.     nitroGLYCERIN 0.4 MG SL tablet  Commonly known as:  NITROSTAT  Place 0.4 mg under the tongue every 5 (five) minutes as needed for chest pain.     omeprazole 40 MG capsule  Commonly known as:  PRILOSEC  Take 40 mg by mouth daily.     oxyCODONE-acetaminophen 5-325 MG per tablet  Commonly known as:  PERCOCET/ROXICET  Take 1 tablet by mouth every 4 (four) hours as needed for pain.     sennosides-docusate sodium 8.6-50 MG tablet  Commonly known as:  SENOKOT-S  Take 1 tablet by mouth daily.     simvastatin 20 MG tablet  Commonly known as:  ZOCOR   Take 20 mg by mouth every evening.     sitaGLIPtin 25 MG tablet  Commonly known as:  JANUVIA  Take 1 tablet (25 mg total) by mouth daily with breakfast.     sodium bicarbonate 650 MG tablet  Take 1 tablet (650 mg total) by mouth 2 (two) times daily.     tamsulosin 0.4 MG Caps  Commonly known as:  FLOMAX  Take 0.4 mg by mouth daily.        Discharge Condition: Stable  Disposition: 03-Skilled Nursing Facility   Consults:    Significant Diagnostic Studies: Ct Abdomen Pelvis Wo Contrast  05/22/2013   *RADIOLOGY REPORT*  Clinical Data: Abdominal pain, left lower quadrant pain, history prostate cancer with osseous metastases, chronic kidney disease, hypertension, coronary artery disease post MI, asthma, CHF, diabetes  CT ABDOMEN AND PELVIS WITHOUT CONTRAST  Technique:  Multidetector CT imaging of the abdomen and pelvis was performed following the standard protocol without intravenous contrast. Patient drank dilute oral contrast for study.  Sagittal and coronal MPR images reconstructed from axial data set.  Comparison: 01/1991 1014  Findings: Atelectasis at lung bases. Scattered peribronchial thickening. Focal area of opacity at the  medial right lower lobe may represent persistent infiltrate though mass is not excluded, area measuring 14 x 11 mm image 7. Tiny nonobstructing calculus mid right kidney image 23. Bilateral hydronephrosis and hydroureter. Marked abnormal soft tissue is identified at the prostate bed bladder base, question prostatic enlargement though cannot exclude prostate cancer or bladder cancer, area overall measuring 8.9 x 8.1 x 5.6 cm. Foley catheter within urinary bladder. Remaining bladder wall appears thickened.  Within limits of a nonenhanced exam no focal abnormalities of the liver, spleen, pancreas or adrenal glands. Normal appendix. Stomach and bowel loops grossly unremarkable. Scattered atherosclerotic calcifications without aortic aneurysm. No mass, free fluid, or free  air. Slightly prominent inguinal canals containing fat. Enlarged left periaortic lymph node 2.0 x 1.5 cm image 26. Numerous sclerotic foci are identified throughout the lumbar spine, pelvis and proximal femora consistent with widespread osseous metastatic disease.  IMPRESSION: Bilateral hydronephrosis secondary to marked enlargement of the prostate gland, cannot exclude prostate cancer or cancer arising from the base of the urinary bladder. Slight increase in size of a left periaortic lymph node. Question focus of atelectasis or scarring at medial right lower lobe though mass/nodule not excluded 14 x 11 mm, recommend attention on follow-up exams. Extensive osseous metastatic disease.   Original Report Authenticated By: Ulyses Southward, M.D.   Dg Chest 1 View  06/06/2013   *RADIOLOGY REPORT*  Clinical Data: Loss of consciousness.  CHEST - 1 VIEW  Comparison: Abdominal CT 05/22/2013.  Chest radiographs 05/22/2013.  Findings: 1720 hours.  The heart size and mediastinal contours are stable.  The lungs appear clear.  There is no pleural effusion or pneumothorax.  Diffuse blastic metastases are again demonstrated throughout the visualized skeleton.  Telemetry leads overlie the chest.  IMPRESSION: Widespread blastic metastatic disease.  No acute cardiopulmonary process identified.   Original Report Authenticated By: Carey Bullocks, M.D.   Dg Chest 2 View  05/22/2013   *RADIOLOGY REPORT*  Clinical Data: Abdominal pain.  CHEST - 2 VIEW  Comparison: 03/07/2013.  Findings: Trachea is midline.  Heart size normal.  Probable mild bibasilar scarring.  Lungs are otherwise clear.  No pleural fluid.  IMPRESSION: Probable bibasilar scarring.   Original Report Authenticated By: Leanna Battles, M.D.   Dg Hip Complete Left  06/06/2013   *RADIOLOGY REPORT*  Clinical Data: Left hip pain.  Loss of consciousness.  History of metastatic prostate cancer.  LEFT HIP - COMPLETE 2+ VIEW  Comparison: Pelvic CT 05/22/2013.  Findings: Blastic  metastases throughout the pelvis and proximal femurs are again noted.  There is no evidence of pathologic fracture or dislocation.  Mild degenerative changes of both hips and sacroiliac joints are noted.  There are scattered vascular calcifications.  IMPRESSION: No acute osseous findings.  Widespread blastic metastatic disease.   Original Report Authenticated By: Carey Bullocks, M.D.   Ct Head Wo Contrast  06/06/2013   *RADIOLOGY REPORT*  Clinical Data: Dizziness.  Fall.  Blunt trauma to posterior head. Loss of consciousness.  CT HEAD WITHOUT CONTRAST  Technique:  Contiguous axial images were obtained from the base of the skull through the vertex without contrast.  Comparison: 04/07/2013  Findings: There is no evidence of intracranial hemorrhage, brain edema or other signs of acute infarction.  There is no evidence of intracranial mass lesion or mass effect.  No abnormal extra-axial fluid collections are identified.  Moderate diffuse cerebral atrophy is stable in appearance. Moderate to severe chronic small vessel disease is also unchanged in appearance.  Ventricles are stable  in size.  No abnormal extra- axial fluid collections are seen.  No evidence of skull fracture or pneumocephalus.  IMPRESSION:  1.  No acute intracranial findings. 2.  Stable diffuse cerebral atrophy and chronic small vessel disease.   Original Report Authenticated By: Myles Rosenthal, M.D.   US Renal  06/06/2013   *RADIOLOGY REPORT*  Clinical Data: Acute renal insufficiency.  Hematuria.  RENAL/URINARY TRACT ULTRASOUND COMPLETE  Comparison:  CT 05/22/2013  Findings:  Right Kidney:  8.5 cm.  11 mm cyst in the mid pole.  Mild right hydronephrosis, decreased since prior CT.  Left Kidney:  10.0 cm.  No focal abnormality.  Normal echotexture. Mild to moderate left hydronephrosis, likely slightly improved since prior study.  Bladder:  Foley catheter in place, decompressed.  Soft tissue mass- like area noted adjacent to the decompressed bladder which  presumably represents the large prostate seen on prior CT.  Cannot completely exclude bladder mass.  IMPRESSION: Bilateral hydronephrosis, improved since prior CT.  Bladder decompressed with Foley catheter in place.  Large soft tissue mass adjacent to the bladder presumably represents enlarged prostate.   Original Report Authenticated By: Charlett Nose, M.D.      Microbiology: No results found for this or any previous visit (from the past 240 hour(s)).   Labs: Results for orders placed during the hospital encounter of 06/06/13 (from the past 48 hour(s))  CBC     Status: Abnormal   Collection Time    06/09/13  5:55 AM      Result Value Range   WBC 5.4  4.0 - 10.5 K/uL   RBC 2.96 (*) 4.22 - 5.81 MIL/uL   Hemoglobin 7.9 (*) 13.0 - 17.0 g/dL   HCT 16.1 (*) 09.6 - 04.5 %   MCV 78.7  78.0 - 100.0 fL   MCH 26.7  26.0 - 34.0 pg   MCHC 33.9  30.0 - 36.0 g/dL   RDW 40.9 (*) 81.1 - 91.4 %   Platelets 264  150 - 400 K/uL  BASIC METABOLIC PANEL     Status: Abnormal   Collection Time    06/09/13  5:55 AM      Result Value Range   Sodium 137  135 - 145 mEq/L   Potassium 4.0  3.5 - 5.1 mEq/L   Chloride 101  96 - 112 mEq/L   CO2 26  19 - 32 mEq/L   Glucose, Bld 84  70 - 99 mg/dL   BUN 34 (*) 6 - 23 mg/dL   Creatinine, Ser 7.82 (*) 0.50 - 1.35 mg/dL   Calcium 8.9  8.4 - 95.6 mg/dL   GFR calc non Af Amer 23 (*) >90 mL/min   GFR calc Af Amer 27 (*) >90 mL/min   Comment:            The eGFR has been calculated     using the CKD EPI equation.     This calculation has not been     validated in all clinical     situations.     eGFR's persistently     <90 mL/min signify     possible Chronic Kidney Disease.     HPI :[name] is a 78 y.o. male who presents to the ED after an episode of Syncope at his facility that occurred while walking to restroom. Brief LOC, no injury, initial SBP on scene 80s and patient AAOx4 at that time per EMS. Refused IV per EMS. Patient placed in trendelenburg with  BP rising  to 120s. Has indwelling cath and h/o prostate CA, h/o CKD stage 4 with baseline creat of 2.5-2.9.  In ED patient found to be in AKI on CKD, creat 5.01, severely hyperkalemic with potassium of 7.1. Sodium 128 and orthostatic hypotension noted. Bladder scan showed only 80 cc of urine in the bladder and his chronic foley is in place and draining. Initially patient very resistant to letting ED put in an IV for treatment but finally consented after we stressed that this could be fatal if untreated.    HOSPITAL COURSE:  Acute on chronic kidney disease stage IV  Secondary to postobstructive uropathy, creatinine has improved, 2.52 prior to discharge Nephrology following, kidney function improving with IV hydration  Renal ultrasound shows bilateral hydronephrosis  Likely post obstructive  Patient does have some degree of metabolic acidosis with a low bicarbonate  Now on oral bicarbonate pain with urination with his cath Foley catheter which is chronic . He states he has had intermittent hematuria for a while and he  was to followup with Dr. Lynnae Sandhoff  Urology was consulted, seen by Dr. Isabel Caprice , urology recommend hospice evaluation, they cannot help regarding hematuria, he's not a candidate for cauterization of bladder prostate cancer  Started on Lupron, Not a Hemodialysis candidate per  nephrology Renal to sign off follow up with Dr.Powell for renal care  Adjusted antihypertensive regimen Decrease Lasix and discontinued imdur/lorsartan   Diabetes mellitus  A1c of 6.8  CBg well controlled   Prostrate cancer-metastatic to the bone  On Lupron and Flomax   Hyperkalemia  Received Kayexalate  Now improved   Hyponatremia secondary to dehydration improving, likely secondary to acute renal failure   Anemia baseline of 8-9 have some hematuria after placement of Foley, hg down to 6.5  Status post transfusion of one unit of packed red blood cells  Monitor CBC        Discharge  Exam:  Blood pressure 106/56, pulse 72, temperature 98.5 F (36.9 C), temperature source Oral, resp. rate 19, height 5\' 7"  (1.702 m), weight 74.4 kg (164 lb 0.4 oz), SpO2 100.00%.   Cardiovascular: Normal rate, regular rhythm, normal heart sounds and intact distal pulses.  Pulmonary/Chest: Effort normal and breath sounds normal. No respiratory distress.  Abdominal: Soft. Normal appearance and bowel sounds are normal. She exhibits no distension. There is no tenderness.  Musculoskeletal: She exhibits no edema and no tenderness.  Neurological: She is alert. No cranial nerve deficit.         SignedRicharda Overlie 06/10/2013, 10:38 AM

## 2013-06-10 NOTE — Progress Notes (Signed)
Pt noted to have saturated through secondary reinforcement. Pressure reapplied for 20 Mins and reinforced. Will continue to monitor. [name]

## 2013-06-10 NOTE — Clinical Social Work Placement (Signed)
Clinical Social Work Department CLINICAL SOCIAL WORK PLACEMENT NOTE 06/10/2013  Patient:  [name], [name]  Account Number:  [id number] Admit date:  06/06/2013  Clinical Social Worker:  Genelle Bal, LCSW  Date/time:  06/10/2013 03:30 AM  Clinical Social Work is seeking post-discharge placement for this patient at the following level of care:   SKILLED NURSING   (*CSW will update this form in Epic as items are completed)   06/07/2013  Patient/family provided with [facility] Department of Clinical Social Work's list of facilities offering this level of care within the geographic area requested by the patient (or if unable, by the patient's family).  06/07/2013  Patient/family informed of their freedom to choose among providers that offer the needed level of care, that participate in Medicare, Medicaid or managed care program needed by the patient, have an available bed and are willing to accept the patient.    Patient/family informed of [facility]' ownership interest in [facility], as well as of the fact that they are under no obligation to receive care at this facility.  PASARR submitted to [employer] on 02/11/2013 PASARR number received from [employer] on 02/11/2013  FL2 transmitted to all facilities in geographic area requested by pt/family on  06/07/2013 FL2 transmitted to all facilities within larger geographic area on   Patient informed that his/her managed care company has contracts with or will negotiate with  certain facilities, including the following:     Patient/family informed of bed offers received:  06/10/2013 Patient chooses bed at [facility] Physician recommends and patient chooses bed at    Patient to be transferred to [facility] on  06/10/2013 Patient to be transferred to facility by ambulance  The following physician request were entered in Epic:   Additional Comments: 06/10/13 - Added note on FL-2 requesting Palliative  Services at SNF per MD request for Palliative meeting with patient and daughter today.

## 2013-07-05 ENCOUNTER — Non-Acute Institutional Stay (SKILLED_NURSING_FACILITY): Payer: Medicare Other | Admitting: Adult Health

## 2013-07-05 ENCOUNTER — Encounter: Payer: Self-pay | Admitting: Adult Health

## 2013-07-05 DIAGNOSIS — C800 Disseminated malignant neoplasm, unspecified: Secondary | ICD-10-CM

## 2013-07-05 DIAGNOSIS — I509 Heart failure, unspecified: Secondary | ICD-10-CM

## 2013-07-05 DIAGNOSIS — I5022 Chronic systolic (congestive) heart failure: Secondary | ICD-10-CM

## 2013-07-05 DIAGNOSIS — K5900 Constipation, unspecified: Secondary | ICD-10-CM | POA: Insufficient documentation

## 2013-07-05 DIAGNOSIS — E119 Type 2 diabetes mellitus without complications: Secondary | ICD-10-CM

## 2013-07-05 DIAGNOSIS — M109 Gout, unspecified: Secondary | ICD-10-CM

## 2013-07-05 DIAGNOSIS — N184 Chronic kidney disease, stage 4 (severe): Secondary | ICD-10-CM

## 2013-07-05 DIAGNOSIS — C61 Malignant neoplasm of prostate: Secondary | ICD-10-CM

## 2013-07-05 DIAGNOSIS — I10 Essential (primary) hypertension: Secondary | ICD-10-CM

## 2013-07-05 DIAGNOSIS — K219 Gastro-esophageal reflux disease without esophagitis: Secondary | ICD-10-CM

## 2013-07-05 NOTE — Assessment & Plan Note (Signed)
He is presently stable will continue januvia 25 mg daily and will monitor

## 2013-07-05 NOTE — Assessment & Plan Note (Signed)
His renal function remains unchanged; will continue sodium bicarb 650 mg twice daily and will monitor

## 2013-07-05 NOTE — Assessment & Plan Note (Signed)
Will continue toprol xl 25 mg daily and will monitor

## 2013-07-05 NOTE — Progress Notes (Signed)
Patient ID: [name], male   DOB: 1935/05/03, 78 y.o.   MRN: [id number]  [place]  Allergies  Allergen Reactions  . Aspirin Other (See Comments)    "makes my stomach hurt real bad."  . Feldene (Piroxicam) Swelling     Chief Complaint  Patient presents with  . Hospitalization Follow-up    HPI:  He has had multiple hospitalizations for chf; dementia; fluid volume overload. He tends to go home before he is ready due to his insistence. He is here at this time for short term rehab. I am not certain what the long term goals are for him; which should be long term placement at this time.    Past Medical History  Diagnosis Date  . CAD (coronary artery disease)   . Hypertension   . Obesity   . Dyslipidemia   . Anemia   . H/O hemorrhoids   . H/O: GI bleed   . Rhinitis, allergic   . Fatty liver   . Shoulder dislocation 1970    right  . Myocardial infarction 2007  . History of gout   . Asthma   . Arthritis     "all over my whole body" (03/07/2013)  . Chronic kidney disease (CKD), stage III (moderate)     ckd III  . Carpal tunnel syndrome   . Prostate cancer 02/11/13-02/20/13    metastatic prostate cancer  . Bone metastasis   . Fall at home 04/07/2013  . CHF (congestive heart failure)   . Type II diabetes mellitus   . Foley catheter in place     "Coude" (04/08/2013)    Past Surgical History  Procedure Laterality Date  . Transurethral resection of prostate  06/08/2011, 11/08/06  . Nerve repair Right 1974    "hand" (03/07/2013)  . Cataract extraction w/ intraocular lens  implant, bilateral Bilateral     VITAL SIGNS BP 114/50  Pulse 70  Ht 5\' 7"  (1.702 m)  Wt 160 lb (72.576 kg)  BMI 25.05 kg/m2   Patient's Medications  New Prescriptions   No medications on file  Previous Medications   ACETAMINOPHEN (TYLENOL) 500 MG TABLET    Take 500 mg by mouth 2 (two) times daily.   ALLOPURINOL (ZYLOPRIM) 100 MG TABLET    Take 200 mg by mouth daily.   BICALUTAMIDE (CASODEX) 50  MG TABLET    Take 50 mg by mouth daily.    FUROSEMIDE (LASIX) 40 MG TABLET    Take 0.5 tablets (20 mg total) by mouth daily.   LEUPROLIDE (LUPRON) 7.5 MG INJECTION    Inject 7.5 mg into the muscle every 28 (twenty-eight) days.   METOPROLOL SUCCINATE (TOPROL-XL) 25 MG 24 HR TABLET    Take 25 mg by mouth daily.   NITROGLYCERIN (NITROSTAT) 0.4 MG SL TABLET    Place 0.4 mg under the tongue every 5 (five) minutes as needed for chest pain.    OMEPRAZOLE (PRILOSEC) 40 MG CAPSULE    Take 20 mg by mouth daily.    SENNOSIDES-DOCUSATE SODIUM (SENOKOT-S) 8.6-50 MG TABLET    Take 1 tablet by mouth daily.   SITAGLIPTIN (JANUVIA) 25 MG TABLET    Take 1 tablet (25 mg total) by mouth daily with breakfast.   SODIUM BICARBONATE 650 MG TABLET    Take 1 tablet (650 mg total) by mouth 2 (two) times daily.   TAMSULOSIN HCL (FLOMAX) 0.4 MG CAPS    Take 0.4 mg by mouth daily.  Modified Medications   No medications on file  Discontinued Medications   OXYCODONE-ACETAMINOPHEN (PERCOCET/ROXICET) 5-325 MG PER TABLET    Take 1 tablet by mouth every 4 (four) hours as needed for pain.   SIMVASTATIN (ZOCOR) 20 MG TABLET    Take 20 mg by mouth every evening.    SIGNIFICANT DIAGNOSTIC EXAMS    LABS REVIEWED:   03-21-13: hgb a1c 6.8 06-07-13: wbc 3.9; hgb 6.5; hct 19.4; mcv 78.2; plt 293; osmolality 293; urine osmolality: 358 06-08-13: wbc 5.3; hgb 7.7; hct 22.4; mcv 78.3; plt 268; glucose 85; bun 46; creat 3.31; k+4.5; na++ 135 Alk phos 687; albumin 2.7 06-09-13: wbc 5.4; hgb 7.9; hct 23.3; mcv 78.7; plt 264; glucose 84; bun 34; creat 2.52; k+4.4; na++137   Review of Systems  Constitutional: Negative for malaise/fatigue.  Eyes: Negative for blurred vision.  Respiratory: Negative for cough and shortness of breath.   Cardiovascular: Negative for chest pain and palpitations.  Gastrointestinal: Negative for heartburn, abdominal pain and constipation.  Musculoskeletal: Negative for back pain and myalgias.  Skin: Negative.    Neurological: Negative for headaches.  Psychiatric/Behavioral: Negative for depression. The patient does not have insomnia.     Physical Exam  Constitutional: He appears well-developed. No distress.  Neck: Neck supple. No JVD present.  Cardiovascular: Normal rate, regular rhythm and intact distal pulses.   Respiratory: Effort normal and breath sounds normal. No respiratory distress. He has no wheezes.  GI: Soft. Bowel sounds are normal. He exhibits no distension. There is no tenderness.  Musculoskeletal: Normal range of motion. He exhibits no edema.  Neurological: He is alert.  Skin: Skin is warm and dry. He is not diaphoretic.       ASSESSMENT/ PLAN:  Chronic systolic CHF (congestive heart failure) His status remains unchanged; will continue lasix 20 mg daily; ntg prn for chest pain and will monitor his status  Essential hypertension, benign Will continue toprol xl 25 mg daily and will monitor  Prostate cancer metastatic to bone His status is unchanged will continue casodex 50 mg daily and will continue lupron injections on a monthly basis and will setup a palliative care consult for him. Will monitor his status   GERD (gastroesophageal reflux disease) Will continue prilosec 20 mg daily  Diabetes mellitus He is presently stable will continue januvia 25 mg daily and will monitor  CKD (chronic kidney disease) stage 4, GFR 15-29 ml/min His renal function remains unchanged; will continue sodium bicarb 650 mg twice daily and will monitor   Gout He is stable will continue allopurinol 200 mg daily   Constipation Will continue senna daily    Time spent with patient 50 minutes

## 2013-07-05 NOTE — Assessment & Plan Note (Signed)
Will continue prilosec 20 mg daily  

## 2013-07-05 NOTE — Assessment & Plan Note (Signed)
He is stable will continue allopurinol 200 mg daily

## 2013-07-05 NOTE — Assessment & Plan Note (Signed)
His status remains unchanged; will continue lasix 20 mg daily; ntg prn for chest pain and will monitor his status

## 2013-07-05 NOTE — Assessment & Plan Note (Signed)
Will continue senna daily

## 2013-07-05 NOTE — Assessment & Plan Note (Signed)
His status is unchanged will continue casodex 50 mg daily and will continue lupron injections on a monthly basis and will setup a palliative care consult for him. Will monitor his status

## 2013-07-10 ENCOUNTER — Non-Acute Institutional Stay (SKILLED_NURSING_FACILITY): Payer: Medicare Other | Admitting: Internal Medicine

## 2013-07-10 DIAGNOSIS — I509 Heart failure, unspecified: Secondary | ICD-10-CM

## 2013-07-10 DIAGNOSIS — C800 Disseminated malignant neoplasm, unspecified: Secondary | ICD-10-CM

## 2013-07-10 DIAGNOSIS — E119 Type 2 diabetes mellitus without complications: Secondary | ICD-10-CM

## 2013-07-10 DIAGNOSIS — M109 Gout, unspecified: Secondary | ICD-10-CM

## 2013-07-10 DIAGNOSIS — C61 Malignant neoplasm of prostate: Secondary | ICD-10-CM

## 2013-07-10 DIAGNOSIS — N184 Chronic kidney disease, stage 4 (severe): Secondary | ICD-10-CM

## 2013-07-10 DIAGNOSIS — I10 Essential (primary) hypertension: Secondary | ICD-10-CM

## 2013-07-10 DIAGNOSIS — I5022 Chronic systolic (congestive) heart failure: Secondary | ICD-10-CM

## 2013-07-10 NOTE — Progress Notes (Signed)
Patient ID: [name], male   DOB: 1935/10/18, 78 y.o.   MRN: [id number] Provider:  Gwenith Spitz. Renato Gails, D.O., C.M.D. Location:  Location:  [facility]   PCP: Leo Grosser, MD  Code Status: DNR, palliative   Allergies  Allergen Reactions  . Aspirin Other (See Comments)    "makes my stomach hurt real bad."  . Feldene [Piroxicam] Swelling    Chief Complaint  Patient presents with  . Medical Managment of Chronic Issues    new admission to [facility]    HPI: 78 y.o. male with h/o CHF with recent difficulties with volume overload, vascular dementia, metastatic prostate cancer to bones with severe pain that has been difficult to control and has caused hematuria was admitted here for continued care.  He has been in most of the facilities in the area for short periods of time, then insists upon going home and returns to the hospital or another SNF within a couple of days b/c he is unable to care for himself.  When seen today, he denied pain and requested a nice glass of ice water.  He has been on fluid restriction due to his CHF.    ROS: Review of Systems  Constitutional: Positive for weight loss and malaise/fatigue. Negative for fever and chills.  HENT: Negative for congestion.   Respiratory: Negative for cough and shortness of breath.   Cardiovascular: Negative for chest pain.  Gastrointestinal: Negative for abdominal pain and constipation.  Genitourinary: Positive for hematuria. Negative for flank pain.  Musculoskeletal: Negative for back pain, joint pain and falls.  Skin: Negative for rash.  Neurological: Positive for weakness. Negative for dizziness.  Endo/Heme/Allergies: Bruises/bleeds easily.  Psychiatric/Behavioral: Positive for depression and memory loss.     Past Medical History  Diagnosis Date  . CAD (coronary artery disease)   . Hypertension   . Obesity   . Dyslipidemia   . Anemia   . H/O hemorrhoids   . H/O: GI bleed   . Rhinitis, allergic    . Fatty liver   . Shoulder dislocation 1970    right  . Myocardial infarction 2007  . History of gout   . Asthma   . Arthritis     "all over my whole body" (03/07/2013)  . Chronic kidney disease (CKD), stage III (moderate)     ckd III  . Carpal tunnel syndrome   . Prostate cancer 02/11/13-02/20/13    metastatic prostate cancer  . Bone metastasis   . Fall at home 04/07/2013  . CHF (congestive heart failure)   . Type II diabetes mellitus   . Foley catheter in place     "Coude" (04/08/2013)   Past Surgical History  Procedure Laterality Date  . Transurethral resection of prostate  06/08/2011, 11/08/06  . Nerve repair Right 1974    "hand" (03/07/2013)  . Cataract extraction w/ intraocular lens  implant, bilateral Bilateral    Social History:   reports that he has quit smoking. He has never used smokeless tobacco. He reports that he does not drink alcohol or use illicit drugs.  Family History  Problem Relation Age of Onset  . Diabetes Mother     Medications: Patient's Medications  New Prescriptions   No medications on file  Previous Medications   ACETAMINOPHEN (TYLENOL) 500 MG TABLET    Take 500 mg by mouth 2 (two) times daily.   ALLOPURINOL (ZYLOPRIM) 100 MG TABLET    Take 200 mg by mouth daily.   BICALUTAMIDE (CASODEX) 50 MG  TABLET    Take 50 mg by mouth daily.    FUROSEMIDE (LASIX) 40 MG TABLET    Take 0.5 tablets (20 mg total) by mouth daily.   LEUPROLIDE (LUPRON) 7.5 MG INJECTION    Inject 7.5 mg into the muscle every 28 (twenty-eight) days.   METOPROLOL SUCCINATE (TOPROL-XL) 25 MG 24 HR TABLET    Take 25 mg by mouth daily.   NITROGLYCERIN (NITROSTAT) 0.4 MG SL TABLET    Place 0.4 mg under the tongue every 5 (five) minutes as needed for chest pain.    OMEPRAZOLE (PRILOSEC) 40 MG CAPSULE    Take 20 mg by mouth daily.    SENNOSIDES-DOCUSATE SODIUM (SENOKOT-S) 8.6-50 MG TABLET    Take 1 tablet by mouth daily.   SITAGLIPTIN (JANUVIA) 25 MG TABLET    Take 1 tablet (25 mg total)  by mouth daily with breakfast.   SODIUM BICARBONATE 650 MG TABLET    Take 1 tablet (650 mg total) by mouth 2 (two) times daily.   TAMSULOSIN HCL (FLOMAX) 0.4 MG CAPS    Take 0.4 mg by mouth daily.  Modified Medications   No medications on file  Discontinued Medications   No medications on file     Physical Exam: There were no vitals filed for this visit. Physical Exam  Constitutional:  Thin black male, NAD at this time  HENT:  Head: Normocephalic and atraumatic.  Right Ear: External ear normal.  Left Ear: External ear normal.  Nose: Nose normal.  Mouth/Throat: Oropharynx is clear and moist.  Eyes: Conjunctivae and EOM are normal. Pupils are equal, round, and reactive to light.  Neck: Normal range of motion. Neck supple. No JVD present.  Cardiovascular: Normal rate, regular rhythm, normal heart sounds and intact distal pulses.   Pulmonary/Chest: Effort normal and breath sounds normal. No respiratory distress.  Abdominal: Soft. Bowel sounds are normal. He exhibits no distension. There is no tenderness.  Genitourinary:  Foley in place draining bright red urine  Musculoskeletal: Normal range of motion. He exhibits tenderness.  Over low back on palpation  Lymphadenopathy:    He has no cervical adenopathy.  Neurological: He is alert. No cranial nerve deficit. He exhibits normal muscle tone. Coordination normal.  Oriented to person and place, recognizes me this time  Skin: Skin is warm and dry.     Labs reviewed: Basic Metabolic Panel:  Recent Labs  96/04/54 0450 02/09/13 0625  02/12/13 0530  06/07/13 0500  06/07/13 1612 06/08/13 0650 06/09/13 0555  NA 139 139  < > 138  < > 132*  --   --  135 137  K 3.7 3.6  < > 3.4*  < > 5.5*  < > 5.8* 4.5  4.5 4.0  CL 98 99  < > 94*  < > 105  --   --  103 101  CO2 32 32  < > 37*  < > 16*  --   --  23 26  GLUCOSE 185* 122*  < > 160*  < > 100*  --   --  85 84  BUN 45* 35*  < > 29*  < > 62*  --   --  46* 34*  CREATININE 4.77* 4.09*  <  > 3.91*  < > 4.65*  --   --  3.31* 2.52*  CALCIUM 7.8* 7.9*  < > 8.2*  < > 9.0  --   --  8.9 8.9  MG 1.3*  --   --  1.4*  --   --   --   --   --   --  PHOS 3.3 3.2  --   --   --   --   --   --   --   --   < > = values in this interval not displayed. Liver Function Tests:  Recent Labs  04/07/13 1339 05/22/13 1225 06/08/13 0650  AST 18 25 26   ALT 10 24 18   ALKPHOS 283* 717* 687*  BILITOT 0.5 0.1* 0.3  PROT 6.0 7.0 5.8*  ALBUMIN 2.6* 3.0* 2.7*    Recent Labs  05/22/13 1225  LIPASE 65*  CBC:  Recent Labs  04/07/13 1339  05/22/13 1225  06/06/13 1735 06/07/13 0500 06/08/13 0650 06/09/13 0555  WBC 4.7  < > 5.5  < > 4.1 3.9* 5.3 5.4  NEUTROABS 3.1  --  3.4  --  2.6  --   --   --   HGB 9.0*  < > 8.8*  < > 7.3* 6.5* 7.7* 7.9*  HCT 26.7*  < > 26.3*  < > 21.2* 19.4* 22.4* 23.3*  MCV 76.9*  < > 77.8*  < > 77.9* 78.2 78.3 78.7  PLT 402*  < > 369  < > 293 293 268 264  < > = values in this interval not displayed. Cardiac Enzymes:  Recent Labs  05/22/13 2033 06/07/13 0535 06/07/13 1600  TROPONINI <0.30 <0.30 <0.30  CBG:  Recent Labs  05/24/13 0606 05/24/13 1057 05/24/13 1603  GLUCAP 91 182* 163*   Assessment/Plan 1. Prostate cancer metastatic to multiple sites Continue on casodex, flomax and percocet for bone mets pain  Palliative care consult with long term plan for hospice care for him  2. Chronic systolic CHF (congestive heart failure) Monitor fluid intake, edema, respiratory status--is stable at this time w/o any concerning signs of volume overload Continue diuretic, metoprolol, no longer on ASA due to persistent hematuria (allergy has been entered)  3. Essential hypertension, benign At goal with current therapy  4. CKD (chronic kidney disease) stage 4, GFR 15-29 ml/min Avoid NSAIDs, is on diuretics, and also has CHF with fluid restriction--as we discuss his goals more fully, we may be able to eliminate the fluid restriction in favor of comfort (keeping him  from feeling thirsty) when he is able to receive hospice care  5. Diabetes mellitus Continue low dose januvia--intake is not very good, so monitor carefully for hypoglycemia as this medication works on postprandial glucose  6. Gout Continue allopurinol No signs of acute flare  Functional status:  Dependent in bathing, dressing, grooming, can transfer and feeds self  Family/ staff Communication: discussed his condition with his nurse today  Labs/tests ordered:  Palliative care consult while he remains on medicare for rehab

## 2013-07-15 ENCOUNTER — Other Ambulatory Visit: Payer: Self-pay | Admitting: Geriatric Medicine

## 2013-07-15 MED ORDER — OXYCODONE-ACETAMINOPHEN 5-325 MG PO TABS: 2.0000 | ORAL_TABLET | Freq: Four times a day (QID) | ORAL | Status: DC | PRN

## 2013-07-17 ENCOUNTER — Non-Acute Institutional Stay (SKILLED_NURSING_FACILITY): Payer: Medicare Other | Admitting: Internal Medicine

## 2013-07-17 ENCOUNTER — Encounter: Payer: Self-pay | Admitting: Internal Medicine

## 2013-07-17 DIAGNOSIS — R531 Weakness: Secondary | ICD-10-CM

## 2013-07-17 DIAGNOSIS — C61 Malignant neoplasm of prostate: Secondary | ICD-10-CM

## 2013-07-17 DIAGNOSIS — R5383 Other fatigue: Secondary | ICD-10-CM

## 2013-07-17 DIAGNOSIS — C800 Disseminated malignant neoplasm, unspecified: Secondary | ICD-10-CM

## 2013-07-17 DIAGNOSIS — I509 Heart failure, unspecified: Secondary | ICD-10-CM

## 2013-07-17 DIAGNOSIS — R5381 Other malaise: Secondary | ICD-10-CM

## 2013-07-17 DIAGNOSIS — I5022 Chronic systolic (congestive) heart failure: Secondary | ICD-10-CM

## 2013-07-17 NOTE — Progress Notes (Signed)
Patient ID: [name], male   DOB: [birth year], 78 y.o.   MRN: [id number] Location:  Location:  [name] Provider:  Gwenith [name]. [name], D.O., C.M.D.  Code Status:  DNR  Chief Complaint  Patient presents with  . Acute Visit    follow up of pain control, edema    HPI:  78 yo male with h/o CHF, metastatic prostate cancer to the bone causing chronic pain, hematuria was seen to f/u on his pain control, edema.  He has been followed by palliative care and completes his last day of therapy today.  Hospice will begin tomorrow.    When seen, he was doing much better.  His pain control is much improved and he has no significant edema.  Bowels are moving.  His hematuria persists.    Review of Systems:  Review of Systems  Constitutional: Positive for weight loss and malaise/fatigue. Negative for fever and chills.  HENT: Negative for congestion.   Eyes: Negative for blurred vision.  Respiratory: Negative for sputum production.   Cardiovascular: Negative for chest pain and leg swelling.  Genitourinary: Positive for hematuria. Negative for dysuria.  Musculoskeletal: Positive for myalgias, back pain and joint pain. Negative for falls.       Controlled at present  Skin: Negative for rash.  Neurological: Positive for weakness. Negative for loss of consciousness and headaches.  Endo/Heme/Allergies: Bruises/bleeds easily.  Psychiatric/Behavioral: Positive for depression and memory loss. The patient does not have insomnia.     Medications: Patient's Medications  New Prescriptions   No medications on file  Previous Medications   ACETAMINOPHEN (TYLENOL) 500 MG TABLET    Take 500 mg by mouth 2 (two) times daily.   ALLOPURINOL (ZYLOPRIM) 100 MG TABLET    Take 200 mg by mouth daily.   BICALUTAMIDE (CASODEX) 50 MG TABLET    Take 50 mg by mouth daily.    FUROSEMIDE (LASIX) 40 MG TABLET    Take 0.5 tablets (20 mg total) by mouth daily.   LEUPROLIDE (LUPRON) 7.5 MG INJECTION    Inject  7.5 mg into the muscle every 28 (twenty-eight) days.   METOPROLOL SUCCINATE (TOPROL-XL) 25 MG 24 HR TABLET    Take 25 mg by mouth daily.   NITROGLYCERIN (NITROSTAT) 0.4 MG SL TABLET    Place 0.4 mg under the tongue every 5 (five) minutes as needed for chest pain.    OMEPRAZOLE (PRILOSEC) 40 MG CAPSULE    Take 20 mg by mouth daily.    OXYCODONE-ACETAMINOPHEN (PERCOCET/ROXICET) 5-325 MG PER TABLET    Take 2 tablets by mouth every 6 (six) hours as needed for pain.   SENNOSIDES-DOCUSATE SODIUM (SENOKOT-S) 8.6-50 MG TABLET    Take 1 tablet by mouth daily.   SITAGLIPTIN (JANUVIA) 25 MG TABLET    Take 1 tablet (25 mg total) by mouth daily with breakfast.   SODIUM BICARBONATE 650 MG TABLET    Take 1 tablet (650 mg total) by mouth 2 (two) times daily.   TAMSULOSIN HCL (FLOMAX) 0.4 MG CAPS    Take 0.4 mg by mouth daily.  Modified Medications   No medications on file  Discontinued Medications   No medications on file    Physical Exam: Filed Vitals:   07/17/13 2000  BP: 130/90  Pulse: 86  Temp: 97.4 F (36.3 C)  Resp: 18  SpO2: 97%  Physical Exam  Constitutional:  Frail black male, NAD, resting in bed  HENT:  Head: Normocephalic and atraumatic.  Cardiovascular: Normal rate, regular  rhythm, normal heart sounds and intact distal pulses.   Pulmonary/Chest: Effort normal and breath sounds normal. No respiratory distress.  Abdominal: Soft. Bowel sounds are normal. He exhibits no distension. There is no tenderness.  Genitourinary:  Bright red urine in foley bag  Musculoskeletal: Normal range of motion. He exhibits no edema and no tenderness.  Neurological: He is alert.  oriented to person and place, not time  Skin: Skin is warm and dry.    Labs reviewed: Basic Metabolic Panel:  Recent Labs  40/98/11 0450 02/09/13 0625  02/12/13 0530  06/07/13 0500  06/07/13 1612 06/08/13 0650 06/09/13 0555  NA 139 139  < > 138  < > 132*  --   --  135 137  K 3.7 3.6  < > 3.4*  < > 5.5*  < > 5.8* 4.5   4.5 4.0  CL 98 99  < > 94*  < > 105  --   --  103 101  CO2 32 32  < > 37*  < > 16*  --   --  23 26  GLUCOSE 185* 122*  < > 160*  < > 100*  --   --  85 84  BUN 45* 35*  < > 29*  < > 62*  --   --  46* 34*  CREATININE 4.77* 4.09*  < > 3.91*  < > 4.65*  --   --  3.31* 2.52*  CALCIUM 7.8* 7.9*  < > 8.2*  < > 9.0  --   --  8.9 8.9  MG 1.3*  --   --  1.4*  --   --   --   --   --   --   PHOS 3.3 3.2  --   --   --   --   --   --   --   --   < > = values in this interval not displayed.  Liver Function Tests:  Recent Labs  04/07/13 1339 05/22/13 1225 06/08/13 0650  AST 18 25 26   ALT 10 24 18   ALKPHOS 283* 717* 687*  BILITOT 0.5 0.1* 0.3  PROT 6.0 7.0 5.8*  ALBUMIN 2.6* 3.0* 2.7*    CBC:  Recent Labs  04/07/13 1339  05/22/13 1225  06/06/13 1735 06/07/13 0500 06/08/13 0650 06/09/13 0555  WBC 4.7  < > 5.5  < > 4.1 3.9* 5.3 5.4  NEUTROABS 3.1  --  3.4  --  2.6  --   --   --   HGB 9.0*  < > 8.8*  < > 7.3* 6.5* 7.7* 7.9*  HCT 26.7*  < > 26.3*  < > 21.2* 19.4* 22.4* 23.3*  MCV 76.9*  < > 77.8*  < > 77.9* 78.2 78.3 78.7  PLT 402*  < > 369  < > 293 293 268 264  < > = values in this interval not displayed.  Assessment/Plan 1. Prostate cancer metastatic to multiple sites --Hospice referral to [facility] was placed today as he has now completed therapy and it's been difficult to control his pain --does seem pain is managed at this time, but prognosis remains quite poor as he continues to lose weight and have poor po intake  2. Chronic systolic CHF (congestive heart failure) --stable at this time --has had severe edema and dyspnea previously during exacerbation b/c he is not adherent with fluid restriction and low sodium foods--not problematic recently b/c his general intake has been poor  3. Weakness generalized --due  to metastatic prostate cancer with chronic pain and failure to thrive --again, was referred now for hospice care  Family/ staff Communication: discussed his care with  hospice, nursing staff Goals of care: DNR, hospice care Labs/tests ordered:  Hospice referral

## 2013-07-21 ENCOUNTER — Encounter: Payer: Self-pay | Admitting: Internal Medicine

## 2013-07-23 ENCOUNTER — Telehealth: Payer: Self-pay | Admitting: Radiation Oncology

## 2013-07-23 NOTE — Telephone Encounter (Signed)
Faxed EOT 02/20/13 to Hospice at [facility]  OK per [facility].  Received confirmation.

## 2013-08-19 ENCOUNTER — Other Ambulatory Visit: Payer: Self-pay | Admitting: *Deleted

## 2013-08-19 MED ORDER — OXYCODONE-ACETAMINOPHEN 5-325 MG PO TABS: ORAL_TABLET | ORAL | Status: DC

## 2013-08-20 ENCOUNTER — Other Ambulatory Visit: Payer: Self-pay

## 2013-08-20 MED ORDER — OXYCODONE-ACETAMINOPHEN 5-325 MG PO TABS: ORAL_TABLET | ORAL | Status: DC

## 2013-08-20 NOTE — Telephone Encounter (Signed)
Verified dose and instructions reflect manual request received by nursing home.   

## 2013-08-23 ENCOUNTER — Other Ambulatory Visit: Payer: Self-pay | Admitting: *Deleted

## 2013-08-23 MED ORDER — OXYCODONE-ACETAMINOPHEN 5-325 MG PO TABS: ORAL_TABLET | ORAL | Status: AC

## 2013-08-23 MED ORDER — OXYCODONE-ACETAMINOPHEN 5-325 MG PO TABS: ORAL_TABLET | ORAL | Status: DC

## 2013-08-26 ENCOUNTER — Non-Acute Institutional Stay (SKILLED_NURSING_FACILITY): Payer: Medicare Other | Admitting: Internal Medicine

## 2013-08-26 ENCOUNTER — Encounter: Payer: Self-pay | Admitting: Internal Medicine

## 2013-08-26 DIAGNOSIS — I5022 Chronic systolic (congestive) heart failure: Secondary | ICD-10-CM

## 2013-08-26 DIAGNOSIS — E119 Type 2 diabetes mellitus without complications: Secondary | ICD-10-CM

## 2013-08-26 DIAGNOSIS — C800 Disseminated malignant neoplasm, unspecified: Secondary | ICD-10-CM

## 2013-08-26 DIAGNOSIS — C61 Malignant neoplasm of prostate: Secondary | ICD-10-CM

## 2013-08-26 DIAGNOSIS — I509 Heart failure, unspecified: Secondary | ICD-10-CM

## 2013-08-26 DIAGNOSIS — I10 Essential (primary) hypertension: Secondary | ICD-10-CM

## 2013-08-26 NOTE — Assessment & Plan Note (Signed)
Well controlled without metoprolol

## 2013-08-26 NOTE — Assessment & Plan Note (Signed)
OK  Without meds

## 2013-08-26 NOTE — Assessment & Plan Note (Signed)
Pt is on Lupron ;pt is comfortable

## 2013-08-26 NOTE — Progress Notes (Signed)
MRN: [id number] Name: [name]  Sex: male Age: 78 y.o. DOB: 09-05-1935  PSC #: Ronni Rumble Facility/Room: 106b Level Of Care: SNF Provider: Merrilee Seashore D Emergency Contacts: Extended Emergency Contact Information Primary Emergency Contact: Kubicek,Beverly Address: [address]           [place], [place] [address] [place] Phone: [phone] Relation: Daughter Secondary Emergency Contact: Stana Bunting, [place] Mobile Phone: [phone] Relation: Daughter  Code Status: DNR  Allergies: Aspirin and Feldene  Chief Complaint  Patient presents with  . Medical Managment of Chronic Issues    HPI: Patient is 78 y.o. male who is pallative care for metastatic prostate CA.  Past Medical History  Diagnosis Date  . CAD (coronary artery disease)   . Hypertension   . Obesity   . Dyslipidemia   . Anemia   . H/O hemorrhoids   . H/O: GI bleed   . Rhinitis, allergic   . Fatty liver   . Shoulder dislocation 1970    right  . Myocardial infarction 2007  . History of gout   . Asthma   . Arthritis     "all over my whole body" (03/07/2013)  . Chronic kidney disease (CKD), stage III (moderate)     ckd III  . Carpal tunnel syndrome   . Prostate cancer 02/11/13-02/20/13    metastatic prostate cancer  . Bone metastasis   . Fall at home 04/07/2013  . CHF (congestive heart failure)   . Type II diabetes mellitus   . Foley catheter in place     "Coude" (04/08/2013)    Past Surgical History  Procedure Laterality Date  . Transurethral resection of prostate  06/08/2011, 11/08/06  . Nerve repair Right 1974    "hand" (03/07/2013)  . Cataract extraction w/ intraocular lens  implant, bilateral Bilateral       Medication List       This list is accurate as of: 08/26/13  9:45 PM.  Always use your most recent med list.               acetaminophen 500 MG tablet  Commonly known as:  TYLENOL  Take 500 mg by mouth  2 (two) times daily.     allopurinol 100 MG tablet  Commonly known as:  ZYLOPRIM  Take 200 mg by mouth daily.     furosemide 40 MG tablet  Commonly known as:  LASIX  Take 0.5 tablets (20 mg total) by mouth daily.     leuprolide 7.5 MG injection  Commonly known as:  LUPRON  Inject 7.5 mg into the muscle every 28 (twenty-eight) days.     nitroGLYCERIN 0.4 MG SL tablet  Commonly known as:  NITROSTAT  Place 0.4 mg under the tongue every 5 (five) minutes as needed for chest pain.     omeprazole 40 MG capsule  Commonly known as:  PRILOSEC  Take 20 mg by mouth daily.     oxyCODONE-acetaminophen 5-325 MG per tablet  Commonly known as:  PERCOCET/ROXICET  Take one tablet by mouth every 4 hours for pain     sennosides-docusate sodium 8.6-50 MG tablet  Commonly known as:  SENOKOT-S  Take 1 tablet by mouth daily.     sertraline 100 MG tablet  Commonly known as:  ZOLOFT  Take 100 mg by mouth daily.     sodium bicarbonate 650 MG tablet  Take 1 tablet (650 mg total) by  mouth 2 (two) times daily.     tamsulosin 0.4 MG Caps capsule  Commonly known as:  FLOMAX  Take 0.4 mg by mouth daily.        Meds ordered this encounter  Medications  . sertraline (ZOLOFT) 100 MG tablet    Sig: Take 100 mg by mouth daily.     There is no immunization history on file for this patient.  History  Substance Use Topics  . Smoking status: Former Games developer  . Smokeless tobacco: Never Used     Comment: QUIT "YEARS" AGO  . Alcohol Use: No     Comment: former heavy use    Family history is noncontributory    Review of Systems  DATA OBTAINED: from patient GENERAL: Feels well no fevers, fatigue, appetite changes SKIN: No itching, rash EYES: No eye pain, redness, discharge EARS: No earache, tinnitus, change in hearing NOSE: No congestion, drainage or bleeding  MOUTH/THROAT: No mouth or tooth pain, No sore throat, No difficulty chewing or swallowing  RESPIRATORY: No cough, wheezing,  SOB CARDIAC: No chest pain, palpitations, lower extremity edema  GI: No abdominal pain, No N/V/D or constipation, No heartburn or reflux  GU: No dysuria, frequency or urgency, or incontinence  MUSCULOSKELETAL: No unrelieved bone/joint pain NEUROLOGIC: no c/o   AMBULATION:    Filed Vitals:   08/26/13 1710  BP: 108/58  Pulse: 76  Temp: 97.8 F (36.6 C)  Resp: 20    Physical Exam  GENERAL APPEARANCE: napping, awakens easily. Appropriately groomed. No acute distress.  SKIN: No diaphoresis rash HEENT- unremarkable RESPIRATORY: Breathing is even, unlabored. Lung sounds are clear   CARDIOVASCULAR: Heart RRR no murmurs, rubs or gallops. No peripheral edema.   GASTROINTESTINAL: Abdomen is soft, non-tender, not distended w/ normal bowel sounds GENITOURINARY: Bladder non tender, not distended  MUSCULOSKELETAL: No abnormal joints or musculature NEUROLOGIC: . Cranial nerves 2-12 grossly intact. Moves all extremities no tremor.   Patient Active Problem List   Diagnosis Date Noted  . GERD (gastroesophageal reflux disease) 07/05/2013  . Gout 07/05/2013  . Constipation 07/05/2013  . Protein-calorie malnutrition, severe 06/08/2013  . Acute on chronic renal failure 06/06/2013  . Syncope 06/06/2013  . Abdominal  pain, other specified site 05/22/2013  . Palliative care encounter 04/08/2013  . Pain 04/08/2013  . Chronic systolic CHF (congestive heart failure) 04/07/2013  . CKD (chronic kidney disease) stage 4, GFR 15-29 ml/min 04/07/2013  . H/O: GI bleed 04/07/2013  . Myocardial infarction   . Metastatic prostate cancer to bone.  02/15/2013  . Anemia of chronic disease 02/15/2013  . Weakness generalized 02/15/2013  . Metabolic acidosis 02/11/2013  . Obstructive uropathy 02/11/2013  . Hydronephrosis, bilateral 02/04/2013  . Prostate cancer metastatic to bone 02/04/2013  . Diabetes mellitus   . Hyperkalemia 05/25/2012  . Hyponatremia 05/25/2012  . UTI (lower urinary tract infection)  05/25/2012  . Hypertension   . H/O: gout   . Essential hypertension, benign 12/08/2008      CBC    Component Value Date/Time   WBC 5.4 06/09/2013 0555   WBC 7.1 04/03/2012 1413   RBC 2.96* 06/09/2013 0555   RBC 3.26* 03/07/2013 1609   RBC 4.11* 04/03/2012 1413   HGB 7.9* 06/09/2013 0555   HGB 10.8* 04/03/2012 1413   HCT 23.3* 06/09/2013 0555   HCT 34.6* 04/03/2012 1413   PLT 264 06/09/2013 0555   PLT 278 04/03/2012 1413   MCV 78.7 06/09/2013 0555   MCV 84.2 04/03/2012 1413   LYMPHSABS 1.1  06/06/2013 1735   LYMPHSABS 1.8 04/03/2012 1413   MONOABS 0.2 06/06/2013 1735   MONOABS 0.5 04/03/2012 1413   EOSABS 0.1 06/06/2013 1735   EOSABS 0.3 04/03/2012 1413   BASOSABS 0.0 06/06/2013 1735   BASOSABS 0.1 04/03/2012 1413    CMP     Component Value Date/Time   NA 137 06/09/2013 0555   K 4.0 06/09/2013 0555   CL 101 06/09/2013 0555   CO2 26 06/09/2013 0555   GLUCOSE 84 06/09/2013 0555   BUN 34* 06/09/2013 0555   CREATININE 2.52* 06/09/2013 0555   CREATININE 2.97* 04/05/2013 1107   CALCIUM 8.9 06/09/2013 0555   PROT 5.8* 06/08/2013 0650   ALBUMIN 2.7* 06/08/2013 0650   AST 26 06/08/2013 0650   ALT 18 06/08/2013 0650   ALKPHOS 687* 06/08/2013 0650   BILITOT 0.3 06/08/2013 0650   GFRNONAA 23* 06/09/2013 0555   GFRAA 27* 06/09/2013 0555    Assessment and Plan  Metastatic prostate cancer to bone.  Pt is on Lupron ;pt is comfortable  Hypertension Well controlled without metoprolol  Diabetes mellitus OK  Without meds  Chronic systolic CHF (congestive heart failure) Plan d/c Lasix;BP are low and pt has a tendency towards dry and has poor po intake and diet compliance not a problem now    Margit Hanks, MD

## 2013-08-26 NOTE — Assessment & Plan Note (Signed)
Pt on Lupron;he is not in pain

## 2013-08-26 NOTE — Assessment & Plan Note (Addendum)
Plan d/c Lasix;BP are low and pt has a tendency towards dry and has poor po intake and diet compliance not a problem now

## 2013-09-28 DEATH — deceased

## 2013-10-22 ENCOUNTER — Encounter: Payer: Self-pay | Admitting: Adult Health

## 2013-11-24 ENCOUNTER — Encounter: Payer: Self-pay | Admitting: Internal Medicine

## 2014-08-20 ENCOUNTER — Non-Acute Institutional Stay (INDEPENDENT_AMBULATORY_CARE_PROVIDER_SITE_OTHER): Payer: Medicare Other | Admitting: Family Medicine

## 2014-08-20 DIAGNOSIS — Z593 Problems related to living in residential institution: Secondary | ICD-10-CM

## 2014-08-20 NOTE — Progress Notes (Signed)
Patient ID: [name], male   DOB: 08/12/1935, 79 y.o.   MRN: [id number] Opened note to change primary NH provider to [facility] and added discharge date.

## 2014-09-08 ENCOUNTER — Ambulatory Visit: Payer: Self-pay | Admitting: Radiation Oncology

## 2015-04-22 NOTE — Progress Notes (Signed)
This encounter was created in error - please disregard.

## 2016-04-04 ENCOUNTER — Encounter: Payer: Self-pay | Admitting: Gastroenterology
